# Patient Record
Sex: Female | Born: 1965 | Race: White | Hispanic: No | Smoking: Never smoker
Health system: Southern US, Community
[De-identification: ages and names within clinical notes are randomized; demographics above are authoritative.]

## PROBLEM LIST (undated history)

## (undated) DIAGNOSIS — M1990 Unspecified osteoarthritis, unspecified site: Secondary | ICD-10-CM

## (undated) DIAGNOSIS — G4730 Sleep apnea, unspecified: Secondary | ICD-10-CM

## (undated) DIAGNOSIS — T7840XA Allergy, unspecified, initial encounter: Secondary | ICD-10-CM

## (undated) DIAGNOSIS — F32A Depression, unspecified: Secondary | ICD-10-CM

## (undated) DIAGNOSIS — E079 Disorder of thyroid, unspecified: Secondary | ICD-10-CM

## (undated) DIAGNOSIS — F419 Anxiety disorder, unspecified: Secondary | ICD-10-CM

## (undated) DIAGNOSIS — E119 Type 2 diabetes mellitus without complications: Secondary | ICD-10-CM

## (undated) DIAGNOSIS — F329 Major depressive disorder, single episode, unspecified: Secondary | ICD-10-CM

## (undated) DIAGNOSIS — J45909 Unspecified asthma, uncomplicated: Secondary | ICD-10-CM

## (undated) HISTORY — DX: Depression, unspecified: F32.A

## (undated) HISTORY — DX: Unspecified osteoarthritis, unspecified site: M19.90

## (undated) HISTORY — PX: HERNIA REPAIR: SHX51

## (undated) HISTORY — PX: TUBAL LIGATION: SHX77

## (undated) HISTORY — DX: Anxiety disorder, unspecified: F41.9

## (undated) HISTORY — DX: Type 2 diabetes mellitus without complications: E11.9

## (undated) HISTORY — DX: Unspecified asthma, uncomplicated: J45.909

## (undated) HISTORY — PX: BREAST SURGERY: SHX581

## (undated) HISTORY — DX: Disorder of thyroid, unspecified: E07.9

## (undated) HISTORY — DX: Allergy, unspecified, initial encounter: T78.40XA

## (undated) HISTORY — DX: Sleep apnea, unspecified: G47.30

---
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
## 2001-03-16 HISTORY — PX: REDUCTION MAMMAPLASTY: SUR839

## 2016-03-16 DIAGNOSIS — E872 Acidosis: Secondary | ICD-10-CM

## 2016-03-16 DIAGNOSIS — E8729 Other acidosis: Secondary | ICD-10-CM

## 2016-03-16 HISTORY — DX: Acidosis: E87.2

## 2016-03-16 HISTORY — DX: Other acidosis: E87.29

## 2016-07-17 LAB — HM PAP SMEAR: HM Pap smear: NEGATIVE

## 2016-07-17 LAB — RESULTS CONSOLE HPV: CHL HPV: NEGATIVE

## 2016-07-17 LAB — HM MAMMOGRAPHY

## 2019-04-20 ENCOUNTER — Other Ambulatory Visit: Payer: Self-pay

## 2019-04-20 ENCOUNTER — Encounter: Payer: Self-pay | Admitting: Adult Health

## 2019-04-20 ENCOUNTER — Ambulatory Visit: Payer: Managed Care, Other (non HMO) | Admitting: Adult Health

## 2019-04-20 VITALS — BP 120/86 | HR 95 | Temp 96.9°F | Resp 16 | Ht 72.0 in | Wt 278.6 lb

## 2019-04-20 DIAGNOSIS — F339 Major depressive disorder, recurrent, unspecified: Secondary | ICD-10-CM

## 2019-04-20 DIAGNOSIS — R81 Glycosuria: Secondary | ICD-10-CM

## 2019-04-20 DIAGNOSIS — B353 Tinea pedis: Secondary | ICD-10-CM

## 2019-04-20 DIAGNOSIS — M79644 Pain in right finger(s): Secondary | ICD-10-CM

## 2019-04-20 DIAGNOSIS — Z794 Long term (current) use of insulin: Secondary | ICD-10-CM | POA: Diagnosis not present

## 2019-04-20 DIAGNOSIS — E1165 Type 2 diabetes mellitus with hyperglycemia: Secondary | ICD-10-CM | POA: Diagnosis not present

## 2019-04-20 DIAGNOSIS — Z6837 Body mass index (BMI) 37.0-37.9, adult: Secondary | ICD-10-CM

## 2019-04-20 DIAGNOSIS — E66812 Obesity, class 2: Secondary | ICD-10-CM

## 2019-04-20 DIAGNOSIS — Z920 Personal history of contraception: Secondary | ICD-10-CM

## 2019-04-20 DIAGNOSIS — E039 Hypothyroidism, unspecified: Secondary | ICD-10-CM

## 2019-04-20 DIAGNOSIS — Z0000 Encounter for general adult medical examination without abnormal findings: Secondary | ICD-10-CM | POA: Diagnosis not present

## 2019-04-20 LAB — POCT URINALYSIS DIPSTICK
Bilirubin, UA: NEGATIVE
Blood, UA: NEGATIVE
Glucose, UA: POSITIVE — AB
Ketones, UA: NEGATIVE
Leukocytes, UA: NEGATIVE
Nitrite, UA: NEGATIVE
Protein, UA: NEGATIVE
Spec Grav, UA: 1.01 (ref 1.010–1.025)
Urobilinogen, UA: 0.2 E.U./dL
pH, UA: 5 (ref 5.0–8.0)

## 2019-04-20 LAB — POCT GLYCOSYLATED HEMOGLOBIN (HGB A1C): Hemoglobin A1C: 13.1 % — AB (ref 4.0–5.6)

## 2019-04-20 LAB — POCT UA - MICROALBUMIN: Microalbumin Ur, POC: 50 mg/L

## 2019-04-20 MED ORDER — DULOXETINE HCL 60 MG PO CPEP: 60.0000 mg | ORAL_CAPSULE | Freq: Two times a day (BID) | ORAL | 0 refills | Status: DC

## 2019-04-20 MED ORDER — OZEMPIC (0.25 OR 0.5 MG/DOSE) 2 MG/1.5ML ~~LOC~~ SOPN: 0.2500 mg | PEN_INJECTOR | SUBCUTANEOUS | 0 refills | Status: DC

## 2019-04-20 MED ORDER — INSULIN GLARGINE 100 UNIT/ML ~~LOC~~ SOLN: 35.0000 [IU] | Freq: Two times a day (BID) | SUBCUTANEOUS | 0 refills | Status: DC

## 2019-04-20 MED ORDER — METFORMIN HCL 1000 MG PO TABS: 1000.0000 mg | ORAL_TABLET | Freq: Two times a day (BID) | ORAL | 0 refills | Status: DC

## 2019-04-20 MED ORDER — INSULIN SYRINGES (DISPOSABLE) U-100 1 ML MISC: 1.0000 | Freq: Three times a day (TID) | 0 refills | Status: DC | PRN

## 2019-04-20 MED ORDER — KETOCONAZOLE 2 % EX CREA: 1.0000 "application " | TOPICAL_CREAM | Freq: Every day | CUTANEOUS | 0 refills | Status: AC

## 2019-04-20 NOTE — Progress Notes (Signed)
Patient: Valerie Schmitt, Female    DOB: Sep 28, 1965, 54 y.o.   MRN: [id number] Visit Date: 04/20/2019  Today's Provider: Marcille Buffy, FNP   Chief Complaint  Patient presents with  . New Patient (Initial Visit)   Subjective:    New Patient Valerie Schmitt is a 54 y.o. female who presents today for health maintenance and establish care, patient is a previous patient of Dr. Juliet Rude at [facility]. She feels poorly, patient reports that her blood sugar readings at home have been high and states that her diabetes is not under control.This morning patient reports blood sugar was 582 She reports she is not actively exercising . She reports she is sleeping poorly.Patient reports that she averages 4-5 hours a night of sleep and waking up every hour.   She recently moved from [place] last year and has not established new care.   She took 30 of Lantus this morning. She took Ozempic on Sunday. She has Metformin and she has not taken it or Amaryl. 466 in office this am. Denies fruity breath, abdominal pain. She has not been eating well. She reports readings lowest 260- 400 at home. She reports she skips weeks at a time of medication.   Right pointer finger history she was told has a " joint cyst " on it and it is causing her pain. She reports she saw orthopedics in the past for it. She wants a referral to orthopedics at [facility].   She was hospitilized for ketoacidosis in  2018.   She had endocinologist - she was told Type 2 diabetes. She had Free style libre in past.  She has had a lot of stressors, she got divorced last year, her kids are grown and her husband left. She has been stressed. She denies any homiciadal or suicidal  thoughts or ideations. She had a counselor in [place]. She moved 06/2018. She just bought house in [place]. She is   She has been using athletes foot spray for itching yeast like area on her left foot for a month with no change.  Denies any exposures. History of athletes foot in past she reports.   Denies any recent PAP. IUD in place. No menstraul in 2 years. Denies any spotting or bleeding.  Denies any abnormal PAPS. She will send records.   Denies any rectal pain/ bleeding. - unknown date - repeat in 5 years had polyps.  She has been weeks she has not taken her medications at times for Diabetes  for diabetes as directed as been missing medications.  She has not taken her diabetes medicatiosn this week. Sh ehas one lantus pen left. She does not use her novolog.- she had pens in past. She has ozempic.   Depression/anxiety on Cymbalta - she has been off for three weeks and can tell her anxiety is increasing since stopping. She takes Cymbalta 60mg  BID.    Patient  denies any fever, body aches,chills, rash, chest pain, shortness of breath, nausea, vomiting, or diarrhea.   -----------------------------------------------------------------   Review of Systems  Constitutional: Positive for fatigue.  Endocrine: Positive for polydipsia and polyuria.  Musculoskeletal: Positive for arthralgias, back pain, joint swelling and neck pain.  Skin: Positive for rash (on left side of foot).  Neurological: Positive for headaches.  Psychiatric/Behavioral: Positive for decreased concentration. The patient is nervous/anxious.   All other systems reviewed and are negative.   Social History She   Social History   Socioeconomic  History  . Marital status: Divorced    Spouse name: Not on file  . Number of children: Not on file  . Years of education: Not on file  . Highest education level: Not on file  Occupational History  . Not on file  Tobacco Use  . Smoking status: Not on file  Substance and Sexual Activity  . Alcohol use: Not on file  . Drug use: Not on file  . Sexual activity: Not on file  Other Topics Concern  . Not on file  Social History Narrative  . Not on file   Social Determinants of Health   Financial  Resource Strain:   . Difficulty of Paying Living Expenses: Not on file  Food Insecurity:   . Worried About Charity fundraiser in the Last Year: Not on file  . Ran Out of Food in the Last Year: Not on file  Transportation Needs:   . Lack of Transportation (Medical): Not on file  . Lack of Transportation (Non-Medical): Not on file  Physical Activity:   . Days of Exercise per Week: Not on file  . Minutes of Exercise per Session: Not on file  Stress:   . Feeling of Stress : Not on file  Social Connections:   . Frequency of Communication with Friends and Family: Not on file  . Frequency of Social Gatherings with Friends and Family: Not on file  . Attends Religious Services: Not on file  . Active Member of Clubs or Organizations: Not on file  . Attends Archivist Meetings: Not on file  . Marital Status: Not on file    . Family History  Problem Relation Age of Onset  . Diabetes Father   . Scoliosis Daughter   . Autoimmune disease Daughter   . Sleep apnea Daughter   . Anxiety disorder Daughter   . Depression Daughter   . Personality disorder Daughter   . Osteoporosis Maternal Grandmother   . Alzheimer's disease Paternal Grandmother   . Liver disease Paternal Grandfather     Not on File  Previous Medications   No medications on file    Patient Care Team: Flinchum, Kelby Aline, FNP as PCP - General (Family Medicine)      Objective:   Blood pressure 120/86, pulse 95, temperature (!) 96.9 F (36.1 C), temperature source temporal , resp. rate 16, height 6' (1.829 m), weight 278 lb 9.6 oz (126.4 kg), SpO2 98 %.   Physical Exam Vitals reviewed.  Constitutional:      General: She is not in acute distress.    Appearance: Normal appearance. She is well-developed. She is obese. She is not ill-appearing, toxic-appearing or diaphoretic.     Interventions: She is not intubated. HENT:     Head: Normocephalic and atraumatic.     Right Ear: External ear normal.     Left  Ear: External ear normal.     Nose: Nose normal. No congestion or rhinorrhea.     Mouth/Throat:     Pharynx: No oropharyngeal exudate or posterior oropharyngeal erythema.  Eyes:     General: Lids are normal. No scleral icterus.       Right eye: No discharge.        Left eye: No discharge.     Extraocular Movements: Extraocular movements intact.     Conjunctiva/sclera: Conjunctivae normal.     Right eye: Right conjunctiva is not injected. No exudate or hemorrhage.    Left eye: Left conjunctiva is not injected. No  exudate or hemorrhage.    Pupils: Pupils are equal, round, and reactive to light.  Neck:     Thyroid: No thyroid mass or thyromegaly.     Vascular: Normal carotid pulses. No carotid bruit, hepatojugular reflux or JVD.     Trachea: Trachea and phonation normal. No tracheal tenderness or tracheal deviation.     Meningeal: Brudzinski's sign and Kernig's sign absent.  Cardiovascular:     Rate and Rhythm: Normal rate and regular rhythm.     Pulses: Normal pulses.          Radial pulses are 2+ on the right side and 2+ on the left side.       Dorsalis pedis pulses are 2+ on the right side and 2+ on the left side.       Posterior tibial pulses are 2+ on the right side and 2+ on the left side.     Heart sounds: Normal heart sounds, S1 normal and S2 normal. Heart sounds not distant. No murmur. No friction rub. No gallop.   Pulmonary:     Effort: Pulmonary effort is normal. No tachypnea, bradypnea, accessory muscle usage or respiratory distress. She is not intubated.     Breath sounds: Normal breath sounds. No stridor. No wheezing, rhonchi or rales.  Chest:     Chest wall: No tenderness.  Abdominal:     General: Bowel sounds are normal. There is no distension or abdominal bruit.     Palpations: Abdomen is soft. There is no shifting dullness, fluid wave, hepatomegaly, splenomegaly, mass or pulsatile mass.     Tenderness: There is no abdominal tenderness. There is no guarding or rebound.       Hernia: No hernia is present.  Genitourinary:    Comments: deferred to next visit.   Musculoskeletal:        General: No tenderness or deformity. Normal range of motion.     Cervical back: Full passive range of motion without pain, normal range of motion and neck supple. No edema, erythema or rigidity. No spinous process tenderness or muscular tenderness. Normal range of motion.  Lymphadenopathy:     Head:     Right side of head: No submental, submandibular, tonsillar, preauricular, posterior auricular or occipital adenopathy.     Left side of head: No submental, submandibular, tonsillar, preauricular, posterior auricular or occipital adenopathy.     Cervical: No cervical adenopathy.     Right cervical: No superficial, deep or posterior cervical adenopathy.    Left cervical: No superficial, deep or posterior cervical adenopathy.     Upper Body:     Right upper body: No supraclavicular or pectoral adenopathy.     Left upper body: No supraclavicular or pectoral adenopathy.  Skin:    General: Skin is warm and dry.     Capillary Refill: Capillary refill takes less than 2 seconds.     Coloration: Skin is not pale.     Findings: Rash present. No abrasion, bruising, burn, ecchymosis, erythema, lesion or petechiae.     Nails: There is no clubbing.     Comments: Dry flaky mild moist with  mild erythema to left lateral foot. No drainage. No temperature change.  Pulses normal.   Neurological:     General: No focal deficit present.     Mental Status: She is alert and oriented to person, place, and time.     GCS: GCS eye subscore is 4. GCS verbal subscore is 5. GCS motor subscore is 6.  Cranial Nerves: No cranial nerve deficit.     Sensory: No sensory deficit.     Motor: No weakness, tremor, atrophy, abnormal muscle tone or seizure activity.     Coordination: Coordination normal.     Gait: Gait normal.     Deep Tendon Reflexes: Reflexes are normal and symmetric. Reflexes normal. Babinski  sign absent on the right side. Babinski sign absent on the left side.     Reflex Scores:      Tricep reflexes are 2+ on the right side and 2+ on the left side.      Bicep reflexes are 2+ on the right side and 2+ on the left side.      Brachioradialis reflexes are 2+ on the right side and 2+ on the left side.      Patellar reflexes are 2+ on the right side and 2+ on the left side.      Achilles reflexes are 2+ on the right side and 2+ on the left side. Psychiatric:        Mood and Affect: Mood normal.        Speech: Speech normal.        Behavior: Behavior normal.        Thought Content: Thought content normal.        Judgment: Judgment normal.      Depression Screen No flowsheet data found.    Assessment & Plan:     Routine Health Maintenance and Physical Exam  Exercise Activities and Dietary recommendations Goals   None      There is no immunization history on file for this patient.  Health Maintenance  Topic Date Due  . HIV Screening  01/07/1982  . TETANUS/TDAP  01/07/1986  . PAP SMEAR-Modifier  01/08/1988  . MAMMOGRAM  01/07/2017  . COLONOSCOPY  01/07/2017  . INFLUENZA VACCINE  10/15/2018   Wants novolog pen at next refill. Needs syringes for the bottle she has that she says is in date. She was advised we should give her Novolog 5 additional units today but she declined and said she would take when she gets home.    Discussed health benefits of physical activity, and encouraged her to engage in regular exercise appropriate for her age and condition.   Yearly eye exam and dental exam recommended- gave resources.    1. Type 2 diabetes mellitus with hyperglycemia, with long-term current use of insulin ([place]) She has not been compliant with her medication regimen, not taking Amaryl, Metformin, or Lantus at all. Will discontinue Amaryl. She requests syringes for her Novolog, she has a bottle and never got syringes. She would like to switch to Novolog pen when that  runs our. She knows how to use and administer. Increased Lantus to 35 units twice daily. She has monitoring supplies. She will keep log of fasting blood glucose am, before meals and at bedtime and bring log to next visit in one week. Sooner if readings consistently over 350. Reviewed dangers of untreated hyperglycemia and when to seek care immediately for diabetic ketoacidosis. Signs of hypoglycemia also discussed.   Patient verbalized understanding of increasing her Lantus to 35 units twice daily, previous dose was 30 units twice daily.  She understands the importance of controlled diabetes and the risk of uncontrolled diabetes.  She is to call the office if she has any blood sugars that remain over 300.  She will keep a strict record.  And bring to the office in 1 week.  She will  go for labs.  Meds ordered this encounter  Medications  . DULoxetine (CYMBALTA) 60 MG capsule    Sig: Take 1 capsule (60 mg total) by mouth 2 (two) times daily.    Dispense:  120 capsule    Refill:  0  . insulin glargine (LANTUS) 100 UNIT/ML injection    Sig: Inject 0.35 mLs (35 Units total) into the skin 2 (two) times daily.    Dispense:  10 mL    Refill:  0  . Insulin Syringes, Disposable, U-100 1 ML MISC    Sig: 1 Bag by Does not apply route 3 (three) times daily between meals as needed.    Dispense:  100 each    Refill:  0  . Semaglutide,0.25 or 0.5MG /DOS, (OZEMPIC, 0.25 OR 0.5 MG/DOSE,) 2 MG/1.5ML SOPN    Sig: Inject 0.25 mg into the skin once a week.    Dispense:  1 pen    Refill:  0  . metFORMIN (GLUCOPHAGE) 1000 MG tablet    Sig: Take 1 tablet (1,000 mg total) by mouth 2 (two) times daily with a meal.    Dispense:  120 tablet    Refill:  0  . ketoconazole (NIZORAL) 2 % cream    Sig: Apply 1 application topically daily. To area on left foot.    Dispense:  30 g    Refill:  0    - POCT UA - Microalbumin - POCT urinalysis dipstick - POCT glycosylated hemoglobin (Hb A1C) - HgB A1c - CBC with  Differential/Platelet - Comprehensive Metabolic Panel (CMET) - TSH - Comprehensive Metabolic Panel (CMET) - Lipid Profile - C-peptide - DG Finger Index Right; Future - Ambulatory referral to Endocrinology - Ambulatory referral to diabetic education - Ambulatory referral to Orthopedic Surgery  2. Routine health maintenance Discussed heathy diet, increased activity. Exercise.   - Ambulatory referral to Psychiatry  3. Pain of finger of right hand Will x ray and she will follow back up with orthopedics - since [place] in past. Referral placed.  - DG Finger Index Right; Future - Ambulatory referral to Orthopedic Surgery  4. Glucose found in urine on examination Increase hydration of water, monitor diet, exercise.  No ketones.  - Ambulatory referral to Endocrinology - Ambulatory referral to diabetic education  5. Uncontrolled type 2 diabetes mellitus with hyperglycemia ([place]) As above discussed.  - DG Finger Index Right; Future - Ambulatory referral to Endocrinology  6. Depression, recurrent ([place]) Restart Cymbalta as previously was, has been off x 3 weeks. Discussed black box warnings and when to seek care. Needs divorce counseling.  - Ambulatory referral to diabetic education - Ambulatory referral to Psychiatry  7. Class 2 severe obesity due to excess calories with serious comorbidity and body mass index (BMI) of 37.0 to 37.9 in adult [facility]) As above lifestyle aggressive changes diet and excercises - Ambulatory referral to Endocrinology - Ambulatory referral to diabetic education  8. Tinea pedis of left foot Ketoconazole cream given stop athletes spray. Return if not improving in 3- 4 weeks.   9. Hypothyroidism, unspecified type Will check TSH and refill medication after labs .  10. History of use of contraceptive intrauterine device (IUD) Need records she is unsure what she has and also when last PAP was - records release obtained.    Return in about 1  week (around 04/27/2019), or if symptoms worsen or fail to improve, for at any time for any worsening symptoms, Go to Emergency room/ urgent  care if worse. Bring blood glucose logs. Call per parameters given if after hours go to the ER or if any signs of ketoacidosis go directly to ER.   Advised patient call the office or your primary care doctor for an appointment if no improvement within 72 hours or if any symptoms change or worsen at any time  Advised ER or urgent Care if after hours or on weekend. Call 911 for emergency symptoms at any time.Patinet verbalized understanding of all instructions given/reviewed and treatment plan and has no further questions or concerns at this time.    Discussed plan with Dr. Miguel Aschoff supervising physician, who is in agreement with plan and medication changes as above. --------------------------------------------------------------------

## 2019-04-20 NOTE — Addendum Note (Signed)
Addended by: Doreen Beam on: 04/20/2019 06:54 PM   Modules accepted: Level of Service

## 2019-04-20 NOTE — Patient Instructions (Signed)
Preventing Diabetic Ketoacidosis Diabetic ketoacidosis (DKA) is a life-threatening complication of diabetes (diabetes mellitus). It develops when there is not enough of a hormone called insulin in the body. If the body does not have enough insulin, it cannot divide (break down) sugar (glucose) into usable cells, so it breaks down fats instead. This leads to the production of acids (ketones), which can cause the blood to have too much acid in it (acidosis). DKA is a medical emergency that must be treated at the hospital. You may be more likely to develop DKA if you have type 1 diabetes and you take insulin. You can prevent DKA by working closely with your health care provider to manage your diabetes. What nutrition changes can be made?   Follow your meal plan, as directed by your health care provider or diet and nutrition specialist (dietitian).  Eat healthy meals at about the same time every day. Have healthy snacks between meals.  Avoid not eating for long periods of time. Do not skip meals, especially if you are ill.  Avoid regularly eating foods that contain a lot of sugar. Also avoid drinking alcohol. Sugary food and alcohol increase your risk of high blood glucose (hyperglycemia), which increases your risk for DKA.  Drink enough fluid to keep your urine pale yellow. Dehydration increases your risk for DKA. What actions can I take to lower my risk? To lower your risk for diabetic ketoacidosis, manage your diabetes as directed by your health care provider:  Take insulin and other diabetes medicines as directed.  Check your blood glucose every day, as often as directed.  Follow your sick day plan whenever you cannot eat or drink as usual. Make this plan in advance with your health care provider.  Check your urine for ketones as often as directed. ? During times when you are sick, check your ketones every 4-6 hours. ? If you develop symptoms of DKA, check your ketones right away.  If you  have ketones in your urine: ? Contact your health care provider right away. ? Do not exercise.  Know the symptoms of DKA so that you can get treatment as soon as possible.  Make sure that people at work, home, and school know how to check your blood glucose, in case you are not able to do it yourself.  Carry a medical alert card or wear medical alert jewelry that says that you have diabetes. Why are these changes important? DKA is a warning sign that your diabetes is not being well-controlled. You may need to work with your health care provider to adjust your diabetes management plan. DKA can lead to a serious medical emergency that can be life-threatening. Where to find support For more support with preventing DKA:  Talk with your health care provider.  Consider joining a support group. The American Diabetes Association has an online support community at: community.diabetes.org/home Where to find more information Learn more about preventing DKA from:  American Diabetes Association: www.diabetes.org  American Heart Association: www.heart.org Contact a health care provider if: You develop symptoms of DKA, such as:  Fatigue.  Weight loss.  Excessive thirst.  Light-headedness.  Fruity or sweet-smelling breath.  Excessive urination.  Vision changes.  Confusion or irritability.  Nausea.  Vomiting.  Rapid breathing.  Pain in the abdomen.  Feeling warm in your face (flushed). This may or may not include a reddish color coming to your face. If you develop any of these symptoms, do not wait to see if the symptoms will  go away. Get medical help right away. Call your local emergency services (911 in the [place] Do not drive yourself to the hospital. Summary  DKA may be a warning sign that your diabetes is not being well-controlled. You may need to work with your health care provider to adjust your diabetes management plan.  Preventing high blood glucose and dehydration  helps prevent DKA.  Check your urine for ketones as often as directed. You may need to check more often when your blood glucose level is high and when you are ill.  DKA is a medical emergency. Make sure you know the symptoms so that you can recognize and get treatment right away. This information is not intended to replace advice given to you by your health care provider. Make sure you discuss any questions you have with your health care provider. Document Revised: 06/24/2018 Document Reviewed: 10/01/2016 Elsevier Patient Education  [place]. Diabetic Ketoacidosis Diabetic ketoacidosis is a serious complication of diabetes. This condition develops when there is not enough insulin in the body. Insulin is an hormone that regulates blood sugar levels in the body. Normally, insulin allows glucose to enter the cells in the body. The cells break down glucose for energy. Without enough insulin, the body cannot break down glucose, so it breaks down fats instead. This leads to high blood glucose levels in the body and the production of acids that are called ketones. Ketones are poisonous at high levels. If diabetic ketoacidosis is not treated, it can cause severe dehydration and can lead to a coma or death. What are the causes? This condition develops when a lack of insulin causes the body to break down fats instead of glucose. This may be triggered by:  Stress on the body. This stress is brought on by an illness.  Infection.  Medicines that raise blood glucose levels.  Not taking diabetes medicine.  New onset of type 1 diabetes mellitus. What are the signs or symptoms? Symptoms of this condition include:  Fatigue.  Weight loss.  Excessive thirst.  Light-headedness.  Fruity or sweet-smelling breath.  Excessive urination.  Vision changes.  Confusion or irritability.  Nausea.  Vomiting.  Rapid breathing.  Abdominal pain.  Feeling flushed. How is this diagnosed? This  condition is diagnosed based on your medical history, a physical exam, and blood tests. You may also have a urine test to check for ketones. How is this treated? This condition may be treated with:  Fluid replacement. This may be done to correct dehydration.  Insulin injections. These may be given through the skin or through an IV tube.  Electrolyte replacement. Electrolytes are minerals in your blood. Electrolytes such as potassium and sodium may be given in pill form or through an IV tube.  Antibiotic medicines. These may be prescribed if your condition was caused by an infection. Diabetic ketoacidosis is a serious medical condition. You may need emergency treatment in the hospital to monitor your condition. Follow these instructions at home: Eating and drinking  Drink enough fluids to keep your urine clear or pale yellow.  If you are not able to eat, drink clear fluids in small amounts as you are able. Clear fluids include water, ice chips, fruit juice with water added (diluted), and low-calorie sports drinks. You may also have sugar-free jello or popsicles.  If you are able to eat, follow your usual diet and drink sugar-free liquids, such as water. Medicines  Take over-the-counter and prescription medicines only as told by your health care  provider.  Continue to take insulin and other diabetes medicines as told by your health care provider.  If you were prescribed an antibiotic, take it as told by your health care provider. Do not stop taking the antibiotic even if you start to feel better. General instructions   Check your urine for ketones when you are ill and as told by your health care provider. ? If your blood glucose is 240 mg/dL (13.3 mmol/L) or higher, check your urine ketones every 4-6 hours.  Check your blood glucose every day, as often as told by your health care provider. ? If your blood glucose is high, drink plenty of fluids. This helps to flush out ketones. ? If  your blood glucose is above your target for 2 tests in a row, contact your health care provider.  Carry a medical alert card or wear medical alert jewelry that says that you have diabetes.  Rest and exercise only as told by your health care provider. Do not exercise when your blood glucose is high and you have ketones in your urine.  If you get sick, call your health care provider and begin treatment quickly. Your body often needs extra insulin to fight an illness. Check your blood glucose every 4-6 hours when you are sick.  Keep all follow-up visits as told by your health care provider. This is important. Contact a health care provider if:  Your blood glucose level is higher than 240 mg/dL (13.3 mmol/L) for 2 days in a row.  You have moderate or large ketones in your urine.  You have a fever.  You cannot eat or drink without vomiting.  You have been vomiting for more than 2 hours.  You continue to have symptoms of diabetic ketoacidosis.  You develop new symptoms. Get help right away if:  Your blood glucose monitor reads "high" even when you are taking insulin.  You faint.  You have chest pain.  You have trouble breathing.  You have sudden trouble speaking or swallowing.  You have vomiting or diarrhea that gets worse after 3 hours.  You are unable to stay awake.  You have trouble thinking.  You are severely dehydrated. Symptoms of severe dehydration include: ? Extreme thirst. ? Dry mouth. ? Rapid breathing. These symptoms may represent a serious problem that is an emergency. Do not wait to see if the symptoms will go away. Get medical help right away. Call your local emergency services (911 in the [place] Do not drive yourself to the hospital. Summary  Diabetic ketoacidosis is a serious complication of diabetes. This condition develops when there is not enough insulin in the body.  This condition is diagnosed based on your medical history, a physical exam, and blood  tests. You may also have a urine test to check for ketones.  Diabetic ketoacidosis is a serious medical condition. You may need emergency treatment in the hospital to monitor your condition.  Contact your health care provider if your blood glucose is higher than 240 mg/dl for 2 days in a row or if you have moderate or large ketones in your urine. This information is not intended to replace advice given to you by your health care provider. Make sure you discuss any questions you have with your health care provider. Document Revised: 04/17/2016 Document Reviewed: 04/06/2016 Elsevier Patient Education  [place]. Diabetes Mellitus and Nutrition, Adult When you have diabetes (diabetes mellitus), it is very important to have healthy eating habits because your blood sugar (glucose)  levels are greatly affected by what you eat and drink. Eating healthy foods in the appropriate amounts, at about the same times every day, can help you:  Control your blood glucose.  Lower your risk of heart disease.  Improve your blood pressure.  Reach or maintain a healthy weight. Every person with diabetes is different, and each person has different needs for a meal plan. Your health care provider may recommend that you work with a diet and nutrition specialist (dietitian) to make a meal plan that is best for you. Your meal plan may vary depending on factors such as:  The calories you need.  The medicines you take.  Your weight.  Your blood glucose, blood pressure, and cholesterol levels.  Your activity level.  Other health conditions you have, such as heart or kidney disease. How do carbohydrates affect me? Carbohydrates, also called carbs, affect your blood glucose level more than any other type of food. Eating carbs naturally raises the amount of glucose in your blood. Carb counting is a method for keeping track of how many carbs you eat. Counting carbs is important to keep your blood glucose at a  healthy level, especially if you use insulin or take certain oral diabetes medicines. It is important to know how many carbs you can safely have in each meal. This is different for every person. Your dietitian can help you calculate how many carbs you should have at each meal and for each snack. Foods that contain carbs include:  Bread, cereal, rice, pasta, and crackers.  Potatoes and corn.  Peas, beans, and lentils.  Milk and yogurt.  Fruit and juice.  Desserts, such as cakes, cookies, ice cream, and candy. How does alcohol affect me? Alcohol can cause a sudden decrease in blood glucose (hypoglycemia), especially if you use insulin or take certain oral diabetes medicines. Hypoglycemia can be a life-threatening condition. Symptoms of hypoglycemia (sleepiness, dizziness, and confusion) are similar to symptoms of having too much alcohol. If your health care provider says that alcohol is safe for you, follow these guidelines:  Limit alcohol intake to no more than 1 drink per day for nonpregnant women and 2 drinks per day for men. One drink equals 12 oz of beer, 5 oz of wine, or 1 oz of hard liquor.  Do not drink on an empty stomach.  Keep yourself hydrated with water, diet soda, or unsweetened iced tea.  Keep in mind that regular soda, juice, and other mixers may contain a lot of sugar and must be counted as carbs. What are tips for following this plan?  Reading food labels  Start by checking the serving size on the "Nutrition Facts" label of packaged foods and drinks. The amount of calories, carbs, fats, and other nutrients listed on the label is based on one serving of the item. Many items contain more than one serving per package.  Check the total grams (g) of carbs in one serving. You can calculate the number of servings of carbs in one serving by dividing the total carbs by 15. For example, if a food has 30 g of total carbs, it would be equal to 2 servings of carbs.  Check the  number of grams (g) of saturated and trans fats in one serving. Choose foods that have low or no amount of these fats.  Check the number of milligrams (mg) of salt (sodium) in one serving. Most people should limit total sodium intake to less than 2,300 mg per day.  Always  check the nutrition information of foods labeled as "low-fat" or "nonfat". These foods may be higher in added sugar or refined carbs and should be avoided.  Talk to your dietitian to identify your daily goals for nutrients listed on the label. Shopping  Avoid buying canned, premade, or processed foods. These foods tend to be high in fat, sodium, and added sugar.  Shop around the outside edge of the grocery store. This includes fresh fruits and vegetables, bulk grains, fresh meats, and fresh dairy. Cooking  Use low-heat cooking methods, such as baking, instead of high-heat cooking methods like deep frying.  Cook using healthy oils, such as olive, canola, or sunflower oil.  Avoid cooking with butter, cream, or high-fat meats. Meal planning  Eat meals and snacks regularly, preferably at the same times every day. Avoid going long periods of time without eating.  Eat foods high in fiber, such as fresh fruits, vegetables, beans, and whole grains. Talk to your dietitian about how many servings of carbs you can eat at each meal.  Eat 4-6 ounces (oz) of lean protein each day, such as lean meat, chicken, fish, eggs, or tofu. One oz of lean protein is equal to: ? 1 oz of meat, chicken, or fish. ? 1 egg. ?  cup of tofu.  Eat some foods each day that contain healthy fats, such as avocado, nuts, seeds, and fish. Lifestyle  Check your blood glucose regularly.  Exercise regularly as told by your health care provider. This may include: ? 150 minutes of moderate-intensity or vigorous-intensity exercise each week. This could be brisk walking, biking, or water aerobics. ? Stretching and doing strength exercises, such as yoga or  weightlifting, at least 2 times a week.  Take medicines as told by your health care provider.  Do not use any products that contain nicotine or tobacco, such as cigarettes and e-cigarettes. If you need help quitting, ask your health care provider.  Work with a Social worker or diabetes educator to identify strategies to manage stress and any emotional and social challenges. Questions to ask a health care provider  Do I need to meet with a diabetes educator?  Do I need to meet with a dietitian?  What number can I call if I have questions?  When are the best times to check my blood glucose? Where to find more information:  American Diabetes Association: diabetes.org  Academy of Nutrition and Dietetics: www.eatright.[employer] of Diabetes and Digestive and Kidney Diseases (NIH): [employer] Summary  A healthy meal plan will help you control your blood glucose and maintain a healthy lifestyle.  Working with a diet and nutrition specialist (dietitian) can help you make a meal plan that is best for you.  Keep in mind that carbohydrates (carbs) and alcohol have immediate effects on your blood glucose levels. It is important to count carbs and to use alcohol carefully. This information is not intended to replace advice given to you by your health care provider. Make sure you discuss any questions you have with your health care provider. Document Revised: 02/12/2017 Document Reviewed: 04/06/2016 Elsevier Patient Education  2020 [place]. Diabetes Mellitus and Exercise Exercising regularly is important for your overall health, especially when you have diabetes (diabetes mellitus). Exercising is not only about losing weight. It has many other health benefits, such as increasing muscle strength and bone density and reducing body fat and stress. This leads to improved fitness, flexibility, and endurance, all of which result in better overall health. Exercise has  additional  benefits for people with diabetes, including:  Reducing appetite.  Helping to lower and control blood glucose.  Lowering blood pressure.  Helping to control amounts of fatty substances (lipids) in the blood, such as cholesterol and triglycerides.  Helping the body to respond better to insulin (improving insulin sensitivity).  Reducing how much insulin the body needs.  Decreasing the risk for heart disease by: ? Lowering cholesterol and triglyceride levels. ? Increasing the levels of good cholesterol. ? Lowering blood glucose levels. What is my activity plan? Your health care provider or certified diabetes educator can help you make a plan for the type and frequency of exercise (activity plan) that works for you. Make sure that you:  Do at least 150 minutes of moderate-intensity or vigorous-intensity exercise each week. This could be brisk walking, biking, or water aerobics. ? Do stretching and strength exercises, such as yoga or weightlifting, at least 2 times a week. ? Spread out your activity over at least 3 days of the week.  Get some form of physical activity every day. ? Do not go more than 2 days in a row without some kind of physical activity. ? Avoid being inactive for more than 30 minutes at a time. Take frequent breaks to walk or stretch.  Choose a type of exercise or activity that you enjoy, and set realistic goals.  Start slowly, and gradually increase the intensity of your exercise over time. What do I need to know about managing my diabetes?   Check your blood glucose before and after exercising. ? If your blood glucose is 240 mg/dL (13.3 mmol/L) or higher before you exercise, check your urine for ketones. If you have ketones in your urine, do not exercise until your blood glucose returns to normal. ? If your blood glucose is 100 mg/dL (5.6 mmol/L) or lower, eat a snack containing 15-20 grams of carbohydrate. Check your blood glucose 15 minutes after the snack to  make sure that your level is above 100 mg/dL (5.6 mmol/L) before you start your exercise.  Know the symptoms of low blood glucose (hypoglycemia) and how to treat it. Your risk for hypoglycemia increases during and after exercise. Common symptoms of hypoglycemia can include: ? Hunger. ? Anxiety. ? Sweating and feeling clammy. ? Confusion. ? Dizziness or feeling light-headed. ? Increased heart rate or palpitations. ? Blurry vision. ? Tingling or numbness around the mouth, lips, or tongue. ? Tremors or shakes. ? Irritability.  Keep a rapid-acting carbohydrate snack available before, during, and after exercise to help prevent or treat hypoglycemia.  Avoid injecting insulin into areas of the body that are going to be exercised. For example, avoid injecting insulin into: ? The arms, when playing tennis. ? The legs, when jogging.  Keep records of your exercise habits. Doing this can help you and your health care provider adjust your diabetes management plan as needed. Write down: ? Food that you eat before and after you exercise. ? Blood glucose levels before and after you exercise. ? The type and amount of exercise you have done. ? When your insulin is expected to peak, if you use insulin. Avoid exercising at times when your insulin is peaking.  When you start a new exercise or activity, work with your health care provider to make sure the activity is safe for you, and to adjust your insulin, medicines, or food intake as needed.  Drink plenty of water while you exercise to prevent dehydration or heat stroke. Drink enough fluid to  keep your urine clear or pale yellow. Summary  Exercising regularly is important for your overall health, especially when you have diabetes (diabetes mellitus).  Exercising has many health benefits, such as increasing muscle strength and bone density and reducing body fat and stress.  Your health care provider or certified diabetes educator can help you make a  plan for the type and frequency of exercise (activity plan) that works for you.  When you start a new exercise or activity, work with your health care provider to make sure the activity is safe for you, and to adjust your insulin, medicines, or food intake as needed. This information is not intended to replace advice given to you by your health care provider. Make sure you discuss any questions you have with your health care provider. Document Revised: 09/24/2016 Document Reviewed: 08/12/2015 Elsevier Patient Education  [place].

## 2019-04-21 ENCOUNTER — Telehealth: Payer: Self-pay | Admitting: Adult Health

## 2019-04-21 ENCOUNTER — Other Ambulatory Visit: Payer: Self-pay

## 2019-04-21 ENCOUNTER — Encounter: Payer: Self-pay | Admitting: Adult Health

## 2019-04-21 LAB — CBC WITH DIFFERENTIAL/PLATELET
Basophils Absolute: 0.1 10*3/uL (ref 0.0–0.2)
Basos: 2 %
EOS (ABSOLUTE): 0.3 10*3/uL (ref 0.0–0.4)
Eos: 4 %
Hematocrit: 48.6 % — ABNORMAL HIGH (ref 34.0–46.6)
Hemoglobin: 17 g/dL — ABNORMAL HIGH (ref 11.1–15.9)
Immature Grans (Abs): 0 10*3/uL (ref 0.0–0.1)
Immature Granulocytes: 1 %
Lymphocytes Absolute: 1.8 10*3/uL (ref 0.7–3.1)
Lymphs: 29 %
MCH: 29.7 pg (ref 26.6–33.0)
MCHC: 35 g/dL (ref 31.5–35.7)
MCV: 85 fL (ref 79–97)
Monocytes Absolute: 0.4 10*3/uL (ref 0.1–0.9)
Monocytes: 6 %
Neutrophils Absolute: 3.6 10*3/uL (ref 1.4–7.0)
Neutrophils: 58 %
Platelets: 255 10*3/uL (ref 150–450)
RBC: 5.72 x10E6/uL — ABNORMAL HIGH (ref 3.77–5.28)
RDW: 12.6 % (ref 11.7–15.4)
WBC: 6.2 10*3/uL (ref 3.4–10.8)

## 2019-04-21 LAB — COMPREHENSIVE METABOLIC PANEL
ALT: 31 IU/L (ref 0–32)
AST: 21 IU/L (ref 0–40)
Albumin/Globulin Ratio: 1.6 (ref 1.2–2.2)
Albumin: 4.4 g/dL (ref 3.8–4.9)
Alkaline Phosphatase: 90 IU/L (ref 39–117)
BUN/Creatinine Ratio: 20 (ref 9–23)
BUN: 14 mg/dL (ref 6–24)
Bilirubin Total: 0.6 mg/dL (ref 0.0–1.2)
CO2: 18 mmol/L — ABNORMAL LOW (ref 20–29)
Calcium: 9.5 mg/dL (ref 8.7–10.2)
Chloride: 97 mmol/L (ref 96–106)
Creatinine, Ser: 0.71 mg/dL (ref 0.57–1.00)
GFR calc Af Amer: 113 mL/min/{1.73_m2} (ref >59)
GFR calc non Af Amer: 98 mL/min/{1.73_m2} (ref >59)
Globulin, Total: 2.7 g/dL (ref 1.5–4.5)
Glucose: 433 mg/dL — ABNORMAL HIGH (ref 65–99)
Potassium: 4.4 mmol/L (ref 3.5–5.2)
Sodium: 135 mmol/L (ref 134–144)
Total Protein: 7.1 g/dL (ref 6.0–8.5)

## 2019-04-21 LAB — HEMOGLOBIN A1C
Est. average glucose Bld gHb Est-mCnc: 324 mg/dL
Hgb A1c MFr Bld: 12.9 % — ABNORMAL HIGH (ref 4.8–5.6)

## 2019-04-21 LAB — LIPID PANEL
Chol/HDL Ratio: 8.5 ratio — ABNORMAL HIGH (ref 0.0–4.4)
Cholesterol, Total: 305 mg/dL — ABNORMAL HIGH (ref 100–199)
HDL: 36 mg/dL — ABNORMAL LOW (ref >39)
Triglycerides: 885 mg/dL (ref 0–149)

## 2019-04-21 LAB — C-PEPTIDE: C-Peptide: 3.9 ng/mL (ref 1.1–4.4)

## 2019-04-21 LAB — TSH: TSH: 2.81 u[IU]/mL (ref 0.450–4.500)

## 2019-04-21 NOTE — Progress Notes (Signed)
Hemoglobin A1c is 12.9, diabetes is out of control with a average fasting glucose over the last 3 months of 324.  Glucose  433. CBC shows Hemoglobin and hematocrit are increased, please verify if patient is taking any supplement with iron in it such as vitamin,-or shakes that may have high iron content?  If so she should stop them and we should repeat this in 1 month.  Question Any history of  elevated hemoglobin in the past.? CMP shows electrolytes within normal range, kidney and liver function within normal range. Thyroid is within range.  Her triglycerides are out of control elevated at 885, this is due to likely to diet and uncontrolled diabetes.  Total cholesterol is 305 due to elevated triglycerides.  HDL good cholesterol is low at 36. Again must emphasize that she must follow a strict diabetic diet, increase exercise daily at least 30 minutes, as well as avoid high cholesterol foods, high saturated foods, starchy carbohydrates, sugary foods and drinks.  Wine, breads and other starches can increase your triglycerides as well.  I can prescribe a prescription fish oil to help lower triglycerides, this is an option for her since her triglycerides are this high.  We can also try diet and exercise, as well as seeing the nutritionist that I referred her to.  Endocrinology referral was also placed.  Keep plan as discussed in office with medications.  Follow-up in 1 week with a record of blood sugars fasting am  and prior to meals.  High triglycerides can lead to pancreatitis, it is very important that we get this number down.  I have added on a direct LDL so that I will know what her bad cholesterol is as well as a amylase and lipase to rule out any pancreatic issues at this time.  Results should be back next week.  I also sent some information to my chart regarding diet for triglycerides and diabetes. Also released this message to my chart.

## 2019-04-21 NOTE — Telephone Encounter (Signed)
Erroneous encounter - opened in error

## 2019-04-22 LAB — AMYLASE: Amylase: 47 U/L (ref 31–110)

## 2019-04-22 LAB — SPECIMEN STATUS REPORT

## 2019-04-22 LAB — LDL CHOLESTEROL, DIRECT: LDL Direct: 133 mg/dL — ABNORMAL HIGH (ref 0–99)

## 2019-04-22 LAB — LIPASE: Lipase: 39 U/L (ref 14–72)

## 2019-04-24 NOTE — Progress Notes (Signed)
Direct LDL is elevated. Amylase and Lipase are normal.  Total cholesterol and LDL elevated.  Discuss lifestyle modification with patient e.g. increase exercise, fiber, fruits, vegetables, lean meat, and omega 3/fish intake and decrease saturated fat.  If patient following strict diet and exercise program already please schedule follow up appointment with primary care physician Continue per instructions at last visit and last call. Recheck labs as directed.

## 2019-04-25 ENCOUNTER — Other Ambulatory Visit: Payer: Self-pay

## 2019-04-25 ENCOUNTER — Ambulatory Visit: Payer: Managed Care, Other (non HMO) | Admitting: Internal Medicine

## 2019-04-25 ENCOUNTER — Encounter: Payer: Self-pay | Admitting: Internal Medicine

## 2019-04-25 VITALS — BP 118/74 | HR 93 | Temp 97.7°F | Ht 72.0 in | Wt 279.8 lb

## 2019-04-25 DIAGNOSIS — E782 Mixed hyperlipidemia: Secondary | ICD-10-CM | POA: Diagnosis not present

## 2019-04-25 DIAGNOSIS — E1165 Type 2 diabetes mellitus with hyperglycemia: Secondary | ICD-10-CM | POA: Diagnosis not present

## 2019-04-25 DIAGNOSIS — Z794 Long term (current) use of insulin: Secondary | ICD-10-CM

## 2019-04-25 LAB — GLUCOSE, POCT (MANUAL RESULT ENTRY): POC Glucose: 172 mg/dl — AB (ref 70–99)

## 2019-04-25 NOTE — Patient Instructions (Addendum)
-   Continue Lantus at 35 units twice daily - Continue Novolog at 5 units with each meal    - Avoid snacking when you can  - Please bring meter on next visit       Choose healthy, lower carb lower calorie snacks: toss salad, vegetables, cottage cheese, peanut butter, low fat cheese / string cheese, lower sodium deli meat, tuna salad or chicken salad     HOW TO TREAT LOW BLOOD SUGARS (Blood sugar LESS THAN 70 MG/DL)  Please follow the RULE OF 15 for the treatment of hypoglycemia treatment (when your (blood sugars are less than 70 mg/dL)    STEP 1: Take 15 grams of carbohydrates when your blood sugar is low, which includes:   3-4 GLUCOSE TABS  OR  3-4 OZ OF JUICE OR REGULAR SODA OR  ONE TUBE OF GLUCOSE GEL     STEP 2: RECHECK blood sugar in 15 MINUTES STEP 3: If your blood sugar is still low at the 15 minute recheck --> then, go back to STEP 1 and treat AGAIN with another 15 grams of carbohydrates.

## 2019-04-25 NOTE — Progress Notes (Signed)
Name: Valerie Schmitt  MRN/ DOB: [id number], 1966-02-04   Age/ Sex: 54 y.o., female    PCP: Doreen Beam, FNP   Reason for Endocrinology Evaluation: Type 2 Diabetes Mellitus     Date of Initial Endocrinology Visit: 04/26/2019     PATIENT IDENTIFIER: [name] is a 54 y.o. female with a past medical history of T2DM and dyslipidemia. The patient presented for initial endocrinology clinic visit on 04/26/2019 for consultative assistance with her diabetes management.    HPI: Valerie Schmitt moved from [place] 06/2018   Diagnosed with T2DM 2016 Prior Medications tried/Intolerance:  Glimepiride Currently checking blood sugars 3-4 x / day,  Started recently   Hypoglycemia episodes : no         Hemoglobin A1c was 12.9% in 04/2019 Patient required assistance for hypoglycemia: no Patient has required hospitalization within the last 1 year from hyper or hypoglycemia: no   She has a diagnosis of DKA in 2018  In terms of diet, the patient eats 3 meals and 2-3 snacks.    HOME DIABETES REGIMEN: Metformin 1000 mg BID Ozempic 0.5 mg weekly  Lantus 35 units BID Novolog 5 units TIDQAC   Statin: yes ACE-I/ARB:no  Prior Diabetic Education: yes    METER DOWNLOAD SUMMARY: Did not bring    DIABETIC COMPLICATIONS: Microvascular complications:    Denies: CKD, neuropathy, retinopathy   Last eye exam: Completed 01/2019  Macrovascular complications:    Denies: CAD, PVD, CVA   PAST HISTORY: Past Medical History:  Past Medical History:  Diagnosis Date  . Allergy   . Anxiety   . Arthritis   . Asthma   . Depression   . Diabetes mellitus without complication ([employer])   . Ketoacidosis 03/2016  . Sleep apnea   . Thyroid disease    Past Surgical History:  Past Surgical History:  Procedure Laterality Date  . ABDOMINAL HYSTERECTOMY    . BREAST SURGERY    . HERNIA REPAIR    . TUBAL LIGATION        Social History:  reports that she has never smoked. She  has never used smokeless tobacco. She reports previous alcohol use. She reports that she does not use drugs. Family History:  Family History  Problem Relation Age of Onset  . Diabetes Father   . Scoliosis Daughter   . Autoimmune disease Daughter   . Sleep apnea Daughter   . Anxiety disorder Daughter   . Depression Daughter   . Personality disorder Daughter   . Osteoporosis Maternal Grandmother   . Alzheimer's disease Paternal Grandmother   . Liver disease Paternal Grandfather      HOME MEDICATIONS: Allergies as of 04/25/2019   No Known Allergies     Medication List       Accurate as of April 25, 2019 11:59 PM. If you have any questions, ask your nurse or doctor.        atorvastatin 80 MG tablet Commonly known as: LIPITOR Take 80 mg by mouth every evening.   DOXYCYCLINE PO Take by mouth.   DULoxetine 60 MG capsule Commonly known as: CYMBALTA Take 1 capsule (60 mg total) by mouth 2 (two) times daily.   insulin glargine 100 UNIT/ML injection Commonly known as: LANTUS Inject 0.35 mLs (35 Units total) into the skin 2 (two) times daily.   Insulin Syringes (Disposable) U-100 1 ML Misc 1 Bag by Does not apply route 3 (three) times daily between meals as needed.   ketoconazole 2 %  cream Commonly known as: NIZORAL Apply 1 application topically daily. To area on left foot.   levothyroxine 88 MCG tablet Commonly known as: SYNTHROID Take 88 mcg by mouth daily.   meloxicam 15 MG tablet Commonly known as: MOBIC Take 15 mg by mouth daily.   metFORMIN 1000 MG tablet Commonly known as: GLUCOPHAGE Take 1 tablet (1,000 mg total) by mouth 2 (two) times daily with a meal.   NOVOLOG [place] Inject 5 Units into the skin. Before meals   OneTouch UltraLink w/Device Kit by Does not apply route 3 (three) times daily.   Ozempic (0.25 or 0.5 MG/DOSE) 2 MG/1.5ML Sopn Generic drug: Semaglutide(0.25 or 0.5MG/DOS) Inject 0.25 mg into the skin once a week.        ALLERGIES: No  Known Allergies   REVIEW OF SYSTEMS: A comprehensive ROS was conducted with the patient and is negative except as per HPI and below:  Review of Systems  Gastrointestinal: Negative for diarrhea and nausea.  Neurological: Negative for tingling and tremors.      OBJECTIVE:   VITAL SIGNS: BP 118/74 (BP Location: Right Arm, Patient Position: Sitting, Cuff Size: Large)   Pulse 93   Temp 97.7 F (36.5 C)   Ht 6' (1.829 m)   Wt 279 lb 12.8 oz (126.9 kg)   SpO2 97%   BMI 37.95 kg/m    PHYSICAL EXAM:  General: Pt appears well and is in NAD  HEENT:  Eyes: External eye exam normal without stare, lid lag or exophthalmos.  EOM intact.    Neck: General: Supple without adenopathy or carotid bruits. Thyroid: Thyroid size normal.  No goiter or nodules appreciated. No thyroid bruit.  Lungs: Clear with good BS bilat with no rales, rhonchi, or wheezes  Heart: RRR with normal S1 and S2 and no gallops; no murmurs; no rub  Abdomen: Normoactive bowel sounds, soft, nontender, without masses or organomegaly palpable  Extremities:  Lower extremities - No pretibial edema. No lesions.  Skin: Normal texture and temperature to palpation. No rash noted. No Acanthosis nigricans/skin tags. No lipohypertrophy.  Neuro: MS is good with appropriate affect, pt is alert and Ox3    DM foot exam: 04/25/2019  The skin of the feet is shows 2 superficial cuts at the plantar surface of the left 2nd and right 2 toes.  The pedal pulses are 2+ on right and 2+ on left. The sensation is intact to a screening 5.07, 10 gram monofilament bilaterally    DATA REVIEWED:  Lab Results  Component Value Date   HGBA1C 12.9 (H) 04/20/2019   HGBA1C 13.1 (A) 04/20/2019   Lab Results  Component Value Date   MICROALBUR 50 04/20/2019   LDLCALC Comment (A) 04/20/2019   CREATININE 0.71 04/20/2019    Lab Results  Component Value Date   CHOL 305 (H) 04/20/2019   HDL 36 (L) 04/20/2019   LDLCALC Comment (A) 04/20/2019    LDLDIRECT 133 (H) 04/20/2019   TRIG 885 (HH) 04/20/2019   CHOLHDL 8.5 (H) 04/20/2019        ASSESSMENT / PLAN / RECOMMENDATIONS:   1) Type 2 Diabetes Mellitus, Poorly controlled, Without complications - Most recent A1c of 12.9 %. Goal A1c < 7.0 %.    Plan: GENERAL:   Poorly controlled diabetes due to suboptimal medical management, since her moved from [place] she has had some interruption in care. I have discussed with the patient the pathophysiology of diabetes. We went over the natural progression of the disease. We talked about  both insulin resistance and insulin deficiency. We stressed the importance of lifestyle changes including diet and exercise. I explained the complications associated with diabetes including retinopathy, nephropathy, neuropathy as well as increased risk of cardiovascular disease. We went over the benefit seen with glycemic control.    I explained to the patient that diabetic patients are at higher than normal risk for amputations.  Discussed pharmacokinetics of basal/bolus insulin and the importance of taking prandial insulin with meals.   We also discussed avoiding sugar-sweetened beverages and snacks, when possible.  I am unable to make changes to her regimen today, due to the fact that she is on an MDI regimen and no glucose data available today.  I explained to the patient the importance of having her meter on next visit as I am not sure if her NovoLog versus Lantus needs adjustment.  I suspect that she will eventually need more NovoLog and less Lantus but will wait on her glucose data.   MEDICATIONS: - Continue Lantus at 35 units twice daily - Continue Novolog at 5 units with each meal  -Continue Metformin 1000 mg twice daily -Continue Ozempic 0.5 mg weekly    EDUCATION / INSTRUCTIONS:  BG monitoring instructions: Patient is instructed to check her blood sugars 4 times a day, before meals and bedtime.  Call [facility] if: BG  persistently < 70 or > 300. . I reviewed the Rule of 15 for the treatment of hypoglycemia in detail with the patient. Literature supplied.   2) Diabetic complications:   Eye: Does not have known diabetic retinopathy.   Neuro/ Feet: Does not have known diabetic peripheral neuropathy.  Renal: Patient does not have known baseline CKD. She is not on an ACEI/ARB at present.Check urine albumin/creatinine ratio yearly starting at time of diagnosis.  3) Mixed Dyslipidemia: TG are 885 mg/dL, LDL also high at 133 mg/dL .  This should improve with good glycemic control.  She is already on Lipitor 80 mg, patient has not been compliant with this in the past, I have encouraged compliance.   Follow-up in 3 months      Signed electronically by: Mack Guise, MD  [facility] Endocrinology  [facility] [address] [place], [place] [address] Phone: [phone] FAX: [phone]   CC: Doreen Beam, [place] [address] Phone: [phone]  Fax: [phone]    Return to Endocrinology clinic as below: Future Appointments  Date Time Provider [place]  04/28/2019  4:00 PM Doreen Beam, FNP [facility]  07/26/2019  9:10 AM Saydi Kobel, Melanie Crazier, MD [facility] None

## 2019-04-26 DIAGNOSIS — E782 Mixed hyperlipidemia: Secondary | ICD-10-CM | POA: Insufficient documentation

## 2019-04-26 DIAGNOSIS — E1169 Type 2 diabetes mellitus with other specified complication: Secondary | ICD-10-CM | POA: Insufficient documentation

## 2019-04-28 ENCOUNTER — Encounter: Payer: Self-pay | Admitting: Adult Health

## 2019-04-28 ENCOUNTER — Ambulatory Visit (INDEPENDENT_AMBULATORY_CARE_PROVIDER_SITE_OTHER): Payer: Managed Care, Other (non HMO) | Admitting: Adult Health

## 2019-04-28 ENCOUNTER — Other Ambulatory Visit: Payer: Self-pay

## 2019-04-28 VITALS — BP 108/60 | HR 100 | Temp 97.1°F | Resp 16 | Wt 279.4 lb

## 2019-04-28 DIAGNOSIS — D582 Other hemoglobinopathies: Secondary | ICD-10-CM

## 2019-04-28 DIAGNOSIS — E1165 Type 2 diabetes mellitus with hyperglycemia: Secondary | ICD-10-CM | POA: Diagnosis not present

## 2019-04-28 DIAGNOSIS — E039 Hypothyroidism, unspecified: Secondary | ICD-10-CM

## 2019-04-28 DIAGNOSIS — E782 Mixed hyperlipidemia: Secondary | ICD-10-CM

## 2019-04-28 DIAGNOSIS — Z794 Long term (current) use of insulin: Secondary | ICD-10-CM

## 2019-04-28 DIAGNOSIS — G4700 Insomnia, unspecified: Secondary | ICD-10-CM | POA: Diagnosis not present

## 2019-04-28 MED ORDER — TRAZODONE HCL 50 MG PO TABS: 25.0000 mg | ORAL_TABLET | Freq: Every evening | ORAL | 0 refills | Status: DC | PRN

## 2019-04-28 NOTE — Patient Instructions (Signed)
Diabetes Mellitus and Nutrition, Adult When you have diabetes (diabetes mellitus), it is very important to have healthy eating habits because your blood sugar (glucose) levels are greatly affected by what you eat and drink. Eating healthy foods in the appropriate amounts, at about the same times every day, can help you:  Control your blood glucose.  Lower your risk of heart disease.  Improve your blood pressure.  Reach or maintain a healthy weight. Every person with diabetes is different, and each person has different needs for a meal plan. Your health care provider may recommend that you work with a diet and nutrition specialist (dietitian) to make a meal plan that is best for you. Your meal plan may vary depending on factors such as:  The calories you need.  The medicines you take.  Your weight.  Your blood glucose, blood pressure, and cholesterol levels.  Your activity level.  Other health conditions you have, such as heart or kidney disease. How do carbohydrates affect me? Carbohydrates, also called carbs, affect your blood glucose level more than any other type of food. Eating carbs naturally raises the amount of glucose in your blood. Carb counting is a method for keeping track of how many carbs you eat. Counting carbs is important to keep your blood glucose at a healthy level, especially if you use insulin or take certain oral diabetes medicines. It is important to know how many carbs you can safely have in each meal. This is different for every person. Your dietitian can help you calculate how many carbs you should have at each meal and for each snack. Foods that contain carbs include:  Bread, cereal, rice, pasta, and crackers.  Potatoes and corn.  Peas, beans, and lentils.  Milk and yogurt.  Fruit and juice.  Desserts, such as cakes, cookies, ice cream, and candy. How does alcohol affect me? Alcohol can cause a sudden decrease in blood glucose (hypoglycemia),  especially if you use insulin or take certain oral diabetes medicines. Hypoglycemia can be a life-threatening condition. Symptoms of hypoglycemia (sleepiness, dizziness, and confusion) are similar to symptoms of having too much alcohol. If your health care provider says that alcohol is safe for you, follow these guidelines:  Limit alcohol intake to no more than 1 drink per day for nonpregnant women and 2 drinks per day for men. One drink equals 12 oz of beer, 5 oz of wine, or 1 oz of hard liquor.  Do not drink on an empty stomach.  Keep yourself hydrated with water, diet soda, or unsweetened iced tea.  Keep in mind that regular soda, juice, and other mixers may contain a lot of sugar and must be counted as carbs. What are tips for following this plan?  Reading food labels  Start by checking the serving size on the "Nutrition Facts" label of packaged foods and drinks. The amount of calories, carbs, fats, and other nutrients listed on the label is based on one serving of the item. Many items contain more than one serving per package.  Check the total grams (g) of carbs in one serving. You can calculate the number of servings of carbs in one serving by dividing the total carbs by 15. For example, if a food has 30 g of total carbs, it would be equal to 2 servings of carbs.  Check the number of grams (g) of saturated and trans fats in one serving. Choose foods that have low or no amount of these fats.  Check the number of  milligrams (mg) of salt (sodium) in one serving. Most people should limit total sodium intake to less than 2,300 mg per day.  Always check the nutrition information of foods labeled as "low-fat" or "nonfat". These foods may be higher in added sugar or refined carbs and should be avoided.  Talk to your dietitian to identify your daily goals for nutrients listed on the label. Shopping  Avoid buying canned, premade, or processed foods. These foods tend to be high in fat, sodium,  and added sugar.  Shop around the outside edge of the grocery store. This includes fresh fruits and vegetables, bulk grains, fresh meats, and fresh dairy. Cooking  Use low-heat cooking methods, such as baking, instead of high-heat cooking methods like deep frying.  Cook using healthy oils, such as olive, canola, or sunflower oil.  Avoid cooking with butter, cream, or high-fat meats. Meal planning  Eat meals and snacks regularly, preferably at the same times every day. Avoid going long periods of time without eating.  Eat foods high in fiber, such as fresh fruits, vegetables, beans, and whole grains. Talk to your dietitian about how many servings of carbs you can eat at each meal.  Eat 4-6 ounces (oz) of lean protein each day, such as lean meat, chicken, fish, eggs, or tofu. One oz of lean protein is equal to: ? 1 oz of meat, chicken, or fish. ? 1 egg. ?  cup of tofu.  Eat some foods each day that contain healthy fats, such as avocado, nuts, seeds, and fish. Lifestyle  Check your blood glucose regularly.  Exercise regularly as told by your health care provider. This may include: ? 150 minutes of moderate-intensity or vigorous-intensity exercise each week. This could be brisk walking, biking, or water aerobics. ? Stretching and doing strength exercises, such as yoga or weightlifting, at least 2 times a week.  Take medicines as told by your health care provider.  Do not use any products that contain nicotine or tobacco, such as cigarettes and e-cigarettes. If you need help quitting, ask your health care provider.  Work with a Social worker or diabetes educator to identify strategies to manage stress and any emotional and social challenges. Questions to ask a health care provider  Do I need to meet with a diabetes educator?  Do I need to meet with a dietitian?  What number can I call if I have questions?  When are the best times to check my blood glucose? Where to find more  information:  American Diabetes Association: diabetes.org  Academy of Nutrition and Dietetics: www.eatright.[employer] of Diabetes and Digestive and Kidney Diseases (NIH): [employer] Summary  A healthy meal plan will help you control your blood glucose and maintain a healthy lifestyle.  Working with a diet and nutrition specialist (dietitian) can help you make a meal plan that is best for you.  Keep in mind that carbohydrates (carbs) and alcohol have immediate effects on your blood glucose levels. It is important to count carbs and to use alcohol carefully. This information is not intended to replace advice given to you by your health care provider. Make sure you discuss any questions you have with your health care provider. Document Revised: 02/12/2017 Document Reviewed: 04/06/2016 Elsevier Patient Education  [place]. Trazodone tablets What is this medicine? TRAZODONE (TRAZ oh done) is used to treat depression. This medicine may be used for other purposes; ask your health care provider or pharmacist if you have questions. COMMON BRAND NAME(S): Desyrel What  should I tell my health care provider before I take this medicine? They need to know if you have any of these conditions:  attempted suicide or thinking about it  bipolar disorder  bleeding problems  glaucoma  heart disease, or previous heart attack  irregular heart beat  kidney or liver disease  low levels of sodium in the blood  an unusual or allergic reaction to trazodone, other medicines, foods, dyes or preservatives  pregnant or trying to get pregnant  breast-feeding How should I use this medicine? Take this medicine by mouth with a glass of water. Follow the directions on the prescription label. Take this medicine shortly after a meal or a light snack. Take your medicine at regular intervals. Do not take your medicine more often than directed. Do not stop taking this medicine  suddenly except upon the advice of your doctor. Stopping this medicine too quickly may cause serious side effects or your condition may worsen. A special MedGuide will be given to you by the pharmacist with each prescription and refill. Be sure to read this information carefully each time. Talk to your pediatrician regarding the use of this medicine in children. Special care may be needed. Overdosage: If you think you have taken too much of this medicine contact a poison control center or emergency room at once. NOTE: This medicine is only for you. Do not share this medicine with others. What if I miss a dose? If you miss a dose, take it as soon as you can. If it is almost time for your next dose, take only that dose. Do not take double or extra doses. What may interact with this medicine? Do not take this medicine with any of the following medications:  certain medicines for fungal infections like fluconazole, itraconazole, ketoconazole, posaconazole, voriconazole  cisapride  dronedarone  linezolid  MAOIs like Carbex, Eldepryl, Marplan, Nardil, and Parnate  mesoridazine  methylene blue (injected into a vein)  pimozide  saquinavir  thioridazine This medicine may also interact with the following medications:  alcohol  antiviral medicines for HIV or AIDS  aspirin and aspirin-like medicines  barbiturates like phenobarbital  certain medicines for blood pressure, heart disease, irregular heart beat  certain medicines for depression, anxiety, or psychotic disturbances  certain medicines for migraine headache like almotriptan, eletriptan, frovatriptan, naratriptan, rizatriptan, sumatriptan, zolmitriptan  certain medicines for seizures like carbamazepine and phenytoin  certain medicines for sleep  certain medicines that treat or prevent blood clots like dalteparin, enoxaparin, warfarin  digoxin  fentanyl  lithium  NSAIDS, medicines for pain and inflammation, like  ibuprofen or naproxen  other medicines that prolong the QT interval (cause an abnormal heart rhythm) like dofetilide  rasagiline  supplements like St. John's wort, kava kava, valerian  tramadol  tryptophan This list may not describe all possible interactions. Give your health care provider a list of all the medicines, herbs, non-prescription drugs, or dietary supplements you use. Also tell them if you smoke, drink alcohol, or use illegal drugs. Some items may interact with your medicine. What should I watch for while using this medicine? Tell your doctor if your symptoms do not get better or if they get worse. Visit your doctor or health care professional for regular checks on your progress. Because it may take several weeks to see the full effects of this medicine, it is important to continue your treatment as prescribed by your doctor. Patients and their families should watch out for new or worsening thoughts of suicide or depression.  Also watch out for sudden changes in feelings such as feeling anxious, agitated, panicky, irritable, hostile, aggressive, impulsive, severely restless, overly excited and hyperactive, or not being able to sleep. If this happens, especially at the beginning of treatment or after a change in dose, call your health care professional. Dennis Bast may get drowsy or dizzy. Do not drive, use machinery, or do anything that needs mental alertness until you know how this medicine affects you. Do not stand or sit up quickly, especially if you are an older patient. This reduces the risk of dizzy or fainting spells. Alcohol may interfere with the effect of this medicine. Avoid alcoholic drinks. This medicine may cause dry eyes and blurred vision. If you wear contact lenses you may feel some discomfort. Lubricating drops may help. See your eye doctor if the problem does not go away or is severe. Your mouth may get dry. Chewing sugarless gum, sucking hard candy and drinking plenty of water  may help. Contact your doctor if the problem does not go away or is severe. What side effects may I notice from receiving this medicine? Side effects that you should report to your doctor or health care professional as soon as possible:  allergic reactions like skin rash, itching or hives, swelling of the face, lips, or tongue  elevated mood, decreased need for sleep, racing thoughts, impulsive behavior  confusion  fast, irregular heartbeat  feeling faint or lightheaded, falls  feeling agitated, angry, or irritable  loss of balance or coordination  painful or prolonged erections  restlessness, pacing, inability to keep still  suicidal thoughts or other mood changes  tremors  trouble sleeping  seizures  unusual bleeding or bruising Side effects that usually do not require medical attention (report to your doctor or health care professional if they continue or are bothersome):  change in sex drive or performance  change in appetite or weight  constipation  headache  muscle aches or pains  nausea This list may not describe all possible side effects. Call your doctor for medical advice about side effects. You may report side effects to FDA at 1-800-FDA-1088. Where should I keep my medicine? Keep out of the reach of children. Store at room temperature between 15 and 30 degrees C (59 to 86 degrees F). Protect from light. Keep container tightly closed. Throw away any unused medicine after the expiration date. NOTE: This sheet is a summary. It may not cover all possible information. If you have questions about this medicine, talk to your doctor, pharmacist, or health care provider.  2020 Elsevier/Gold Standard (2018-02-22 11:46:46) Insomnia Insomnia is a sleep disorder that makes it difficult to fall asleep or stay asleep. Insomnia can cause fatigue, low energy, difficulty concentrating, mood swings, and poor performance at work or school. There are three different ways  to classify insomnia:  Difficulty falling asleep.  Difficulty staying asleep.  Waking up too early in the morning. Any type of insomnia can be long-term (chronic) or short-term (acute). Both are common. Short-term insomnia usually lasts for three months or less. Chronic insomnia occurs at least three times a week for longer than three months. What are the causes? Insomnia may be caused by another condition, situation, or substance, such as:  Anxiety.  Certain medicines.  Gastroesophageal reflux disease (GERD) or other gastrointestinal conditions.  Asthma or other breathing conditions.  Restless legs syndrome, sleep apnea, or other sleep disorders.  Chronic pain.  Menopause.  Stroke.  Abuse of alcohol, tobacco, or illegal drugs.  Mental  health conditions, such as depression.  Caffeine.  Neurological disorders, such as Alzheimer's disease.  An overactive thyroid (hyperthyroidism). Sometimes, the cause of insomnia may not be known. What increases the risk? Risk factors for insomnia include:  Gender. Women are affected more often than men.  Age. Insomnia is more common as you get older.  Stress.  Lack of exercise.  Irregular work schedule or working night shifts.  Traveling between different time zones.  Certain medical and mental health conditions. What are the signs or symptoms? If you have insomnia, the main symptom is having trouble falling asleep or having trouble staying asleep. This may lead to other symptoms, such as:  Feeling fatigued or having low energy.  Feeling nervous about going to sleep.  Not feeling rested in the morning.  Having trouble concentrating.  Feeling irritable, anxious, or depressed. How is this diagnosed? This condition may be diagnosed based on:  Your symptoms and medical history. Your health care provider may ask about: ? Your sleep habits. ? Any medical conditions you have. ? Your mental health.  A physical  exam. How is this treated? Treatment for insomnia depends on the cause. Treatment may focus on treating an underlying condition that is causing insomnia. Treatment may also include:  Medicines to help you sleep.  Counseling or therapy.  Lifestyle adjustments to help you sleep better. Follow these instructions at home: Eating and drinking   Limit or avoid alcohol, caffeinated beverages, and cigarettes, especially close to bedtime. These can disrupt your sleep.  Do not eat a large meal or eat spicy foods right before bedtime. This can lead to digestive discomfort that can make it hard for you to sleep. Sleep habits   Keep a sleep diary to help you and your health care provider figure out what could be causing your insomnia. Write down: ? When you sleep. ? When you wake up during the night. ? How well you sleep. ? How rested you feel the next day. ? Any side effects of medicines you are taking. ? What you eat and drink.  Make your bedroom a dark, comfortable place where it is easy to fall asleep. ? Put up shades or blackout curtains to block light from outside. ? Use a white noise machine to block noise. ? Keep the temperature cool.  Limit screen use before bedtime. This includes: ? Watching TV. ? Using your smartphone, tablet, or computer.  Stick to a routine that includes going to bed and waking up at the same times every day and night. This can help you fall asleep faster. Consider making a quiet activity, such as reading, part of your nighttime routine.  Try to avoid taking naps during the day so that you sleep better at night.  Get out of bed if you are still awake after 15 minutes of trying to sleep. Keep the lights down, but try reading or doing a quiet activity. When you feel sleepy, go back to bed. General instructions  Take over-the-counter and prescription medicines only as told by your health care provider.  Exercise regularly, as told by your health care  provider. Avoid exercise starting several hours before bedtime.  Use relaxation techniques to manage stress. Ask your health care provider to suggest some techniques that may work well for you. These may include: ? Breathing exercises. ? Routines to release muscle tension. ? Visualizing peaceful scenes.  Make sure that you drive carefully. Avoid driving if you feel very sleepy.  Keep all follow-up visits as  told by your health care provider. This is important. Contact a health care provider if:  You are tired throughout the day.  You have trouble in your daily routine due to sleepiness.  You continue to have sleep problems, or your sleep problems get worse. Get help right away if:  You have serious thoughts about hurting yourself or someone else. If you ever feel like you may hurt yourself or others, or have thoughts about taking your own life, get help right away. You can go to your nearest emergency department or call:  Your local emergency services (911 in the [place]  A suicide crisis helpline, such as the [place] at 680-512-4255. This is open 24 hours a day. Summary  Insomnia is a sleep disorder that makes it difficult to fall asleep or stay asleep.  Insomnia can be long-term (chronic) or short-term (acute).  Treatment for insomnia depends on the cause. Treatment may focus on treating an underlying condition that is causing insomnia.  Keep a sleep diary to help you and your health care provider figure out what could be causing your insomnia. This information is not intended to replace advice given to you by your health care provider. Make sure you discuss any questions you have with your health care provider. Document Revised: 02/12/2017 Document Reviewed: 12/10/2016 Elsevier Patient Education  2020 [employer].

## 2019-04-28 NOTE — Progress Notes (Signed)
Patient: Valerie Schmitt Female    DOB: July 08, 1965   54 y.o.   MRN: [id number] Visit Date: 04/28/2019  Today's Provider: Marcille Buffy, FNP   Chief Complaint  Patient presents with  . Diabetes    1 week follow up   Subjective:     HPI  Diabetes Mellitus Type II, Follow-up:   Lab Results  Component Value Date   HGBA1C 12.9 (H) 04/20/2019   HGBA1C 13.1 (A) 04/20/2019    Last seen for diabetes 1 weeks ago.  Management since then includes increasing Lants to 35units BID. She reports excellent compliance with treatment. She is not having side effects.  Current symptoms include visual disturbances and have been worsening.She reports this is due to a wrong glasses prescription and she will follow up with her eye doctor. She reports this is the second time her glasses have been wrong. Denies any headache, floaters or eye pain. Denies any signs of infection  Home blood sugar records: fasting range: 135-300  Episodes of hypoglycemia? no   Current insulin regiment: 35 units Lantus  Most Recent Eye Exam: 01/2019 patient reports Weight trend: stable Prior visit with dietician: No Current exercise: none Current diet habits: not asked  Pertinent Labs:    Component Value Date/Time   CHOL 305 (H) 04/20/2019 1058   TRIG 885 (HH) 04/20/2019 1058   HDL 36 (L) 04/20/2019 1058   LDLCALC Comment (A) 04/20/2019 1058   CREATININE 0.71 04/20/2019 1058    Wt Readings from Last 3 Encounters:  04/25/19 279 lb 12.8 oz (126.9 kg)  04/20/19 278 lb 9.6 oz (126.4 kg)   Patient also has concerns or not sleeping well at night, she is on Cymbalta currently at the day she has been on for the last few years, she has had increased dressers in her life in the last few years.  She would like to try trazodone to help her sleep, is understood that she should also see psychiatry for counseling and additional medication if needed.  Trazodone will be temporarily for sleep.  Patient   denies any fever, body aches,chills, rash, chest pain, shortness of breath, nausea, vomiting, or diarrhea.  She declined Fish oil for triglycerides elevated.  ------------------------------------------------------------------------  No Known Allergies   Current Outpatient Medications:  .  atorvastatin (LIPITOR) 80 MG tablet, Take 80 mg by mouth every evening., Disp: , Rfl:  .  Blood Glucose Monitoring Suppl (ONETOUCH ULTRALINK) w/Device KIT, by Does not apply route 3 (three) times daily., Disp: , Rfl:  .  DOXYCYCLINE PO, Take by mouth., Disp: , Rfl:  .  DULoxetine (CYMBALTA) 60 MG capsule, Take 1 capsule (60 mg total) by mouth 2 (two) times daily., Disp: 120 capsule, Rfl: 0 .  Insulin Aspart (NOVOLOG [place]), Inject 5 Units into the skin. Before meals, Disp: , Rfl:  .  insulin glargine (LANTUS) 100 UNIT/ML injection, Inject 0.35 mLs (35 Units total) into the skin 2 (two) times daily., Disp: 10 mL, Rfl: 0 .  Insulin Syringes, Disposable, U-100 1 ML MISC, 1 Bag by Does not apply route 3 (three) times daily between meals as needed., Disp: 100 each, Rfl: 0 .  ketoconazole (NIZORAL) 2 % cream, Apply 1 application topically daily. To area on left foot., Disp: 30 g, Rfl: 0 .  levothyroxine (SYNTHROID) 88 MCG tablet, Take 88 mcg by mouth daily., Disp: , Rfl:  .  meloxicam (MOBIC) 15 MG tablet, Take 15 mg by mouth daily., Disp: , Rfl:  .  metFORMIN (GLUCOPHAGE) 1000 MG tablet, Take 1 tablet (1,000 mg total) by mouth 2 (two) times daily with a meal., Disp: 120 tablet, Rfl: 0 .  Semaglutide,0.25 or 0.5MG/DOS, (OZEMPIC, 0.25 OR 0.5 MG/DOSE,) 2 MG/1.5ML SOPN, Inject 0.25 mg into the skin once a week., Disp: 1 pen, Rfl: 0  Review of Systems  Constitutional: Positive for fatigue. Negative for activity change, appetite change, chills, diaphoresis, fever and unexpected weight change.  Respiratory: Negative.   Cardiovascular: Negative.   Gastrointestinal: Negative.   Endocrine: Positive for polyphagia and  polyuria. Negative for cold intolerance, heat intolerance and polydipsia.       She did follow-up with endocrinology, her blood sugars are running much improved since she started back her medication regimen.  She had been occasionally taking her medicines before her visit with me.  No other medication changes were made with endocrinology.  Patient reports she has had blood sugars as noted in HPI.  Genitourinary: Negative.   Musculoskeletal: Negative.   Skin: Negative.   Neurological: Negative.   Psychiatric/Behavioral: Positive for sleep disturbance. Negative for agitation, behavioral problems, confusion, decreased concentration, dysphoric mood, hallucinations, self-injury and suicidal ideas. The patient is not nervous/anxious and is not hyperactive.     Social History   Tobacco Use  . Smoking status: Never Smoker  . Smokeless tobacco: Never Used  Substance Use Topics  . Alcohol use: Not Currently      Objective:  Blood pressure 108/60, pulse 100, temperature (!) 97.1 F (36.2 C), temperature source Oral, resp. rate 16, weight 279 lb 6.4 oz (126.7 kg), SpO2 99 %.   Physical Exam Vitals reviewed.  Constitutional:      General: She is not in acute distress.    Appearance: She is obese. She is not ill-appearing, toxic-appearing or diaphoretic.  HENT:     Head: Normocephalic and atraumatic.     Right Ear: Tympanic membrane normal.     Left Ear: Tympanic membrane normal.     Nose: No congestion or rhinorrhea.     Mouth/Throat:     Mouth: Mucous membranes are moist.     Pharynx: No oropharyngeal exudate or posterior oropharyngeal erythema.  Eyes:     Extraocular Movements: Extraocular movements intact.     Conjunctiva/sclera: Conjunctivae normal.     Pupils: Pupils are equal, round, and reactive to light.  Cardiovascular:     Rate and Rhythm: Normal rate and regular rhythm.     Pulses: Normal pulses.     Heart sounds: Normal heart sounds. No murmur. No friction rub. No gallop.     Pulmonary:     Effort: Pulmonary effort is normal. No respiratory distress.     Breath sounds: Normal breath sounds. No stridor. No wheezing, rhonchi or rales.  Chest:     Chest wall: No tenderness.  Abdominal:     Palpations: Abdomen is soft.  Musculoskeletal:        General: Normal range of motion.  Skin:    General: Skin is warm and dry.     Capillary Refill: Capillary refill takes less than 2 seconds.  Neurological:     Mental Status: She is oriented to person, place, and time.  Psychiatric:        Mood and Affect: Mood normal.        Behavior: Behavior normal.        Thought Content: Thought content normal.        Judgment: Judgment normal.      No results found for  any visits on 04/28/19.     Assessment & Plan     1. Insomnia, unspecified type Sleep hygiene discussed.   Meds ordered this encounter  Medications  . traZODone (DESYREL) 50 MG tablet    Sig: Take 0.5 tablets (25 mg total) by mouth at bedtime as needed for sleep.    Dispense:  30 tablet    Refill:  0  plan to wean off. Discussed possible side effects with Cymbalta and when to seek care. Patient verbalized understanding of all instructions given and denies any further questions at this time.    2. Hypothyroidism, unspecified type Levothyroxine 88 mcg by mouth daily. Recheck as directed - TSH.   3. Type 2 diabetes mellitus with hyperglycemia, with long-term current use of insulin (HCC) Continue current plan, keep log of blood glucose and report any hypoglycemia or hyper glycemia eventa.  Nutrition education given.  - CBC with Differential/Platelet - Comprehensive Metabolic Panel (CMET) Orders Placed This Encounter  Procedures  . CBC with Differential/Platelet  . Comprehensive Metabolic Panel (CMET)  . Lipid Panel w/o Chol/HDL Ratio   4. Elevated triglycerides with high cholesterol Recheck in one month. Diet and exercise. Lifestyle modifications.  - CBC with Differential/Platelet -  Comprehensive Metabolic Panel (CMET) - Lipid Panel w/o Chol/HDL Ratio  She has been referred to diabetes and nutrition for hyperglycemia , DM II and elevated triglycerides and hyperlipidemia.   5. Elevated hemoglobin (HCC) Hemoglobin was 17, hematocrit was 48.6. Denies iron supplement, B 12 or ever being told hemoglobin was elevated. Will recheck in one month from first check.        The entirety of the information documented in the History of Present Illness, Review of Systems and Physical Exam were personally obtained by me. Portions of this information were initially documented by the  Certified Medical Assistant whose name is documented in [place] and reviewed by me for thoroughness and accuracy.  I have personally performed the exam and reviewed the chart and it is accurate to the best of my knowledge.  Haematologist has been used and any errors in dictation or transcription are unintentional.  The entirety of the information documented in the History of Present Illness, Review of Systems and Physical Exam were personally obtained by me. Portions of this information were initially documented by the  Certified Medical Assistant whose name is documented in [place] and reviewed by me for thoroughness and accuracy.  I have personally performed the exam and reviewed the chart and it is accurate to the best of my knowledge.  Haematologist has been used and any errors in dictation or transcription are unintentional.  Kelby Aline. [place]. Wink, [facility]

## 2019-05-01 DIAGNOSIS — D582 Other hemoglobinopathies: Secondary | ICD-10-CM | POA: Insufficient documentation

## 2019-05-24 ENCOUNTER — Other Ambulatory Visit: Payer: Self-pay | Admitting: Adult Health

## 2019-05-24 NOTE — Telephone Encounter (Signed)
Requested medication (s) are due for refill today -yes  Requested medication (s) are on the active medication list -yes  Future visit scheduled -yes  Last refill: -04/20/19  Notes to clinic: Patient is requesting a historical med. Sent for review   Requested Prescriptions  Pending Prescriptions Disp Refills   BD INSULIN SYRINGE U/F 31G X 5/16" 1 ML MISC [Pharmacy Med Name: BD INSULIN SYR UF 1 ML [id number]]      Sig: 1 BAG BY DOES NOT APPLY ROUTE 3 (THREE) TIMES DAILY BETWEEN MEALS AS NEEDED.      There is no refill protocol information for this order        Requested Prescriptions  Pending Prescriptions Disp Refills   BD INSULIN SYRINGE U/F 31G X 5/16" 1 ML MISC [Pharmacy Med Name: BD INSULIN SYR UF 1 ML [id number]]      Sig: 1 BAG BY DOES NOT APPLY ROUTE 3 (THREE) TIMES DAILY BETWEEN MEALS AS NEEDED.      There is no refill protocol information for this order

## 2019-05-29 ENCOUNTER — Other Ambulatory Visit: Payer: Self-pay | Admitting: Adult Health

## 2019-05-29 NOTE — Telephone Encounter (Signed)
Requested Prescriptions  Pending Prescriptions Disp Refills  . Insulin Glargine (BASAGLAR KWIKPEN) 100 UNIT/ML [Pharmacy Med Name: BASAGLAR 100 UNIT/ML KWIKPEN] 30 mL 1    Sig: INJECT 0.35 MLS (35 UNITS TOTAL) INTO THE SKIN 2 (TWO) TIMES DAILY.     Endocrinology:  Diabetes - Insulins Failed - 05/29/2019  7:51 PM      Failed - HBA1C is between 0 and 7.9 and within 180 days    Hemoglobin A1C  Date Value Ref Range Status  04/20/2019 13.1 (A) 4.0 - 5.6 % Final   Hgb A1c MFr Bld  Date Value Ref Range Status  04/20/2019 12.9 (H) 4.8 - 5.6 % Final    Comment:             Prediabetes: 5.7 - 6.4          Diabetes: >6.4          Glycemic control for adults with diabetes: <7.0          Passed - Valid encounter within last 6 months    Recent Outpatient Visits          1 month ago Type 2 diabetes mellitus with hyperglycemia, with long-term current use of insulin ([place])   [facility] Flinchum, Kelby Aline, FNP   1 month ago Type 2 diabetes mellitus with hyperglycemia, with long-term current use of insulin ([place])   [facility] Flinchum, Kelby Aline, FNP      Future Appointments            In 1 month Flinchum, Kelby Aline, FNP [facility], [facility]           Patient has an appointment with provider in 1 month.

## 2019-06-07 ENCOUNTER — Encounter: Payer: Self-pay | Admitting: Skilled Nursing Facility1

## 2019-06-07 ENCOUNTER — Encounter: Payer: Managed Care, Other (non HMO) | Attending: Adult Health | Admitting: Skilled Nursing Facility1

## 2019-06-07 ENCOUNTER — Other Ambulatory Visit: Payer: Self-pay

## 2019-06-07 DIAGNOSIS — R81 Glycosuria: Secondary | ICD-10-CM | POA: Insufficient documentation

## 2019-06-07 DIAGNOSIS — Z6837 Body mass index (BMI) 37.0-37.9, adult: Secondary | ICD-10-CM | POA: Insufficient documentation

## 2019-06-07 DIAGNOSIS — F339 Major depressive disorder, recurrent, unspecified: Secondary | ICD-10-CM | POA: Diagnosis not present

## 2019-06-07 DIAGNOSIS — Z713 Dietary counseling and surveillance: Secondary | ICD-10-CM | POA: Insufficient documentation

## 2019-06-07 DIAGNOSIS — Z794 Long term (current) use of insulin: Secondary | ICD-10-CM | POA: Diagnosis not present

## 2019-06-07 DIAGNOSIS — E119 Type 2 diabetes mellitus without complications: Secondary | ICD-10-CM

## 2019-06-07 DIAGNOSIS — E1165 Type 2 diabetes mellitus with hyperglycemia: Secondary | ICD-10-CM | POA: Diagnosis not present

## 2019-06-07 NOTE — Progress Notes (Signed)
Pt states she has had DM since about 2016 or 2017. Pt states she has had a lot of life events in the past year so has been throwing herself into her work to cope.  Pt states she does not wear her C-PAP. Pt states she cannot afford to get a sleep study. Pt states she has not had a hysterectomy she states she still has a uterus. Pt states she does not do well with nutrition and does not plan her meals stating she needs a specific plan.   Pt states she emotionally eats at night. Pt states she checks her blood sugar 1-2 times a day: fasting: 180    And before lunch: unknown. Pt was unable to recall her blood sugar numbers state=ing her number stay between 180-220.  Pt states she bought an insulin travel kit to take her insulin when she is out and eats. Pt understands the pathophysiology of diabetes.  Pt states her daughter is a vegetarian.  Pt states she used to wake up in the middle of the night and eat but has stopped that behavior. Pt states she has some stress wtih the iidea it will be only her one day or her daughters mental health. Pt state she believes she eats because she is lonely. Pt states she is lazy and does not want to cook.   Education Topics: Hyperglycemia Take your glucometer with you everywhere you go Balanced meals and the purpose of structure/consistancy   Handouts: Diabetes book Diabetes cook books Needs Feelings Should I eat Hobby List  Goals: Set up an alarm and stand every 30 minutes while working Create balanced meals eating every 5 hours starting within 1-1.5 hours after waking  Work on emotional eating using needs and feeling sheet, am I hungry flow sheet Find a hobby Limit eating out to once a week

## 2019-06-21 ENCOUNTER — Ambulatory Visit: Payer: Managed Care, Other (non HMO) | Admitting: Skilled Nursing Facility1

## 2019-06-27 ENCOUNTER — Other Ambulatory Visit: Payer: Self-pay | Admitting: Adult Health

## 2019-06-27 MED ORDER — DULOXETINE HCL 60 MG PO CPEP: 60.0000 mg | ORAL_CAPSULE | Freq: Two times a day (BID) | ORAL | 0 refills | Status: DC

## 2019-06-27 MED ORDER — TRAZODONE HCL 50 MG PO TABS: 25.0000 mg | ORAL_TABLET | Freq: Every evening | ORAL | 0 refills | Status: DC | PRN

## 2019-06-27 NOTE — Telephone Encounter (Signed)
Medication Refill - Medication: DULoxetine (CYMBALTA) 60 MG capsule [  traZODone (DESYREL) 50 MG tablet   Has the patient contacted their pharmacy? Yes.   (Agent: If no, request that the patient contact the pharmacy for the refill.) (Agent: If yes, when and what did the pharmacy advise?)  Preferred Pharmacy (with phone number or street name):  [id number] - [place], [address]  [address] [place] [address]  Phone: [phone] Fax: [phone]     Agent: Please be advised that RX refills may take up to 3 business days. We ask that you follow-up with your pharmacy.

## 2019-06-27 NOTE — Telephone Encounter (Signed)
Sent refills. Remind patient she has lab orders in place and is overdue for recheck on her hemoglobin and triglycerides. All of my orders are in and lab walk in.

## 2019-06-27 NOTE — Telephone Encounter (Signed)
Patient has been advised and states that she will make time to get labs drawn soon. KW

## 2019-07-19 ENCOUNTER — Other Ambulatory Visit: Payer: Self-pay | Admitting: Adult Health

## 2019-07-20 LAB — CBC WITH DIFFERENTIAL/PLATELET
Basophils Absolute: 0.1 10*3/uL (ref 0.0–0.2)
Basos: 1 %
EOS (ABSOLUTE): 0.3 10*3/uL (ref 0.0–0.4)
Eos: 4 %
Hematocrit: 47.2 % — ABNORMAL HIGH (ref 34.0–46.6)
Hemoglobin: 15.3 g/dL (ref 11.1–15.9)
Immature Grans (Abs): 0 10*3/uL (ref 0.0–0.1)
Immature Granulocytes: 0 %
Lymphocytes Absolute: 1.7 10*3/uL (ref 0.7–3.1)
Lymphs: 25 %
MCH: 29.4 pg (ref 26.6–33.0)
MCHC: 32.4 g/dL (ref 31.5–35.7)
MCV: 91 fL (ref 79–97)
Monocytes Absolute: 0.5 10*3/uL (ref 0.1–0.9)
Monocytes: 7 %
Neutrophils Absolute: 4.3 10*3/uL (ref 1.4–7.0)
Neutrophils: 63 %
Platelets: 245 10*3/uL (ref 150–450)
RBC: 5.2 x10E6/uL (ref 3.77–5.28)
RDW: 12.4 % (ref 11.7–15.4)
WBC: 6.9 10*3/uL (ref 3.4–10.8)

## 2019-07-20 LAB — COMPREHENSIVE METABOLIC PANEL WITH GFR
ALT: 17 IU/L (ref 0–32)
AST: 14 IU/L (ref 0–40)
Albumin/Globulin Ratio: 2 (ref 1.2–2.2)
Albumin: 4.4 g/dL (ref 3.8–4.9)
Alkaline Phosphatase: 80 IU/L (ref 39–117)
BUN/Creatinine Ratio: 18 (ref 9–23)
BUN: 12 mg/dL (ref 6–24)
Bilirubin Total: 0.7 mg/dL (ref 0.0–1.2)
CO2: 22 mmol/L (ref 20–29)
Calcium: 9.5 mg/dL (ref 8.7–10.2)
Chloride: 97 mmol/L (ref 96–106)
Creatinine, Ser: 0.67 mg/dL (ref 0.57–1.00)
GFR calc Af Amer: 117 mL/min/1.73
GFR calc non Af Amer: 101 mL/min/1.73
Globulin, Total: 2.2 g/dL (ref 1.5–4.5)
Glucose: 243 mg/dL — ABNORMAL HIGH (ref 65–99)
Potassium: 4.2 mmol/L (ref 3.5–5.2)
Sodium: 138 mmol/L (ref 134–144)
Total Protein: 6.6 g/dL (ref 6.0–8.5)

## 2019-07-20 LAB — IRON AND TIBC
Iron Saturation: 35 % (ref 15–55)
Iron: 118 ug/dL (ref 27–159)
Total Iron Binding Capacity: 340 ug/dL (ref 250–450)
UIBC: 222 ug/dL (ref 131–425)

## 2019-07-20 LAB — LIPID PANEL W/O CHOL/HDL RATIO
Cholesterol, Total: 194 mg/dL (ref 100–199)
HDL: 39 mg/dL — ABNORMAL LOW (ref >39)
LDL Chol Calc (NIH): 122 mg/dL — ABNORMAL HIGH (ref 0–99)
Triglycerides: 184 mg/dL — ABNORMAL HIGH (ref 0–149)
VLDL Cholesterol Cal: 33 mg/dL (ref 5–40)

## 2019-07-21 NOTE — Progress Notes (Signed)
Sent to [employer].  Hemoglobin improved to normal range. Hematocrit still mild elevation but improved.  Glucose still elevated 243 keep follow up with endocrinologist.  Triglycerides still elevated but much improved from last, continue diet recommendations and glucose control. Diet and exercise.  LDL bad cholesterol elevated. Total cholesterol now normal.  Iron panel is within normal limits.

## 2019-07-26 ENCOUNTER — Other Ambulatory Visit: Payer: Self-pay

## 2019-07-26 ENCOUNTER — Ambulatory Visit: Payer: Managed Care, Other (non HMO) | Admitting: Internal Medicine

## 2019-07-26 ENCOUNTER — Encounter: Payer: Self-pay | Admitting: Internal Medicine

## 2019-07-26 ENCOUNTER — Encounter: Payer: Self-pay | Admitting: Adult Health

## 2019-07-26 ENCOUNTER — Ambulatory Visit: Payer: Managed Care, Other (non HMO) | Admitting: Adult Health

## 2019-07-26 VITALS — BP 124/80 | HR 88 | Temp 97.3°F | Resp 16 | Wt 282.8 lb

## 2019-07-26 VITALS — BP 104/72 | HR 98 | Temp 97.7°F | Ht 72.0 in | Wt 280.6 lb

## 2019-07-26 DIAGNOSIS — F339 Major depressive disorder, recurrent, unspecified: Secondary | ICD-10-CM

## 2019-07-26 DIAGNOSIS — E1165 Type 2 diabetes mellitus with hyperglycemia: Secondary | ICD-10-CM | POA: Diagnosis not present

## 2019-07-26 DIAGNOSIS — Z6837 Body mass index (BMI) 37.0-37.9, adult: Secondary | ICD-10-CM

## 2019-07-26 DIAGNOSIS — E785 Hyperlipidemia, unspecified: Secondary | ICD-10-CM

## 2019-07-26 DIAGNOSIS — E1169 Type 2 diabetes mellitus with other specified complication: Secondary | ICD-10-CM | POA: Diagnosis not present

## 2019-07-26 DIAGNOSIS — E559 Vitamin D deficiency, unspecified: Secondary | ICD-10-CM

## 2019-07-26 DIAGNOSIS — Z1231 Encounter for screening mammogram for malignant neoplasm of breast: Secondary | ICD-10-CM

## 2019-07-26 DIAGNOSIS — Z6838 Body mass index (BMI) 38.0-38.9, adult: Secondary | ICD-10-CM

## 2019-07-26 DIAGNOSIS — Z794 Long term (current) use of insulin: Secondary | ICD-10-CM | POA: Diagnosis not present

## 2019-07-26 DIAGNOSIS — Z6836 Body mass index (BMI) 36.0-36.9, adult: Secondary | ICD-10-CM | POA: Insufficient documentation

## 2019-07-26 LAB — POCT GLYCOSYLATED HEMOGLOBIN (HGB A1C): Hemoglobin A1C: 10.4 % — AB (ref 4.0–5.6)

## 2019-07-26 MED ORDER — METFORMIN HCL 1000 MG PO TABS: 1000.0000 mg | ORAL_TABLET | Freq: Two times a day (BID) | ORAL | 3 refills | Status: DC

## 2019-07-26 MED ORDER — INSULIN PEN NEEDLE 31G X 5 MM MISC: 1.0000 | Freq: Four times a day (QID) | 3 refills | Status: DC

## 2019-07-26 MED ORDER — TRAZODONE HCL 50 MG PO TABS: 50.0000 mg | ORAL_TABLET | Freq: Every evening | ORAL | 0 refills | Status: DC | PRN

## 2019-07-26 MED ORDER — INSULIN LISPRO (1 UNIT DIAL) 100 UNIT/ML (KWIKPEN): 10.0000 [IU] | PEN_INJECTOR | Freq: Three times a day (TID) | SUBCUTANEOUS | 6 refills | Status: DC

## 2019-07-26 MED ORDER — OZEMPIC (1 MG/DOSE) 4 MG/3ML ~~LOC~~ SOPN: 1.0000 mg | PEN_INJECTOR | SUBCUTANEOUS | 3 refills | Status: DC

## 2019-07-26 MED ORDER — BASAGLAR KWIKPEN 100 UNIT/ML ~~LOC~~ SOPN: 50.0000 [IU] | PEN_INJECTOR | Freq: Every day | SUBCUTANEOUS | 6 refills | Status: DC

## 2019-07-26 MED ORDER — LEVOTHYROXINE SODIUM 88 MCG PO TABS: 88.0000 ug | ORAL_TABLET | Freq: Every day | ORAL | 1 refills | Status: DC

## 2019-07-26 NOTE — Progress Notes (Signed)
Established patient visit   Patient: Valerie Schmitt   DOB: 17-Jan-1966   54 y.o. Female  MRN: [id number] Visit Date: 07/26/2019  Today's healthcare provider: Marcille Buffy, FNP   Chief Complaint  Patient presents with  . Diabetes   Subjective    HPI  Diabetes Mellitus Type II, Follow-up  Lab Results  Component Value Date   HGBA1C 10.4 (A) 07/26/2019   HGBA1C 12.9 (H) 04/20/2019   HGBA1C 13.1 (A) 04/20/2019   Wt Readings from Last 3 Encounters:  07/26/19 282 lb 12.8 oz (128.3 kg)  07/26/19 280 lb 9.6 oz (127.3 kg)  04/28/19 279 lb 6.4 oz (126.7 kg)   Last seen for diabetes 2 months ago.  Management since then includes none, labs were ordered. She reports excellent compliance with treatment. She is not having side effects.  Symptoms: No fatigue No foot ulcerations  No appetite changes No nausea  No paresthesia (numbness or tingling) of the feet  No polydipsia (excessive thirst)  Yes polyuria (frequent urination) No visual disturbances   No vomiting     Home blood sugar records: patient reports in the 200s  Episodes of hypoglycemia? No     Current insulin regiment: patient reports that she takes 10units of Humalg before meals and takes Lantus 25units twice a day.  Most Recent Eye Exam: patient reports 1 year Current exercise: none Current diet habits: diabetic    She is thinking trazodone is workjing well she does require the 43m tablet PRN for sleep. Doing well she reports with depression and medication. Denies any worsening symptoms states " I feel so much better" . She denies any suicidal or homicidal ideations or intents. She is aware to use Trazodone on ly as needed and risk of serotonin syndrome and signs to watch for.   insulin lispro insulin lispro (HUMALOG KWIKPEN) 100 UNIT/ML KwikPen Inject 0.1 mLs (10 Units total) into the skin 3 (three) times daily., Starting Wed 07/26/2019, Normal  Dispense: 30 mL  Refills: 6 ordered    Pharmacy: [id number] [place] (Ph: [phone] Order Details Ordered on: 07/26/19  Authorizing provider: ShCloyd StagersMD   Basaglar KwikPen Insulin Glargine ([facility] UNIT/ML Inject 0.5 mLs (50 Units total) into the skin daily., Starting Wed 07/26/2019, Normal  Dispense: 30 mL  Refills: 6 ordered  [id number] [place] [address] (Ph: [phone] Details Ordered on: 07/26/19  Authorizing provider: ShaKelton PillarbtMelanie CrazierD  MEDICATIONS: - Continue Metformin 1000 mg TWice a day  - Increase Ozempic to 1 mg weekly  - Decrease Basaglar to 50 units ONCE a day  - Increase Humalog  to 10 units with each meal   Pertinent Labs: Lab Results  Component Value Date   CHOL 194 07/19/2019   HDL 39 (L) 07/19/2019   LDLCALC 122 (H) 07/19/2019   LDLDIRECT 133 (H) 04/20/2019   TRIG 184 (H) 07/19/2019   CHOLHDL 8.5 (H) 04/20/2019   Lab Results  Component Value Date   NA 138 07/19/2019   K 4.2 07/19/2019   CREATININE 0.67 07/19/2019   GFRNONAA 101 07/19/2019   GFRAA 117 07/19/2019   GLUCOSE 243 (H) 07/19/2019     ---------------------------------------------------------------------------------------------------  Patient Active Problem List   Diagnosis Date Noted  . Elevated hemoglobin ([facility]/15/2021  . Elevated triglycerides with high cholesterol 04/26/2019  . Tinea pedis of left foot 04/20/2019  . Class 2 severe  obesity due to excess calories with serious comorbidity and body mass index (BMI) of 37.0 to 37.9 in adult ([place]) 04/20/2019  . Depression, recurrent ([place]) 04/20/2019  . Type 2 diabetes mellitus with hyperglycemia, with long-term current use of insulin ([place]) 04/20/2019  . Glucose found in urine on examination 04/20/2019  . Routine health maintenance 04/20/2019  . Pain of finger of right hand 04/20/2019  . Hypothyroidism 04/20/2019  . History of use of contraceptive  intrauterine device (IUD) 04/20/2019   Past Medical History:  Diagnosis Date  . Allergy   . Anxiety   . Arthritis   . Asthma   . Depression   . Diabetes mellitus without complication ([place])   . Ketoacidosis 03/2016  . Sleep apnea   . Thyroid disease    Social History   Tobacco Use  . Smoking status: Never Smoker  . Smokeless tobacco: Never Used  Substance Use Topics  . Alcohol use: Not Currently  . Drug use: Never   No Known Allergies     Medications: Outpatient Medications Prior to Visit  Medication Sig Note  . atorvastatin (LIPITOR) 80 MG tablet Take 80 mg by mouth every evening.   . Blood Glucose Monitoring Suppl (ONETOUCH ULTRALINK) w/Device KIT by Does not apply route 3 (three) times daily.   . DULoxetine (CYMBALTA) 60 MG capsule Take 1 capsule (60 mg total) by mouth 2 (two) times daily.   Marland Kitchen icosapent Ethyl (VASCEPA) 1 g capsule Take 2 g by mouth 2 (two) times daily.   . Insulin Glargine (BASAGLAR KWIKPEN) 100 UNIT/ML Inject 0.5 mLs (50 Units total) into the skin daily. 07/26/2019: Patient reports 25units twice daily  . insulin lispro (HUMALOG KWIKPEN) 100 UNIT/ML KwikPen Inject 0.1 mLs (10 Units total) into the skin 3 (three) times daily. 07/26/2019: Patient reports 10units daily before meals  . Insulin Pen Needle 31G X 5 MM MISC 1 Device by Does not apply route in the morning, at noon, in the evening, and at bedtime.   Marland Kitchen levothyroxine (SYNTHROID) 88 MCG tablet Take 88 mcg by mouth daily.   . meloxicam (MOBIC) 15 MG tablet Take 15 mg by mouth daily.   . metFORMIN (GLUCOPHAGE) 1000 MG tablet Take 1 tablet (1,000 mg total) by mouth 2 (two) times daily with a meal.   . Semaglutide, 1 MG/DOSE, (OZEMPIC, 1 MG/DOSE,) 4 MG/3ML SOPN Inject 1 mg into the skin once a week.   . traZODone (DESYREL) 50 MG tablet Take 0.5 tablets (25 mg total) by mouth at bedtime as needed for sleep.   . [DISCONTINUED] DOXYCYCLINE PO Take by mouth.    No facility-administered medications prior to  visit.    Review of Systems  Constitutional: Negative.        " fatigue has improved with diabetes management. She saw endocrinology this morning.   HENT: Negative.   Respiratory: Negative.   Cardiovascular: Negative.   Gastrointestinal: Negative.   Genitourinary: Negative.   Musculoskeletal: Negative.   Skin: Negative.   Neurological: Negative.   Hematological: Negative.   Psychiatric/Behavioral: Positive for sleep disturbance. Negative for agitation, behavioral problems, confusion, decreased concentration, dysphoric mood, hallucinations, self-injury and suicidal ideas. The patient is not nervous/anxious and is not hyperactive.     Last metabolic panel Lab Results  Component Value Date   GLUCOSE 243 (H) 07/19/2019   NA 138 07/19/2019   K 4.2 07/19/2019   CL 97 07/19/2019   CO2 22 07/19/2019   BUN 12 07/19/2019   CREATININE 0.67 07/19/2019  GFRNONAA 101 07/19/2019   GFRAA 117 07/19/2019   CALCIUM 9.5 07/19/2019   PROT 6.6 07/19/2019   ALBUMIN 4.4 07/19/2019   LABGLOB 2.2 07/19/2019   AGRATIO 2.0 07/19/2019   BILITOT 0.7 07/19/2019   ALKPHOS 80 07/19/2019   AST 14 07/19/2019   ALT 17 07/19/2019   Last lipids Lab Results  Component Value Date   CHOL 194 07/19/2019   HDL 39 (L) 07/19/2019   LDLCALC 122 (H) 07/19/2019   LDLDIRECT 133 (H) 04/20/2019   TRIG 184 (H) 07/19/2019   CHOLHDL 8.5 (H) 04/20/2019   Last hemoglobin A1c Lab Results  Component Value Date   HGBA1C 10.4 (A) 07/26/2019      Objective    BP 124/80   Pulse 88   Temp (!) 97.3 F (36.3 C) (Oral)   Resp 16   Wt 282 lb 12.8 oz (128.3 kg)   SpO2 99%   BMI 38.35 kg/m  Wt Readings from Last 3 Encounters:  07/26/19 282 lb 12.8 oz (128.3 kg)  07/26/19 280 lb 9.6 oz (127.3 kg)  04/28/19 279 lb 6.4 oz (126.7 kg)      Physical Exam    Results for orders placed or performed in visit on 07/26/19  POCT HgB A1C  Result Value Ref Range   Hemoglobin A1C 10.4 (A) 4.0 - 5.6 %   HbA1c POC (<>  result, manual entry)     HbA1c, POC (prediabetic range)     HbA1c, POC (controlled diabetic range)      Assessment & Plan     Body mass index (BMI) of 38.0-38.9 in adult  Depression, recurrent (HCC), Chronic  Class 2 severe obesity due to excess calories with serious comorbidity and body mass index (BMI) of 37.0 to 37.9 in adult [facility]), Chronic - Plan: Lipid Panel w/o Chol/HDL Ratio  Hyperlipidemia associated with type 2 diabetes mellitus ([place]) - Plan: Lipid Panel w/o Chol/HDL Ratio, CBC with Differential/Platelet, Comprehensive Metabolic Panel (CMET)  Vitamin D insufficiency - Plan: VITAMIN D 25 Hydroxy (Vit-D Deficiency, Fractures)  Encounter for screening mammogram for malignant neoplasm of breast - Plan: MM Digital Screening  Refills given.  Meds ordered this encounter  Medications  . traZODone (DESYREL) 50 MG tablet    Sig: Take 1 tablet (50 mg total) by mouth at bedtime as needed for sleep.    Dispense:  90 tablet    Refill:  0  . levothyroxine (SYNTHROID) 88 MCG tablet    Sig: Take 1 tablet (88 mcg total) by mouth daily before breakfast.    Dispense:  90 tablet    Refill:  1   Continue to stay on top of diabetes control, monitor for hypoglycemia and hyperglycemia events.  Keep endocrinology appointments.   Orders Placed This Encounter  Procedures  . MM Digital Screening    Order Specific Question:   Reason for Exam (SYMPTOM  OR DIAGNOSIS REQUIRED)    Answer:   screening mammogram    Order Specific Question:   Is the patient pregnant?    Answer:   No    Order Specific Question:   Preferred imaging location?    Answer:   [facility]  . Lipid Panel w/o Chol/HDL Ratio    Standing Status:   Future    Standing Expiration Date:   07/25/2020  . CBC with Differential/Platelet    Standing Status:   Future    Standing Expiration Date:   01/26/2020  . Comprehensive Metabolic Panel (CMET)    Standing Status:  Future    Standing Expiration Date:   07/25/2020  .  VITAMIN D 25 Hydroxy (Vit-D Deficiency, Fractures)    Call to schedule mammogram. She will have PAP smear at next visit and does not want today.   Return in about 6 months (around 01/26/2020), or if symptoms worsen or fail to improve, for at any time for any worsening symptoms, Go to Emergency room/ urgent care if worse.   Advised patient call the office or your primary care doctor for an appointment if no improvement within 72 hours or if any symptoms change or worsen at any time  Advised ER or urgent Care if after hours or on weekend. Call 911 for emergency symptoms at any time.Patinet verbalized understanding of all instructions given/reviewed and treatment plan and has no further questions or concerns at this time.      IWellington Hampshire Taleeyah Bora, FNP, have reviewed all documentation for this visit. The documentation on 07/26/19 for the exam, diagnosis, procedures, and orders are all accurate and complete.   Marcille Buffy, [place] [phone] (phone) [phone] (fax)  [place]

## 2019-07-26 NOTE — Patient Instructions (Addendum)
Fat and Cholesterol Restricted Eating Plan Getting too much fat and cholesterol in your diet may cause health problems. Choosing the right foods helps keep your fat and cholesterol at normal levels. This can keep you from getting certain diseases. Your doctor may recommend an eating plan that includes:  Total fat: ______% or less of total calories a day.  Saturated fat: ______% or less of total calories a day.  Cholesterol: less than _________mg a day.  Fiber: ______g a day. What are tips for following this plan? Meal planning  At meals, divide your plate into four equal parts: ? Fill one-half of your plate with vegetables and green salads. ? Fill one-fourth of your plate with whole grains. ? Fill one-fourth of your plate with low-fat (lean) protein foods.  Eat fish that is high in omega-3 fats at least two times a week. This includes mackerel, tuna, sardines, and salmon.  Eat foods that are high in fiber, such as whole grains, beans, apples, broccoli, carrots, peas, and barley. General tips   Work with your doctor to lose weight if you need to.  Avoid: ? Foods with added sugar. ? Fried foods. ? Foods with partially hydrogenated oils.  Limit alcohol intake to no more than 1 drink a day for nonpregnant women and 2 drinks a day for men. One drink equals 12 oz of beer, 5 oz of wine, or 1 oz of hard liquor. Reading food labels  Check food labels for: ? Trans fats. ? Partially hydrogenated oils. ? Saturated fat (g) in each serving. ? Cholesterol (mg) in each serving. ? Fiber (g) in each serving.  Choose foods with healthy fats, such as: ? Monounsaturated fats. ? Polyunsaturated fats. ? Omega-3 fats.  Choose grain products that have whole grains. Look for the word "whole" as the first word in the ingredient list. Cooking  Cook foods using low-fat methods. These include baking, boiling, grilling, and broiling.  Eat more home-cooked foods. Eat at restaurants and  buffets less often.  Avoid cooking using saturated fats, such as butter, cream, palm oil, palm kernel oil, and coconut oil. Recommended foods  Fruits  All fresh, canned (in natural juice), or frozen fruits. Vegetables  Fresh or frozen vegetables (raw, steamed, roasted, or grilled). Green salads. Grains  Whole grains, such as whole wheat or whole grain breads, crackers, cereals, and pasta. Unsweetened oatmeal, bulgur, barley, quinoa, or brown rice. Corn or whole wheat flour tortillas. Meats and other protein foods  Ground beef (85% or leaner), grass-fed beef, or beef trimmed of fat. Skinless chicken or [place]. Ground chicken or [place]. Pork trimmed of fat. All fish and seafood. Egg whites. Dried beans, peas, or lentils. Unsalted nuts or seeds. Unsalted canned beans. Nut butters without added sugar or oil. Dairy  Low-fat or nonfat dairy products, such as skim or 1% milk, 2% or reduced-fat cheeses, low-fat and fat-free ricotta or cottage cheese, or plain low-fat and nonfat yogurt. Fats and oils  Tub margarine without trans fats. Light or reduced-fat mayonnaise and salad dressings. Avocado. Olive, canola, sesame, or safflower oils. The items listed above may not be a complete list of foods and beverages you can eat. Contact a dietitian for more information. Foods to avoid Fruits  Canned fruit in heavy syrup. Fruit in cream or butter sauce. Fried fruit. Vegetables  Vegetables cooked in cheese, cream, or butter sauce. Fried vegetables. Grains  White bread. White pasta. White rice. Cornbread. Bagels, pastries, and croissants. Crackers and snack foods that contain trans  fat and hydrogenated oils. Meats and other protein foods  Fatty cuts of meat. Ribs, chicken wings, bacon, sausage, bologna, salami, chitterlings, fatback, hot dogs, bratwurst, and packaged lunch meats. Liver and organ meats. Whole eggs and egg yolks. Chicken and [place] with skin. Fried meat. Dairy  Whole or 2% milk,  cream, half-and-half, and cream cheese. Whole milk cheeses. Whole-fat or sweetened yogurt. Full-fat cheeses. Nondairy creamers and whipped toppings. Processed cheese, cheese spreads, and cheese curds. Beverages  Alcohol. Sugar-sweetened drinks such as sodas, lemonade, and fruit drinks. Fats and oils  Butter, stick margarine, lard, shortening, ghee, or bacon fat. Coconut, palm kernel, and palm oils. Sweets and desserts  Corn syrup, sugars, honey, and molasses. Candy. Jam and jelly. Syrup. Sweetened cereals. Cookies, pies, cakes, donuts, muffins, and ice cream. The items listed above may not be a complete list of foods and beverages you should avoid. Contact a dietitian for more information. Summary  Choosing the right foods helps keep your fat and cholesterol at normal levels. This can keep you from getting certain diseases.  At meals, fill one-half of your plate with vegetables and green salads.  Eat high-fiber foods, like whole grains, beans, apples, carrots, peas, and barley.  Limit added sugar, saturated fats, alcohol, and fried foods. This information is not intended to replace advice given to you by your health care provider. Make sure you discuss any questions you have with your health care provider. Document Revised: 11/03/2017 Document Reviewed: 11/17/2016 Elsevier Patient Education  2020 [employer]. Call to schedule your screening mammogram. Your orders have been placed for your exam.  Let our office know if you have questions, concerns, or any difficulty scheduling.  If normal results then yearly screening mammograms are recommended unless you notice  Changes in your breast then you should schedule a follow up office visit. If abnormal results  Further imaging will be warranted and sooner follow up as determined by the radiologist at the [facility].   [facility] [place], [place] [address]  Main: [phone]    Pap  at next visit.   Labs fasting in 6 months.   Trazodone tablets What is this medicine? TRAZODONE (TRAZ oh done) is used to treat depression. This medicine may be used for other purposes; ask your health care provider or pharmacist if you have questions. COMMON BRAND NAME(S): Desyrel What should I tell my health care provider before I take this medicine? They need to know if you have any of these conditions:  attempted suicide or thinking about it  bipolar disorder  bleeding problems  glaucoma  heart disease, or previous heart attack  irregular heart beat  kidney or liver disease  low levels of sodium in the blood  an unusual or allergic reaction to trazodone, other medicines, foods, dyes or preservatives  pregnant or trying to get pregnant  breast-feeding How should I use this medicine? Take this medicine by mouth with a glass of water. Follow the directions on the prescription label. Take this medicine shortly after a meal or a light snack. Take your medicine at regular intervals. Do not take your medicine more often than directed. Do not stop taking this medicine suddenly except upon the advice of your doctor. Stopping this medicine too quickly may cause serious side effects or your condition may worsen. A special MedGuide will be given to you by the pharmacist with each prescription and refill. Be sure to read this information carefully each time. Talk  to your pediatrician regarding the use of this medicine in children. Special care may be needed. Overdosage: If you think you have taken too much of this medicine contact a poison control center or emergency room at once. NOTE: This medicine is only for you. Do not share this medicine with others. What if I miss a dose? If you miss a dose, take it as soon as you can. If it is almost time for your next dose, take only that dose. Do not take double or extra doses. What may interact with this medicine? Do not take this medicine  with any of the following medications:  certain medicines for fungal infections like fluconazole, itraconazole, ketoconazole, posaconazole, voriconazole  cisapride  dronedarone  linezolid  MAOIs like Carbex, Eldepryl, Marplan, Nardil, and Parnate  mesoridazine  methylene blue (injected into a vein)  pimozide  saquinavir  thioridazine This medicine may also interact with the following medications:  alcohol  antiviral medicines for HIV or AIDS  aspirin and aspirin-like medicines  barbiturates like phenobarbital  certain medicines for blood pressure, heart disease, irregular heart beat  certain medicines for depression, anxiety, or psychotic disturbances  certain medicines for migraine headache like almotriptan, eletriptan, frovatriptan, naratriptan, rizatriptan, sumatriptan, zolmitriptan  certain medicines for seizures like carbamazepine and phenytoin  certain medicines for sleep  certain medicines that treat or prevent blood clots like dalteparin, enoxaparin, warfarin  digoxin  fentanyl  lithium  NSAIDS, medicines for pain and inflammation, like ibuprofen or naproxen  other medicines that prolong the QT interval (cause an abnormal heart rhythm) like dofetilide  rasagiline  supplements like St. John's wort, kava kava, valerian  tramadol  tryptophan This list may not describe all possible interactions. Give your health care provider a list of all the medicines, herbs, non-prescription drugs, or dietary supplements you use. Also tell them if you smoke, drink alcohol, or use illegal drugs. Some items may interact with your medicine. What should I watch for while using this medicine? Tell your doctor if your symptoms do not get better or if they get worse. Visit your doctor or health care professional for regular checks on your progress. Because it may take several weeks to see the full effects of this medicine, it is important to continue your treatment as  prescribed by your doctor. Patients and their families should watch out for new or worsening thoughts of suicide or depression. Also watch out for sudden changes in feelings such as feeling anxious, agitated, panicky, irritable, hostile, aggressive, impulsive, severely restless, overly excited and hyperactive, or not being able to sleep. If this happens, especially at the beginning of treatment or after a change in dose, call your health care professional. Dennis Bast may get drowsy or dizzy. Do not drive, use machinery, or do anything that needs mental alertness until you know how this medicine affects you. Do not stand or sit up quickly, especially if you are an older patient. This reduces the risk of dizzy or fainting spells. Alcohol may interfere with the effect of this medicine. Avoid alcoholic drinks. This medicine may cause dry eyes and blurred vision. If you wear contact lenses you may feel some discomfort. Lubricating drops may help. See your eye doctor if the problem does not go away or is severe. Your mouth may get dry. Chewing sugarless gum, sucking hard candy and drinking plenty of water may help. Contact your doctor if the problem does not go away or is severe. What side effects may I notice from receiving this medicine?  Side effects that you should report to your doctor or health care professional as soon as possible:  allergic reactions like skin rash, itching or hives, swelling of the face, lips, or tongue  elevated mood, decreased need for sleep, racing thoughts, impulsive behavior  confusion  fast, irregular heartbeat  feeling faint or lightheaded, falls  feeling agitated, angry, or irritable  loss of balance or coordination  painful or prolonged erections  restlessness, pacing, inability to keep still  suicidal thoughts or other mood changes  tremors  trouble sleeping  seizures  unusual bleeding or bruising Side effects that usually do not require medical attention  (report to your doctor or health care professional if they continue or are bothersome):  change in sex drive or performance  change in appetite or weight  constipation  headache  muscle aches or pains  nausea This list may not describe all possible side effects. Call your doctor for medical advice about side effects. You may report side effects to FDA at 1-800-FDA-1088. Where should I keep my medicine? Keep out of the reach of children. Store at room temperature between 15 and 30 degrees C (59 to 86 degrees F). Protect from light. Keep container tightly closed. Throw away any unused medicine after the expiration date. NOTE: This sheet is a summary. It may not cover all possible information. If you have questions about this medicine, talk to your doctor, pharmacist, or health care provider.  2020 Elsevier/Gold Standard (2018-02-22 11:46:46)

## 2019-07-26 NOTE — Patient Instructions (Addendum)
-   Continue Metformin 1000 mg TWice a day  - Increase Ozempic to 1 mg weekly  - Decrease Basaglar to 50 units ONCE a day  - Increase Humalog  to 10 units with each meal      HOW TO TREAT LOW BLOOD SUGARS (Blood sugar LESS THAN 70 MG/DL)  Please follow the RULE OF 15 for the treatment of hypoglycemia treatment (when your (blood sugars are less than 70 mg/dL)    STEP 1: Take 15 grams of carbohydrates when your blood sugar is low, which includes:   3-4 GLUCOSE TABS  OR  3-4 OZ OF JUICE OR REGULAR SODA OR  ONE TUBE OF GLUCOSE GEL     STEP 2: RECHECK blood sugar in 15 MINUTES STEP 3: If your blood sugar is still low at the 15 minute recheck --> then, go back to STEP 1 and treat AGAIN with another 15 grams of carbohydrates.

## 2019-07-26 NOTE — Progress Notes (Signed)
Name: Valerie Schmitt  Age/ Sex: 54 y.o., female   MRN/ DOB: [id number], [birth year]     PCP: Doreen Beam, FNP   Reason for Endocrinology Evaluation: Type 2 Diabetes Mellitus  Initial Endocrine Consultative Visit: 04/26/2019    PATIENT IDENTIFIER: Valerie Schmitt is a 54 y.o. female with a past medical history of T2DM and Dyslipidemia. The patient has followed with Endocrinology clinic since 04/26/2019 for consultative assistance with management of her diabetes.  DIABETIC HISTORY:  Valerie Schmitt was diagnosed with T2DM in 2016. She has been on Glimepiride in the past without reported intolerance.She moved from [place] 06/2018 and records were not available.  Her hemoglobin A1c was 12.9% on initial visit.  On her initial visit to our clinic, her A1c was 12.9%. She was already on MDI regimen , metformin and Ozempic which were continued.   SUBJECTIVE:   During the last visit (04/26/2019): A1c 12.9% . We continued MDI regimen as well as Ozempic and Metformin.     Today (07/26/2019): Valerie Schmitt is here for a follow up on diabetes management.  She checks her blood sugars occasionally preprandial . The patient has not  had hypoglycemic episodes since the last clinic visit.  Takes Novolog mainly before dinner   HOME DIABETES REGIMEN:  Metformin 1000 mg BID Ozempic 0.5 mg weekly  Basaglar 35 units BID Novolog 5 units TIDQAC     Statin: yes ACE-I/ARB: no    METER DOWNLOAD SUMMARY: Date range evaluated: 4/29-5/02/2020 Fingerstick Blood Glucose Tests = 8 Average Number Tests/Day = 0.6 Overall Mean FS Glucose = 240  BG Ranges: Low = 109 High = 296   Hypoglycemic Events/30 Days: BG < 50 = 0 Episodes of symptomatic severe hypoglycemia = 0    DIABETIC COMPLICATIONS: Microvascular complications:    Denies: CKD, neuropathy, retinopathy   Last Eye Exam: Completed   Macrovascular complications:   Denies: CAD, CVA, PVD   HISTORY:  Past Medical  History:  Past Medical History:  Diagnosis Date  . Allergy   . Anxiety   . Arthritis   . Asthma   . Depression   . Diabetes mellitus without complication ([place])   . Ketoacidosis 03/2016  . Sleep apnea   . Thyroid disease    Past Surgical History:  Past Surgical History:  Procedure Laterality Date  . BREAST SURGERY    . HERNIA REPAIR    . TUBAL LIGATION      Social History:  reports that she has never smoked. She has never used smokeless tobacco. She reports previous alcohol use. She reports that she does not use drugs. Family History:  Family History  Problem Relation Age of Onset  . Diabetes Father   . Scoliosis Daughter   . Autoimmune disease Daughter   . Sleep apnea Daughter   . Anxiety disorder Daughter   . Depression Daughter   . Personality disorder Daughter   . Osteoporosis Maternal Grandmother   . Alzheimer's disease Paternal Grandmother   . Liver disease Paternal Grandfather      HOME MEDICATIONS: Allergies as of 07/26/2019   No Known Allergies     Medication List       Accurate as of Jul 26, 2019  9:18 AM. If you have any questions, ask your nurse or doctor.        atorvastatin 80 MG tablet Commonly known as: LIPITOR Take 80 mg by mouth every evening.   Basaglar KwikPen 100 UNIT/ML INJECT 0.35 MLS (35 UNITS TOTAL) INTO  THE SKIN 2 (TWO) TIMES DAILY.   BD Insulin Syringe U/F 31G X 5/16" 1 ML Misc Generic drug: Insulin Syringe-Needle U-100 1 BAG BY DOES NOT APPLY ROUTE 3 (THREE) TIMES DAILY BETWEEN MEALS AS NEEDED.   DOXYCYCLINE PO Take by mouth.   DULoxetine 60 MG capsule Commonly known as: CYMBALTA Take 1 capsule (60 mg total) by mouth 2 (two) times daily.   Insulin Syringes (Disposable) U-100 1 ML Misc 1 Bag by Does not apply route 3 (three) times daily between meals as needed.   levothyroxine 88 MCG tablet Commonly known as: SYNTHROID Take 88 mcg by mouth daily.   meloxicam 15 MG tablet Commonly known as: MOBIC Take 15 mg by mouth  daily.   metFORMIN 1000 MG tablet Commonly known as: GLUCOPHAGE Take 1 tablet (1,000 mg total) by mouth 2 (two) times daily with a meal.   NOVOLOG Brooks Inject 5 Units into the skin. Before meals   OneTouch UltraLink w/Device Kit by Does not apply route 3 (three) times daily.   Ozempic (0.25 or 0.5 MG/DOSE) 2 MG/1.5ML Sopn Generic drug: Semaglutide(0.25 or 0.5MG/DOS) Inject 0.25 mg into the skin once a week.   traZODone 50 MG tablet Commonly known as: DESYREL Take 0.5 tablets (25 mg total) by mouth at bedtime as needed for sleep.        OBJECTIVE:   Vital Signs: BP 104/72 (BP Location: Left Arm, Patient Position: Sitting, Cuff Size: Large)   Pulse 98   Temp 97.7 F (36.5 C)   Ht 6' (1.829 m)   Wt 280 lb 9.6 oz (127.3 kg)   SpO2 98%   BMI 38.06 kg/m   Wt Readings from Last 3 Encounters:  07/26/19 280 lb 9.6 oz (127.3 kg)  04/28/19 279 lb 6.4 oz (126.7 kg)  04/25/19 279 lb 12.8 oz (126.9 kg)     Exam: General: Pt appears well and is in NAD  Lungs: Clear with good BS bilat with no rales, rhonchi, or wheezes  Heart: RRR with normal S1 and S2 and no gallops; no murmurs; no rub  Abdomen: Normoactive bowel sounds, soft, nontender, without masses or organomegaly palpable  Extremities: No pretibial edema.   Neuro: MS is good with appropriate affect, pt is alert and Ox3      DM foot exam: 04/25/2019  The skin of the feet is shows 2 superficial cuts at the plantar surface of the left 2nd and right 2 toes.  The pedal pulses are 2+ on right and 2+ on left. The sensation is intact to a screening 5.07, 10 gram monofilament bilaterally    DATA REVIEWED:  Lab Results  Component Value Date   HGBA1C 10.4 (A) 07/26/2019   HGBA1C 12.9 (H) 04/20/2019   HGBA1C 13.1 (A) 04/20/2019   Lab Results  Component Value Date   MICROALBUR 50 04/20/2019   LDLCALC 122 (H) 07/19/2019   CREATININE 0.67 07/19/2019    Lab Results  Component Value Date   CHOL 194 07/19/2019   HDL 39  (L) 07/19/2019   LDLCALC 122 (H) 07/19/2019   LDLDIRECT 133 (H) 04/20/2019   TRIG 184 (H) 07/19/2019   CHOLHDL 8.5 (H) 04/20/2019         ASSESSMENT / PLAN / RECOMMENDATIONS:   1) Type 2 Diabetes Mellitus, Poorly controlled, Without complications - Most recent A1c of 10.4 %. Goal A1c < 7.0 %.     - A1c down from 12.9%, we discussed the goal of 7.0%  - Pt with tight BG yesterday  and symptomatic hypoglycemia, will reduce basaglar as below - I have encouraged her to take prandial insulin with EACH meal , rather then supper only.  - Will consider SGLT-2 inhibitors in the future   MEDICATIONS: - Continue Metformin 1000 mg TWice a day  - Increase Ozempic to 1 mg weekly  - Decrease Basaglar to 50 units ONCE a day  - Increase Humalog  to 10 units with each meal   EDUCATION / INSTRUCTIONS:  BG monitoring instructions: Patient is instructed to check her blood sugars 3 times a day, before each meal .  Call [facility] if: BG persistently < 70 or > 300. . I reviewed the Rule of 15 for the treatment of hypoglycemia in detail with the patient. Literature supplied.     2) Diabetic complications:   Eye: Does not have known diabetic retinopathy.   Neuro/ Feet: Does not have known diabetic peripheral neuropathy .   Renal: Patient does not have known baseline CKD. She   is not on an ACEI/ARB at present.     F/U in 3 months    Signed electronically by: Mack Guise, MD  [facility]  [facility] [address] [place], [place] [address] Phone: [phone] FAX: [phone]   CC: Doreen Beam, [place] [address] Phone: [phone]  Fax: [phone]  Return to Endocrinology clinic as below: Future Appointments  Date Time Provider Antonito  07/26/2019 10:20 AM Flinchum, Kelby Aline, FNP [facility]

## 2019-08-21 ENCOUNTER — Other Ambulatory Visit: Payer: Self-pay | Admitting: Adult Health

## 2019-08-21 MED ORDER — LEVOTHYROXINE SODIUM 88 MCG PO TABS: 88.0000 ug | ORAL_TABLET | Freq: Every day | ORAL | 1 refills | Status: DC

## 2019-08-21 MED ORDER — DULOXETINE HCL 60 MG PO CPEP: 60.0000 mg | ORAL_CAPSULE | Freq: Two times a day (BID) | ORAL | 1 refills | Status: DC

## 2019-08-21 NOTE — Telephone Encounter (Signed)
[facility] faxed refill request for the following medications:  DULoxetine (CYMBALTA) 60 MG capsule - 90 day supply   Please advise.  Thanks, [employer]

## 2019-08-21 NOTE — Telephone Encounter (Addendum)
[facility] faxed refill request for the following medications:  levothyroxine (SYNTHROID)  50 MCG tablet  (88 MCG on patient chart) traZODone (DESYREL) 50 MG tablet  Please advise.  Thanks, [employer]

## 2019-08-23 ENCOUNTER — Other Ambulatory Visit: Payer: Self-pay | Admitting: Adult Health

## 2019-08-23 MED ORDER — TRAZODONE HCL 50 MG PO TABS: 50.0000 mg | ORAL_TABLET | Freq: Every evening | ORAL | 0 refills | Status: DC | PRN

## 2019-08-23 NOTE — Telephone Encounter (Signed)
[facility] is requesting refills on Trazodone 50 mg.

## 2019-11-02 ENCOUNTER — Ambulatory Visit: Payer: Managed Care, Other (non HMO) | Admitting: Internal Medicine

## 2019-11-24 ENCOUNTER — Ambulatory Visit: Payer: Managed Care, Other (non HMO) | Admitting: Internal Medicine

## 2020-01-31 ENCOUNTER — Ambulatory Visit: Payer: Managed Care, Other (non HMO) | Admitting: Adult Health

## 2020-02-21 ENCOUNTER — Encounter: Payer: Self-pay | Admitting: Adult Health

## 2020-02-21 ENCOUNTER — Other Ambulatory Visit: Payer: Self-pay

## 2020-02-21 ENCOUNTER — Ambulatory Visit: Payer: Managed Care, Other (non HMO) | Admitting: Adult Health

## 2020-02-21 VITALS — BP 119/75 | HR 92 | Temp 97.5°F | Resp 16 | Wt 270.8 lb

## 2020-02-21 DIAGNOSIS — Z6836 Body mass index (BMI) 36.0-36.9, adult: Secondary | ICD-10-CM

## 2020-02-21 DIAGNOSIS — F339 Major depressive disorder, recurrent, unspecified: Secondary | ICD-10-CM

## 2020-02-21 DIAGNOSIS — E039 Hypothyroidism, unspecified: Secondary | ICD-10-CM | POA: Diagnosis not present

## 2020-02-21 DIAGNOSIS — G4700 Insomnia, unspecified: Secondary | ICD-10-CM

## 2020-02-21 MED ORDER — TRAZODONE HCL 100 MG PO TABS: 100.0000 mg | ORAL_TABLET | Freq: Every day | ORAL | 0 refills | Status: DC

## 2020-02-21 NOTE — Patient Instructions (Addendum)
Call to schedule your screening mammogram. Your orders have been placed for your exam.  Let our office know if you have questions, concerns, or any difficulty scheduling.  If normal results then yearly screening mammograms are recommended unless you notice  Changes in your breast then you should schedule a follow up office visit. If abnormal results  Further imaging will be warranted and sooner follow up as determined by the radiologist at the [facility].   [facility] [place], [place] [id number]  Main: [phone]     Hypothyroidism  Hypothyroidism is when the thyroid gland does not make enough of certain hormones (it is underactive). The thyroid gland is a small gland located in the lower front part of the neck, just in front of the windpipe (trachea). This gland makes hormones that help control how the body uses food for energy (metabolism) as well as how the heart and brain function. These hormones also play a role in keeping your bones strong. When the thyroid is underactive, it produces too little of the hormones thyroxine (T4) and triiodothyronine (T3). What are the causes? This condition may be caused by:  Hashimoto's disease. This is a disease in which the body's disease-fighting system (immune system) attacks the thyroid gland. This is the most common cause.  Viral infections.  Pregnancy.  Certain medicines.  Birth defects.  Past radiation treatments to the head or neck for cancer.  Past treatment with radioactive iodine.  Past exposure to radiation in the environment.  Past surgical removal of part or all of the thyroid.  Problems with a gland in the center of the brain (pituitary gland).  Lack of enough iodine in the diet. What increases the risk? You are more likely to develop this condition if:  You are female.  You have a family history of thyroid conditions.  You use a medicine called lithium.  You  take medicines that affect the immune system (immunosuppressants). What are the signs or symptoms? Symptoms of this condition include:  Feeling as though you have no energy (lethargy).  Not being able to tolerate cold.  Weight gain that is not explained by a change in diet or exercise habits.  Lack of appetite.  Dry skin.  Coarse hair.  Menstrual irregularity.  Slowing of thought processes.  Constipation.  Sadness or depression. How is this diagnosed? This condition may be diagnosed based on:  Your symptoms, your medical history, and a physical exam.  Blood tests. You may also have imaging tests, such as an ultrasound or MRI. How is this treated? This condition is treated with medicine that replaces the thyroid hormones that your body does not make. After you begin treatment, it may take several weeks for symptoms to go away. Follow these instructions at home:  Take over-the-counter and prescription medicines only as told by your health care provider.  If you start taking any new medicines, tell your health care provider.  Keep all follow-up visits as told by your health care provider. This is important. ? As your condition improves, your dosage of thyroid hormone medicine may change. ? You will need to have blood tests regularly so that your health care provider can monitor your condition. Contact a health care provider if:  Your symptoms do not get better with treatment.  You are taking thyroid replacement medicine and you: ? Sweat a lot. ? Have tremors. ? Feel anxious. ? Lose weight rapidly. ? Cannot tolerate heat. ? Have  emotional swings. ? Have diarrhea. ? Feel weak. Get help right away if you have:  Chest pain.  An irregular heartbeat.  A rapid heartbeat.  Difficulty breathing. Summary  Hypothyroidism is when the thyroid gland does not make enough of certain hormones (it is underactive).  When the thyroid is underactive, it produces too little  of the hormones thyroxine (T4) and triiodothyronine (T3).  The most common cause is Hashimoto's disease, a disease in which the body's disease-fighting system (immune system) attacks the thyroid gland. The condition can also be caused by viral infections, medicine, pregnancy, or past radiation treatment to the head or neck.  Symptoms may include weight gain, dry skin, constipation, feeling as though you do not have energy, and not being able to tolerate cold.  This condition is treated with medicine to replace the thyroid hormones that your body does not make. This information is not intended to replace advice given to you by your health care provider. Make sure you discuss any questions you have with your health care provider. Document Revised: 02/12/2017 Document Reviewed: 02/10/2017 Elsevier Patient Education  [place]. Trazodone tablets What is this medicine? TRAZODONE (TRAZ oh done) is used to treat depression. This medicine may be used for other purposes; ask your health care provider or pharmacist if you have questions. COMMON BRAND NAME(S): Desyrel What should I tell my health care provider before I take this medicine? They need to know if you have any of these conditions:  attempted suicide or thinking about it  bipolar disorder  bleeding problems  glaucoma  heart disease, or previous heart attack  irregular heart beat  kidney or liver disease  low levels of sodium in the blood  an unusual or allergic reaction to trazodone, other medicines, foods, dyes or preservatives  pregnant or trying to get pregnant  breast-feeding How should I use this medicine? Take this medicine by mouth with a glass of water. Follow the directions on the prescription label. Take this medicine shortly after a meal or a light snack. Take your medicine at regular intervals. Do not take your medicine more often than directed. Do not stop taking this medicine suddenly except upon the  advice of your doctor. Stopping this medicine too quickly may cause serious side effects or your condition may worsen. A special MedGuide will be given to you by the pharmacist with each prescription and refill. Be sure to read this information carefully each time. Talk to your pediatrician regarding the use of this medicine in children. Special care may be needed. Overdosage: If you think you have taken too much of this medicine contact a poison control center or emergency room at once. NOTE: This medicine is only for you. Do not share this medicine with others. What if I miss a dose? If you miss a dose, take it as soon as you can. If it is almost time for your next dose, take only that dose. Do not take double or extra doses. What may interact with this medicine? Do not take this medicine with any of the following medications:  certain medicines for fungal infections like fluconazole, itraconazole, ketoconazole, posaconazole, voriconazole  cisapride  dronedarone  linezolid  MAOIs like Carbex, Eldepryl, Marplan, Nardil, and Parnate  mesoridazine  methylene blue (injected into a vein)  pimozide  saquinavir  thioridazine This medicine may also interact with the following medications:  alcohol  antiviral medicines for HIV or AIDS  aspirin and aspirin-like medicines  barbiturates like phenobarbital  certain medicines  for blood pressure, heart disease, irregular heart beat  certain medicines for depression, anxiety, or psychotic disturbances  certain medicines for migraine headache like almotriptan, eletriptan, frovatriptan, naratriptan, rizatriptan, sumatriptan, zolmitriptan  certain medicines for seizures like carbamazepine and phenytoin  certain medicines for sleep  certain medicines that treat or prevent blood clots like dalteparin, enoxaparin, warfarin  digoxin  fentanyl  lithium  NSAIDS, medicines for pain and inflammation, like ibuprofen or  naproxen  other medicines that prolong the QT interval (cause an abnormal heart rhythm) like dofetilide  rasagiline  supplements like St. John's wort, kava kava, valerian  tramadol  tryptophan This list may not describe all possible interactions. Give your health care provider a list of all the medicines, herbs, non-prescription drugs, or dietary supplements you use. Also tell them if you smoke, drink alcohol, or use illegal drugs. Some items may interact with your medicine. What should I watch for while using this medicine? Tell your doctor if your symptoms do not get better or if they get worse. Visit your doctor or health care professional for regular checks on your progress. Because it may take several weeks to see the full effects of this medicine, it is important to continue your treatment as prescribed by your doctor. Patients and their families should watch out for new or worsening thoughts of suicide or depression. Also watch out for sudden changes in feelings such as feeling anxious, agitated, panicky, irritable, hostile, aggressive, impulsive, severely restless, overly excited and hyperactive, or not being able to sleep. If this happens, especially at the beginning of treatment or after a change in dose, call your health care professional. Dennis Bast may get drowsy or dizzy. Do not drive, use machinery, or do anything that needs mental alertness until you know how this medicine affects you. Do not stand or sit up quickly, especially if you are an older patient. This reduces the risk of dizzy or fainting spells. Alcohol may interfere with the effect of this medicine. Avoid alcoholic drinks. This medicine may cause dry eyes and blurred vision. If you wear contact lenses you may feel some discomfort. Lubricating drops may help. See your eye doctor if the problem does not go away or is severe. Your mouth may get dry. Chewing sugarless gum, sucking hard candy and drinking plenty of water may help.  Contact your doctor if the problem does not go away or is severe. What side effects may I notice from receiving this medicine? Side effects that you should report to your doctor or health care professional as soon as possible:  allergic reactions like skin rash, itching or hives, swelling of the face, lips, or tongue  elevated mood, decreased need for sleep, racing thoughts, impulsive behavior  confusion  fast, irregular heartbeat  feeling faint or lightheaded, falls  feeling agitated, angry, or irritable  loss of balance or coordination  painful or prolonged erections  restlessness, pacing, inability to keep still  suicidal thoughts or other mood changes  tremors  trouble sleeping  seizures  unusual bleeding or bruising Side effects that usually do not require medical attention (report to your doctor or health care professional if they continue or are bothersome):  change in sex drive or performance  change in appetite or weight  constipation  headache  muscle aches or pains  nausea This list may not describe all possible side effects. Call your doctor for medical advice about side effects. You may report side effects to FDA at 1-800-FDA-1088. Where should I keep my medicine?  Keep out of the reach of children. Store at room temperature between 15 and 30 degrees C (59 to 86 degrees F). Protect from light. Keep container tightly closed. Throw away any unused medicine after the expiration date. NOTE: This sheet is a summary. It may not cover all possible information. If you have questions about this medicine, talk to your doctor, pharmacist, or health care provider.  2020 Elsevier/Gold Standard (2018-02-22 11:46:46)   Calorie Counting for Weight Loss Calories are units of energy. Your body needs a certain amount of calories from food to keep you going throughout the day. When you eat more calories than your body needs, your body stores the extra calories as fat.  When you eat fewer calories than your body needs, your body burns fat to get the energy it needs. Calorie counting means keeping track of how many calories you eat and drink each day. Calorie counting can be helpful if you need to lose weight. If you make sure to eat fewer calories than your body needs, you should lose weight. Ask your health care provider what a healthy weight is for you. For calorie counting to work, you will need to eat the right number of calories in a day in order to lose a healthy amount of weight per week. A dietitian can help you determine how many calories you need in a day and will give you suggestions on how to reach your calorie goal.  A healthy amount of weight to lose per week is usually 1-2 lb (0.5-0.9 kg). This usually means that your daily calorie intake should be reduced by 500-750 calories.  Eating 1,200 - 1,500 calories per day can help most women lose weight.  Eating 1,500 - 1,800 calories per day can help most men lose weight. What is my plan? My goal is to have __________ calories per day. If I have this many calories per day, I should lose around __________ pounds per week. What do I need to know about calorie counting? In order to meet your daily calorie goal, you will need to:  Find out how many calories are in each food you would like to eat. Try to do this before you eat.  Decide how much of the food you plan to eat.  Write down what you ate and how many calories it had. Doing this is called keeping a food log. To successfully lose weight, it is important to balance calorie counting with a healthy lifestyle that includes regular activity. Aim for 150 minutes of moderate exercise (such as walking) or 75 minutes of vigorous exercise (such as running) each week. Where do I find calorie information?  The number of calories in a food can be found on a Nutrition Facts label. If a food does not have a Nutrition Facts label, try to look up the calories  online or ask your dietitian for help. Remember that calories are listed per serving. If you choose to have more than one serving of a food, you will have to multiply the calories per serving by the amount of servings you plan to eat. For example, the label on a package of bread might say that a serving size is 1 slice and that there are 90 calories in a serving. If you eat 1 slice, you will have eaten 90 calories. If you eat 2 slices, you will have eaten 180 calories. How do I keep a food log? Immediately after each meal, record the following information in your food log:  What you ate. Don't forget to include toppings, sauces, and other extras on the food.  How much you ate. This can be measured in cups, ounces, or number of items.  How many calories each food and drink had.  The total number of calories in the meal. Keep your food log near you, such as in a small notebook in your pocket, or use a mobile app or website. Some programs will calculate calories for you and show you how many calories you have left for the day to meet your goal. What are some calorie counting tips?   Use your calories on foods and drinks that will fill you up and not leave you hungry: ? Some examples of foods that fill you up are nuts and nut butters, vegetables, lean proteins, and high-fiber foods like whole grains. High-fiber foods are foods with more than 5 g fiber per serving. ? Drinks such as sodas, specialty coffee drinks, alcohol, and juices have a lot of calories, yet do not fill you up.  Eat nutritious foods and avoid empty calories. Empty calories are calories you get from foods or beverages that do not have many vitamins or protein, such as candy, sweets, and soda. It is better to have a nutritious high-calorie food (such as an avocado) than a food with few nutrients (such as a bag of chips).  Know how many calories are in the foods you eat most often. This will help you calculate calorie counts  faster.  Pay attention to calories in drinks. Low-calorie drinks include water and unsweetened drinks.  Pay attention to nutrition labels for "low fat" or "fat free" foods. These foods sometimes have the same amount of calories or more calories than the full fat versions. They also often have added sugar, starch, or salt, to make up for flavor that was removed with the fat.  Find a way of tracking calories that works for you. Get creative. Try different apps or programs if writing down calories does not work for you. What are some portion control tips?  Know how many calories are in a serving. This will help you know how many servings of a certain food you can have.  Use a measuring cup to measure serving sizes. You could also try weighing out portions on a kitchen scale. With time, you will be able to estimate serving sizes for some foods.  Take some time to put servings of different foods on your favorite plates, bowls, and cups so you know what a serving looks like.  Try not to eat straight from a bag or box. Doing this can lead to overeating. Put the amount you would like to eat in a cup or on a plate to make sure you are eating the right portion.  Use smaller plates, glasses, and bowls to prevent overeating.  Try not to multitask (for example, watch TV or use your computer) while eating. If it is time to eat, sit down at a table and enjoy your food. This will help you to know when you are full. It will also help you to be aware of what you are eating and how much you are eating. What are tips for following this plan? Reading food labels  Check the calorie count compared to the serving size. The serving size may be smaller than what you are used to eating.  Check the source of the calories. Make sure the food you are eating is high in vitamins and protein and low in  saturated and trans fats. Shopping  Read nutrition labels while you shop. This will help you make healthy decisions  before you decide to purchase your food.  Make a grocery list and stick to it. Cooking  Try to cook your favorite foods in a healthier way. For example, try baking instead of frying.  Use low-fat dairy products. Meal planning  Use more fruits and vegetables. Half of your plate should be fruits and vegetables.  Include lean proteins like poultry and fish. How do I count calories when eating out?  Ask for smaller portion sizes.  Consider sharing an entree and sides instead of getting your own entree.  If you get your own entree, eat only half. Ask for a box at the beginning of your meal and put the rest of your entree in it so you are not tempted to eat it.  If calories are listed on the menu, choose the lower calorie options.  Choose dishes that include vegetables, fruits, whole grains, low-fat dairy products, and lean protein.  Choose items that are boiled, broiled, grilled, or steamed. Stay away from items that are buttered, battered, fried, or served with cream sauce. Items labeled "crispy" are usually fried, unless stated otherwise.  Choose water, low-fat milk, unsweetened iced tea, or other drinks without added sugar. If you want an alcoholic beverage, choose a lower calorie option such as a glass of wine or light beer.  Ask for dressings, sauces, and syrups on the side. These are usually high in calories, so you should limit the amount you eat.  If you want a salad, choose a garden salad and ask for grilled meats. Avoid extra toppings like bacon, cheese, or fried items. Ask for the dressing on the side, or ask for olive oil and vinegar or lemon to use as dressing.  Estimate how many servings of a food you are given. For example, a serving of cooked rice is  cup or about the size of half a baseball. Knowing serving sizes will help you be aware of how much food you are eating at restaurants. The list below tells you how big or small some common portion sizes are based on everyday  objects: ? 1 oz--4 stacked dice. ? 3 oz--1 deck of cards. ? 1 tsp--1 die. ? 1 Tbsp-- a ping-pong ball. ? 2 Tbsp--1 ping-pong ball. ?  cup-- baseball. ? 1 cup--1 baseball. Summary  Calorie counting means keeping track of how many calories you eat and drink each day. If you eat fewer calories than your body needs, you should lose weight.  A healthy amount of weight to lose per week is usually 1-2 lb (0.5-0.9 kg). This usually means reducing your daily calorie intake by 500-750 calories.  The number of calories in a food can be found on a Nutrition Facts label. If a food does not have a Nutrition Facts label, try to look up the calories online or ask your dietitian for help.  Use your calories on foods and drinks that will fill you up, and not on foods and drinks that will leave you hungry.  Use smaller plates, glasses, and bowls to prevent overeating. This information is not intended to replace advice given to you by your health care provider. Make sure you discuss any questions you have with your health care provider. Document Revised: 11/19/2017 Document Reviewed: 01/31/2016 Elsevier Patient Education  [place] and Cholesterol Restricted Eating Plan Getting too much fat and cholesterol in your diet  may cause health problems. Choosing the right foods helps keep your fat and cholesterol at normal levels. This can keep you from getting certain diseases. Your doctor may recommend an eating plan that includes:  Total fat: ______% or less of total calories a day.  Saturated fat: ______% or less of total calories a day.  Cholesterol: less than _________mg a day.  Fiber: ______g a day. What are tips for following this plan? Meal planning  At meals, divide your plate into four equal parts: ? Fill one-half of your plate with vegetables and green salads. ? Fill one-fourth of your plate with whole grains. ? Fill one-fourth of your plate with low-fat (lean) protein  foods.  Eat fish that is high in omega-3 fats at least two times a week. This includes mackerel, tuna, sardines, and salmon.  Eat foods that are high in fiber, such as whole grains, beans, apples, broccoli, carrots, peas, and barley. General tips   Work with your doctor to lose weight if you need to.  Avoid: ? Foods with added sugar. ? Fried foods. ? Foods with partially hydrogenated oils.  Limit alcohol intake to no more than 1 drink a day for nonpregnant women and 2 drinks a day for men. One drink equals 12 oz of beer, 5 oz of wine, or 1 oz of hard liquor. Reading food labels  Check food labels for: ? Trans fats. ? Partially hydrogenated oils. ? Saturated fat (g) in each serving. ? Cholesterol (mg) in each serving. ? Fiber (g) in each serving.  Choose foods with healthy fats, such as: ? Monounsaturated fats. ? Polyunsaturated fats. ? Omega-3 fats.  Choose grain products that have whole grains. Look for the word "whole" as the first word in the ingredient list. Cooking  Cook foods using low-fat methods. These include baking, boiling, grilling, and broiling.  Eat more home-cooked foods. Eat at restaurants and buffets less often.  Avoid cooking using saturated fats, such as butter, cream, palm oil, palm kernel oil, and coconut oil. Recommended foods  Fruits  All fresh, canned (in natural juice), or frozen fruits. Vegetables  Fresh or frozen vegetables (raw, steamed, roasted, or grilled). Green salads. Grains  Whole grains, such as whole wheat or whole grain breads, crackers, cereals, and pasta. Unsweetened oatmeal, bulgur, barley, quinoa, or brown rice. Corn or whole wheat flour tortillas. Meats and other protein foods  Ground beef (85% or leaner), grass-fed beef, or beef trimmed of fat. Skinless chicken or [place]. Ground chicken or [place]. Pork trimmed of fat. All fish and seafood. Egg whites. Dried beans, peas, or lentils. Unsalted nuts or seeds. Unsalted canned  beans. Nut butters without added sugar or oil. Dairy  Low-fat or nonfat dairy products, such as skim or 1% milk, 2% or reduced-fat cheeses, low-fat and fat-free ricotta or cottage cheese, or plain low-fat and nonfat yogurt. Fats and oils  Tub margarine without trans fats. Light or reduced-fat mayonnaise and salad dressings. Avocado. Olive, canola, sesame, or safflower oils. The items listed above may not be a complete list of foods and beverages you can eat. Contact a dietitian for more information. Foods to avoid Fruits  Canned fruit in heavy syrup. Fruit in cream or butter sauce. Fried fruit. Vegetables  Vegetables cooked in cheese, cream, or butter sauce. Fried vegetables. Grains  White bread. White pasta. White rice. Cornbread. Bagels, pastries, and croissants. Crackers and snack foods that contain trans fat and hydrogenated oils. Meats and other protein foods  Fatty cuts of meat. Ribs,  chicken wings, bacon, sausage, bologna, salami, chitterlings, fatback, hot dogs, bratwurst, and packaged lunch meats. Liver and organ meats. Whole eggs and egg yolks. Chicken and [place] with skin. Fried meat. Dairy  Whole or 2% milk, cream, half-and-half, and cream cheese. Whole milk cheeses. Whole-fat or sweetened yogurt. Full-fat cheeses. Nondairy creamers and whipped toppings. Processed cheese, cheese spreads, and cheese curds. Beverages  Alcohol. Sugar-sweetened drinks such as sodas, lemonade, and fruit drinks. Fats and oils  Butter, stick margarine, lard, shortening, ghee, or bacon fat. Coconut, palm kernel, and palm oils. Sweets and desserts  Corn syrup, sugars, honey, and molasses. Candy. Jam and jelly. Syrup. Sweetened cereals. Cookies, pies, cakes, donuts, muffins, and ice cream. The items listed above may not be a complete list of foods and beverages you should avoid. Contact a dietitian for more information. Summary  Choosing the right foods helps keep your fat and cholesterol at  normal levels. This can keep you from getting certain diseases.  At meals, fill one-half of your plate with vegetables and green salads.  Eat high-fiber foods, like whole grains, beans, apples, carrots, peas, and barley.  Limit added sugar, saturated fats, alcohol, and fried foods. This information is not intended to replace advice given to you by your health care provider. Make sure you discuss any questions you have with your health care provider. Document Revised: 11/03/2017 Document Reviewed: 11/17/2016 Elsevier Patient Education  [place].

## 2020-02-21 NOTE — Progress Notes (Signed)
Established patient visit   Patient: Valerie Schmitt   DOB: 1965-12-22   54 y.o. Female  MRN: [id number] Visit Date: 02/21/2020  Today's healthcare provider: Marcille Buffy, FNP   Chief Complaint  Patient presents with  . Insomnia   Subjective    HPI  Follow up for insomnia   The patient was last seen for this 6 months ago. Changes made at last visit include patient was started on Trazodone $RemoveBefo'50mg'CSRmYMJHHjR$ - she is using $Remove'100mg'LECoWDk$  to get sleep as needed.   She reports fair compliance with treatment. Patient states she takes medication PRN BID She feels that condition is Improved. She is not having side effects.   She feels well. " christmas makes me happy".   She has no concerns today.  Due for TSH follow up currently taking  Synthroid 88 mcg daily.   Patient  denies any fever, body aches,chills, rash, chest pain, shortness of breath, nausea, vomiting, or diarrhea.  Denies dizziness, lightheadedness, pre syncopal or syncopal episodes.    Mammogram was ordered in May, she will call to schedule.  Overdue for PAP she declined today, denies any concerns.  -----------------------------------------------------------------------------------------   Patient Active Problem List   Diagnosis Date Noted  . BMI 36.0-36.9,adult 07/26/2019  . Elevated triglycerides with high cholesterol 04/26/2019  . Tinea pedis of left foot 04/20/2019  . Depression, recurrent ([place]) 04/20/2019  . Type 2 diabetes mellitus with hyperglycemia, with long-term current use of insulin ([place]) 04/20/2019  . Glucose found in urine on examination 04/20/2019  . Routine health maintenance 04/20/2019  . Pain of finger of right hand 04/20/2019  . Hypothyroidism 04/20/2019  . History of use of contraceptive intrauterine device (IUD) 04/20/2019   Past Medical History:  Diagnosis Date  . Allergy   . Anxiety   . Arthritis   . Asthma   . Depression   . Diabetes mellitus without complication ([place])   .  Ketoacidosis 03/2016  . Sleep apnea   . Thyroid disease    Social History   Socioeconomic History  . Marital status: Divorced    Spouse name: Not on file  . Number of children: Not on file  . Years of education: Not on file  . Highest education level: Not on file  Occupational History  . Not on file  Tobacco Use  . Smoking status: Never Smoker  . Smokeless tobacco: Never Used  Substance and Sexual Activity  . Alcohol use: Not Currently  . Drug use: Never  . Sexual activity: Not Currently  Other Topics Concern  . Not on file  Social History Narrative  . Not on file   Social Determinants of Health   Financial Resource Strain:   . Difficulty of Paying Living Expenses: Not on file  Food Insecurity:   . Worried About Charity fundraiser in the Last Year: Not on file  . Ran Out of Food in the Last Year: Not on file  Transportation Needs:   . Lack of Transportation (Medical): Not on file  . Lack of Transportation (Non-Medical): Not on file  Physical Activity:   . Days of Exercise per Week: Not on file  . Minutes of Exercise per Session: Not on file  Stress:   . Feeling of Stress : Not on file  Social Connections:   . Frequency of Communication with Friends and Family: Not on file  . Frequency of Social Gatherings with Friends and Family: Not on file  . Attends Religious Services: Not  on file  . Active Member of Clubs or Organizations: Not on file  . Attends Archivist Meetings: Not on file  . Marital Status: Not on file  Intimate Partner Violence:   . Fear of Current or Ex-Partner: Not on file  . Emotionally Abused: Not on file  . Physically Abused: Not on file  . Sexually Abused: Not on file       Medications: Outpatient Medications Prior to Visit  Medication Sig  . atorvastatin (LIPITOR) 80 MG tablet Take 80 mg by mouth every evening.  . Blood Glucose Monitoring Suppl (ONETOUCH ULTRALINK) w/Device KIT by Does not apply route 3 (three) times daily.  .  DULoxetine (CYMBALTA) 60 MG capsule Take 1 capsule (60 mg total) by mouth 2 (two) times daily.  Marland Kitchen icosapent Ethyl (VASCEPA) 1 g capsule Take 2 g by mouth 2 (two) times daily.  . Insulin Glargine (BASAGLAR KWIKPEN) 100 UNIT/ML Inject 0.5 mLs (50 Units total) into the skin daily.  . insulin lispro (HUMALOG KWIKPEN) 100 UNIT/ML KwikPen Inject 0.1 mLs (10 Units total) into the skin 3 (three) times daily.  . Insulin Pen Needle 31G X 5 MM MISC 1 Device by Does not apply route in the morning, at noon, in the evening, and at bedtime.  Marland Kitchen levothyroxine (SYNTHROID) 88 MCG tablet Take 1 tablet (88 mcg total) by mouth daily before breakfast.  . metFORMIN (GLUCOPHAGE) 1000 MG tablet Take 1 tablet (1,000 mg total) by mouth 2 (two) times daily with a meal.  . Semaglutide, 1 MG/DOSE, (OZEMPIC, 1 MG/DOSE,) 4 MG/3ML SOPN Inject 1 mg into the skin once a week.  . [DISCONTINUED] traZODone (DESYREL) 50 MG tablet Take 1 tablet (50 mg total) by mouth at bedtime as needed for sleep.  . [DISCONTINUED] meloxicam (MOBIC) 15 MG tablet Take 15 mg by mouth daily. (Patient not taking: Reported on 02/21/2020)   No facility-administered medications prior to visit.    Review of Systems  Last CBC Lab Results  Component Value Date   WBC 6.9 07/19/2019   HGB 15.3 07/19/2019   HCT 47.2 (H) 07/19/2019   MCV 91 07/19/2019   MCH 29.4 07/19/2019   RDW 12.4 07/19/2019   PLT 245 78/93/8101   Last metabolic panel Lab Results  Component Value Date   GLUCOSE 243 (H) 07/19/2019   NA 138 07/19/2019   K 4.2 07/19/2019   CL 97 07/19/2019   CO2 22 07/19/2019   BUN 12 07/19/2019   CREATININE 0.67 07/19/2019   GFRNONAA 101 07/19/2019   GFRAA 117 07/19/2019   CALCIUM 9.5 07/19/2019   PROT 6.6 07/19/2019   ALBUMIN 4.4 07/19/2019   LABGLOB 2.2 07/19/2019   AGRATIO 2.0 07/19/2019   BILITOT 0.7 07/19/2019   ALKPHOS 80 07/19/2019   AST 14 07/19/2019   ALT 17 07/19/2019      Objective    BP 119/75   Pulse 92   Temp (!) 97.5  F (36.4 C) (Oral)   Resp 16   Wt 270 lb 12.8 oz (122.8 kg)   SpO2 99%   BMI 36.73 kg/m  BP Readings from Last 3 Encounters:  02/21/20 119/75  07/26/19 124/80  07/26/19 104/72   Wt Readings from Last 3 Encounters:  02/21/20 270 lb 12.8 oz (122.8 kg)  07/26/19 282 lb 12.8 oz (128.3 kg)  07/26/19 280 lb 9.6 oz (127.3 kg)      Physical Exam Constitutional:      General: She is not in acute distress.  Appearance: Normal appearance. She is obese. She is not ill-appearing, toxic-appearing or diaphoretic.     Comments: Patient appers well, not sickly. Speaking in complete sentences. Patient moves on and off of exam table and in room without difficulty. Gait is normal in hall and in room. Patient is oriented to person place time and situation. Patient answers questions appropriately and engages eye contact and verbal dialect with provider.    HENT:     Head: Normocephalic and atraumatic.     Right Ear: External ear normal.     Left Ear: External ear normal.     Nose: Nose normal.     Mouth/Throat:     Mouth: Mucous membranes are moist.     Pharynx: No posterior oropharyngeal erythema.  Eyes:     General: No scleral icterus.       Right eye: No discharge.        Left eye: No discharge.     Extraocular Movements: Extraocular movements intact.     Conjunctiva/sclera: Conjunctivae normal.     Pupils: Pupils are equal, round, and reactive to light.  Cardiovascular:     Rate and Rhythm: Normal rate and regular rhythm.     Pulses: Normal pulses.     Heart sounds: Normal heart sounds.  Pulmonary:     Effort: Pulmonary effort is normal.     Breath sounds: Normal breath sounds.  Abdominal:     General: There is no distension.     Palpations: Abdomen is soft.     Tenderness: There is no abdominal tenderness.  Genitourinary:    Comments: Declined PAP screening overdue.  Musculoskeletal:        General: Normal range of motion.     Cervical back: Normal range of motion and neck  supple.  Lymphadenopathy:     Cervical: No cervical adenopathy.  Skin:    General: Skin is warm.     Findings: No erythema or rash.  Neurological:     Mental Status: She is alert and oriented to person, place, and time.  Psychiatric:        Mood and Affect: Mood normal.        Behavior: Behavior normal.        Thought Content: Thought content normal.        Judgment: Judgment normal.      No results found for any visits on 02/21/20.  Assessment & Plan     Insomnia, unspecified type  Hypothyroidism, unspecified type - Plan: TSH  Depression, recurrent (HCC), Chronic  BMI 36.0-36.9,adult  Body mass index (BMI) of 36.0-36.9 in adult  Sleeping well using Trazodone 100 mg nightly PRN, denies any unwanted side effects taking PRN.   Orders Placed This Encounter  Procedures  . TSH   Call [facility] to schedule mammogram screening.   Schedule PAP when ready let us know if prefer gynecology referral.   Keep endocrinology follow up's.  Need colonoscopy records, patient reports is up to date.    Return in about 6 months (around 08/21/2020), or if symptoms worsen or fail to improve, for at any time for any worsening symptoms, Go to Emergency room/ urgent care if worse.     Red Flags discussed. The patient was given clear instructions to go to ER or return to medical center if any red flags develop, symptoms do not improve, worsen or new problems develop. They verbalized understanding.    Marcille Buffy, [place] [phone] (phone) [phone] (fax)  [place]

## 2020-03-08 ENCOUNTER — Other Ambulatory Visit: Payer: Self-pay | Admitting: Adult Health

## 2020-05-13 ENCOUNTER — Telehealth: Payer: Self-pay

## 2020-05-13 ENCOUNTER — Other Ambulatory Visit: Payer: Self-pay | Admitting: Adult Health

## 2020-05-13 MED ORDER — LEVOTHYROXINE SODIUM 88 MCG PO TABS: 88.0000 ug | ORAL_TABLET | Freq: Every day | ORAL | 1 refills | Status: DC

## 2020-05-13 NOTE — Telephone Encounter (Signed)
Meds ordered this encounter  Medications   levothyroxine (SYNTHROID) 88 MCG tablet    Sig: Take 1 tablet (88 mcg total) by mouth daily before breakfast.    Dispense:  90 tablet    Refill:  1  Sent to pharmacy she should have follow up scheduled already.

## 2020-05-13 NOTE — Telephone Encounter (Signed)
[facility] faxed refill request for the following medications:    levothyroxine (SYNTHROID) 88 MCG tablet   Please advise.  

## 2020-05-13 NOTE — Progress Notes (Signed)
Meds ordered this encounter  Medications  . levothyroxine (SYNTHROID) 88 MCG tablet    Sig: Take 1 tablet (88 mcg total) by mouth daily before breakfast.    Dispense:  90 tablet    Refill:  1  sent to pharmacy per refill request.

## 2020-05-18 ENCOUNTER — Other Ambulatory Visit: Payer: Self-pay | Admitting: Adult Health

## 2020-05-18 NOTE — Telephone Encounter (Signed)
Requested Prescriptions  Pending Prescriptions Disp Refills  . traZODone (DESYREL) 100 MG tablet [Pharmacy Med Name: TRAZODONE 100 MG TABLET] 90 tablet 0    Sig: TAKE 1 TABLET BY MOUTH EVERYDAY AT BEDTIME     Psychiatry: Antidepressants - Serotonin Modulator Failed - 05/18/2020  8:55 AM      Failed - Completed PHQ-2 or PHQ-9 in the last 360 days      Passed - Valid encounter within last 6 months    Recent Outpatient Visits          2 months ago Insomnia, unspecified type   [place], Kelby Aline, FNP   9 months ago Depression, recurrent [facility])   [place] Flinchum, Kelby Aline, FNP   1 year ago Type 2 diabetes mellitus with hyperglycemia, with long-term current use of insulin ([place])   [facility] Flinchum, Kelby Aline, FNP   1 year ago Type 2 diabetes mellitus with hyperglycemia, with long-term current use of insulin ([place])   [place] Flinchum, Kelby Aline, FNP      Future Appointments            In 3 months Flinchum, Kelby Aline, [place], [place]

## 2020-06-04 ENCOUNTER — Other Ambulatory Visit: Payer: Self-pay | Admitting: Adult Health

## 2020-06-04 DIAGNOSIS — Z1231 Encounter for screening mammogram for malignant neoplasm of breast: Secondary | ICD-10-CM

## 2020-06-28 ENCOUNTER — Other Ambulatory Visit: Payer: Self-pay

## 2020-06-28 ENCOUNTER — Ambulatory Visit
Admission: RE | Admit: 2020-06-28 | Discharge: 2020-06-28 | Disposition: A | Discharge status: Home / Self Care | Payer: Managed Care, Other (non HMO) | Source: Ambulatory Visit | Attending: Adult Health | Admitting: Adult Health

## 2020-06-28 DIAGNOSIS — Z1231 Encounter for screening mammogram for malignant neoplasm of breast: Secondary | ICD-10-CM

## 2020-07-03 ENCOUNTER — Encounter: Payer: Self-pay | Admitting: Internal Medicine

## 2020-07-03 ENCOUNTER — Ambulatory Visit: Payer: Managed Care, Other (non HMO) | Admitting: Internal Medicine

## 2020-07-03 ENCOUNTER — Other Ambulatory Visit: Payer: Self-pay

## 2020-07-03 VITALS — BP 140/92 | HR 78 | Ht 72.0 in | Wt 254.2 lb

## 2020-07-03 DIAGNOSIS — E1165 Type 2 diabetes mellitus with hyperglycemia: Secondary | ICD-10-CM

## 2020-07-03 DIAGNOSIS — Z794 Long term (current) use of insulin: Secondary | ICD-10-CM

## 2020-07-03 LAB — POCT GLYCOSYLATED HEMOGLOBIN (HGB A1C): Hemoglobin A1C: 12.5 % — AB (ref 4.0–5.6)

## 2020-07-03 LAB — BASIC METABOLIC PANEL
BUN: 15 mg/dL (ref 6–23)
CO2: 29 mEq/L (ref 19–32)
Calcium: 9.3 mg/dL (ref 8.4–10.5)
Chloride: 100 mEq/L (ref 96–112)
Creatinine, Ser: 0.63 mg/dL (ref 0.40–1.20)
GFR: 101.23 mL/min (ref >60.00)
Glucose, Bld: 231 mg/dL — ABNORMAL HIGH (ref 70–99)
Potassium: 4.6 mEq/L (ref 3.5–5.1)
Sodium: 137 mEq/L (ref 135–145)

## 2020-07-03 LAB — MICROALBUMIN / CREATININE URINE RATIO
Creatinine,U: 251.8 mg/dL
Microalb Creat Ratio: 3.4 mg/g (ref 0.0–30.0)
Microalb, Ur: 8.4 mg/dL — ABNORMAL HIGH (ref 0.0–1.9)

## 2020-07-03 LAB — POCT GLUCOSE (DEVICE FOR HOME USE): POC Glucose: 254 mg/dl — AB (ref 70–99)

## 2020-07-03 MED ORDER — DEXCOM G6 SENSOR MISC: 1.0000 | 3 refills | Status: DC

## 2020-07-03 MED ORDER — BASAGLAR KWIKPEN 100 UNIT/ML ~~LOC~~ SOPN: 50.0000 [IU] | PEN_INJECTOR | Freq: Every day | SUBCUTANEOUS | 1 refills | Status: DC

## 2020-07-03 MED ORDER — METFORMIN HCL 1000 MG PO TABS: 1000.0000 mg | ORAL_TABLET | Freq: Two times a day (BID) | ORAL | 1 refills | Status: DC

## 2020-07-03 MED ORDER — DEXCOM G6 TRANSMITTER MISC: 1.0000 | 3 refills | Status: DC

## 2020-07-03 MED ORDER — OZEMPIC (1 MG/DOSE) 4 MG/3ML ~~LOC~~ SOPN: 1.0000 mg | PEN_INJECTOR | SUBCUTANEOUS | 1 refills | Status: DC

## 2020-07-03 MED ORDER — DAPAGLIFLOZIN PROPANEDIOL 5 MG PO TABS: 5.0000 mg | ORAL_TABLET | Freq: Every day | ORAL | 6 refills | Status: DC

## 2020-07-03 MED ORDER — HUMALOG KWIKPEN 200 UNIT/ML ~~LOC~~ SOPN: 14.0000 [IU] | PEN_INJECTOR | Freq: Three times a day (TID) | SUBCUTANEOUS | 4 refills | Status: DC

## 2020-07-03 MED ORDER — INSULIN PEN NEEDLE 31G X 5 MM MISC: 1.0000 | Freq: Four times a day (QID) | 1 refills | Status: DC

## 2020-07-03 NOTE — Progress Notes (Signed)
Name: Valerie Schmitt  Age/ Sex: 55 y.o., female   MRN/ DOB: [id number], 02-Jul-1965     PCP: Valerie Beam, FNP   Reason for Endocrinology Evaluation: Type 2 Diabetes Mellitus  Initial Endocrine Consultative Visit: 04/26/2019    PATIENT IDENTIFIER: [name] is a 55 y.o. female with a past medical history of T2DM and Dyslipidemia. The patient has followed with Endocrinology clinic since 04/26/2019 for consultative assistance with management of her diabetes.  DIABETIC HISTORY:  Valerie Schmitt was diagnosed with T2DM in 2016. She has been on Glimepiride in the past without reported intolerance.She moved from [place] 06/2018 and records were not available.  Her hemoglobin A1c was 12.9% on initial visit.  On her initial visit to our clinic, her A1c was 12.9%. She was already on MDI regimen , metformin and Ozempic which were continued.   SUBJECTIVE:   During the last visit (07/26/2019): A1c 10.4% . We increased Ozempic, continued Metformin and adjusted  MDI regimen     Today (07/03/2020): Valerie Schmitt is here for a follow up on diabetes management.  She checks her blood sugars occasionally preprandial . The patient has not  had hypoglycemic episodes since the last clinic visit.  Denies nausea or diarrhea    HOME DIABETES REGIMEN:  Metformin 1000 mg BID Ozempic 1 mg weekly  Basaglar 50 units daily- taking 25 units BID  Humalog  10 units TIDQAC- takes mostly twice a day as she skips breakfast      Statin: yes ACE-I/ARB: no    METER DOWNLOAD SUMMARY: Did not bring   DIABETIC COMPLICATIONS: Microvascular complications:    Denies: CKD, neuropathy, retinopathy   Last Eye Exam: Completed 04/2020  Macrovascular complications:   Denies: CAD, CVA, PVD   HISTORY:  Past Medical History:  Past Medical History:  Diagnosis Date  . Allergy   . Anxiety   . Arthritis   . Asthma   . Depression   . Diabetes mellitus without complication ([place])   .  Ketoacidosis 03/2016  . Sleep apnea   . Thyroid disease    Past Surgical History:  Past Surgical History:  Procedure Laterality Date  . BREAST SURGERY    . HERNIA REPAIR    . REDUCTION MAMMAPLASTY Bilateral 2003  . TUBAL LIGATION      Social History:  reports that she has never smoked. She has never used smokeless tobacco. She reports previous alcohol use. She reports that she does not use drugs. Family History:  Family History  Problem Relation Age of Onset  . Diabetes Father   . Scoliosis Daughter   . Autoimmune disease Daughter   . Sleep apnea Daughter   . Anxiety disorder Daughter   . Depression Daughter   . Personality disorder Daughter   . Osteoporosis Maternal Grandmother   . Alzheimer's disease Paternal Grandmother   . Liver disease Paternal Grandfather      HOME MEDICATIONS: Allergies as of 07/03/2020   No Known Allergies     Medication List       Accurate as of July 03, 2020 10:09 AM. If you have any questions, ask your nurse or doctor.        atorvastatin 80 MG tablet Commonly known as: LIPITOR Take 80 mg by mouth every evening.   Basaglar KwikPen 100 UNIT/ML Inject 0.5 mLs (50 Units total) into the skin daily.   DULoxetine 60 MG capsule Commonly known as: CYMBALTA TAKE 1 CAPSULE (60 MG TOTAL) BY MOUTH 2 (TWO) TIMES  DAILY.   icosapent Ethyl 1 g capsule Commonly known as: VASCEPA Take 2 g by mouth 2 (two) times daily.   insulin lispro 100 UNIT/ML KwikPen Commonly known as: HumaLOG KwikPen Inject 0.1 mLs (10 Units total) into the skin 3 (three) times daily.   Insulin Pen Needle 31G X 5 MM Misc 1 Device by Does not apply route in the morning, at noon, in the evening, and at bedtime.   levothyroxine 88 MCG tablet Commonly known as: SYNTHROID Take 1 tablet (88 mcg total) by mouth daily before breakfast.   metFORMIN 1000 MG tablet Commonly known as: GLUCOPHAGE Take 1 tablet (1,000 mg total) by mouth 2 (two) times daily with a meal.    OneTouch UltraLink w/Device Kit by Does not apply route 3 (three) times daily.   Ozempic (1 MG/DOSE) 4 MG/3ML Sopn Generic drug: Semaglutide (1 MG/DOSE) Inject 1 mg into the skin once a week.   traZODone 100 MG tablet Commonly known as: DESYREL TAKE 1 TABLET BY MOUTH EVERYDAY AT BEDTIME        OBJECTIVE:   Vital Signs: BP (!) 140/92   Pulse 78   Ht 6' (1.829 m)   Wt 254 lb 4 oz (115.3 kg)   SpO2 97%   BMI 34.48 kg/m   Wt Readings from Last 3 Encounters:  07/03/20 254 lb 4 oz (115.3 kg)  02/21/20 270 lb 12.8 oz (122.8 kg)  07/26/19 282 lb 12.8 oz (128.3 kg)     Exam: General: Pt appears well and is in NAD  Lungs: Clear with good BS bilat with no rales, rhonchi, or wheezes  Heart: RRR with normal S1 and S2 and no gallops; no murmurs; no rub  Abdomen: Normoactive bowel sounds, soft, nontender, without masses or organomegaly palpable  Extremities: No pretibial edema.   Neuro: MS is good with appropriate affect, pt is alert and Ox3      DM foot exam: 04/25/2019  The skin of the feet is shows 2 superficial cuts at the plantar surface of the left 2nd and right 2 toes.  The pedal pulses are 2+ on right and 2+ on left. The sensation is intact to a screening 5.07, 10 gram monofilament bilaterally    DATA REVIEWED:  Lab Results  Component Value Date   HGBA1C 12.5 (A) 07/03/2020   HGBA1C 10.4 (A) 07/26/2019   HGBA1C 12.9 (H) 04/20/2019   Results for Valerie Schmitt (MRN [id number]) as of 07/04/2020 11:23  Ref. Range 07/03/2020 10:34  Sodium Latest Ref Range: 135 - 145 mEq/L 137  Potassium Latest Ref Range: 3.5 - 5.1 mEq/L 4.6  Chloride Latest Ref Range: 96 - 112 mEq/L 100  CO2 Latest Ref Range: 19 - 32 mEq/L 29  Glucose Latest Ref Range: 70 - 99 mg/dL 231 (H)  BUN Latest Ref Range: 6 - 23 mg/dL 15  Creatinine Latest Ref Range: 0.40 - 1.20 mg/dL 0.63  Calcium Latest Ref Range: 8.4 - 10.5 mg/dL 9.3  GFR Latest Ref Range: >60.00 mL/min 101.23  MICROALB/CREAT  RATIO Latest Ref Range: 0.0 - 30.0 mg/g 3.4  Creatinine,U Latest Units: mg/dL 251.8  Microalb, Ur Latest Ref Range: 0.0 - 1.9 mg/dL 8.4 (H)   ASSESSMENT / PLAN / RECOMMENDATIONS:   1) Type 2 Diabetes Mellitus, Poorly controlled, Without complications - Most recent A1c of 12.5 %. Goal A1c < 7.0 %.     - Has not been here in 11 months.  A1c up from 10.4% to 12.5% , discussed the importance of  keeping up with appointment and with glucose checks.  - I have again asked her to take basglar once daily rather then dividing the dose to BID  - I will increase prandial insulin and provider her with a correction scale   - She is in agreement in starting farxiga, cautioned against genital infections - Dexcom sent  - BMP normal except for hyperglycemia   MEDICATIONS: - Continue Metformin 1000 mg TWice a day  - Continue Ozempic to 1 mg weekly  - Continue  Basaglar  50 units ONCE a day  - Increase Humalog  to 14units with each meal  - Start Farxiga 5 mg daily  -Correction scale : Humalog (BG-130/20)   EDUCATION / INSTRUCTIONS:  BG monitoring instructions: Patient is instructed to check her blood sugars 3 times a day, before each meal .  Call [facility] [facility] if: BG persistently < 70 or > 300. . I reviewed the Rule of 15 for the treatment of hypoglycemia in detail with the patient. Literature supplied.     2) Diabetic complications:   Eye: Does not have known diabetic retinopathy.   Neuro/ Feet: Does not have known diabetic peripheral neuropathy .   Renal: Patient does not have known baseline CKD. She   is not on an ACEI/ARB at present.     F/U in 4 months    Signed electronically by: Mack Guise, MD  [facility]  [facility] [address] [place], [place] [address] Phone: [phone] FAX: [phone]   CC: Valerie Schmitt, [address] [place] [address] Phone: [phone]  Fax:  [phone]  Return to Endocrinology clinic as below: Future Appointments  Date Time Provider Hall  07/03/2020 10:10 AM Lecil Tapp, Melanie Crazier, MD [facility] None  08/05/2020  1:20 PM Tania Ade [facility]  08/21/2020  8:40 AM Flinchum, Kelby Aline, FNP [facility]

## 2020-07-03 NOTE — Patient Instructions (Addendum)
-   Continue Metformin 1000 mg TWice a day  - Continue  Ozempic 1 mg weekly  - Continue  Basaglar 50 units ONCE a day  - Increase Humalog  to 14 units with each meal  - Start Farxiga 5 mg daily  - Humalog correctional insulin: ADD extra units on insulin to your meal-time Humalog dose if your blood sugars are higher than 150. Use the scale below to help guide you:   Blood sugar before meal Number of units to inject  Less than 150 0 unit  151 -  170 1 units  171 -  190 2 units  191 -  210 3 units  211 -  230 4 units  231 -  250 5 units  251 -  270 6 units  271 -  290 7 units  291 -  310 8 units  311 - 330 9 units          HOW TO TREAT LOW BLOOD SUGARS (Blood sugar LESS THAN 70 MG/DL)  Please follow the RULE OF 15 for the treatment of hypoglycemia treatment (when your (blood sugars are less than 70 mg/dL)    STEP 1: Take 15 grams of carbohydrates when your blood sugar is low, which includes:   3-4 GLUCOSE TABS  OR  3-4 OZ OF JUICE OR REGULAR SODA OR  ONE TUBE OF GLUCOSE GEL     STEP 2: RECHECK blood sugar in 15 MINUTES STEP 3: If your blood sugar is still low at the 15 minute recheck --> then, go back to STEP 1 and treat AGAIN with another 15 grams of carbohydrates.

## 2020-07-09 NOTE — Progress Notes (Signed)
Seeing Simona Huh for follow up. Please review since provider no longer at [facility].    IMPRESSION: No mammographic evidence of malignancy. A result letter of this screening mammogram will be mailed directly to the patient.  RECOMMENDATION: Screening mammogram in one year. (Code:SM-B-01Y)  BI-RADS CATEGORY  1: Negative.   Electronically Signed   By: Margarette Canada M.D.   On: 07/09/2020 13:57

## 2020-08-05 ENCOUNTER — Ambulatory Visit (INDEPENDENT_AMBULATORY_CARE_PROVIDER_SITE_OTHER): Payer: Managed Care, Other (non HMO) | Admitting: Family Medicine

## 2020-08-05 ENCOUNTER — Other Ambulatory Visit: Payer: Self-pay

## 2020-08-05 ENCOUNTER — Encounter: Payer: Self-pay | Admitting: Family Medicine

## 2020-08-05 VITALS — BP 122/76 | HR 86 | Temp 98.2°F | Wt 258.0 lb

## 2020-08-05 DIAGNOSIS — E039 Hypothyroidism, unspecified: Secondary | ICD-10-CM | POA: Diagnosis not present

## 2020-08-05 DIAGNOSIS — E782 Mixed hyperlipidemia: Secondary | ICD-10-CM | POA: Diagnosis not present

## 2020-08-05 DIAGNOSIS — I83893 Varicose veins of bilateral lower extremities with other complications: Secondary | ICD-10-CM

## 2020-08-05 DIAGNOSIS — Z794 Long term (current) use of insulin: Secondary | ICD-10-CM

## 2020-08-05 DIAGNOSIS — E1165 Type 2 diabetes mellitus with hyperglycemia: Secondary | ICD-10-CM | POA: Diagnosis not present

## 2020-08-05 DIAGNOSIS — Z0000 Encounter for general adult medical examination without abnormal findings: Secondary | ICD-10-CM

## 2020-08-05 NOTE — Progress Notes (Signed)
Complete physical exam   Patient: Valerie Schmitt   DOB: [birth year]   55 y.o. Female  MRN: [id number] Visit Date: 08/05/2020  Today's healthcare provider: Vernie Murders, PA-C   No chief complaint on file.  Subjective    Valerie Schmitt is a 55 y.o. female who presents today for a complete physical exam.  She reports consuming a general diet.  She generally feels well. She reports sleeping well. She does not have additional problems to discuss today.   Patient would like referral for varicose veins.    Past Medical History:  Diagnosis Date   Allergy    Anxiety    Arthritis    Asthma    Depression    Diabetes mellitus without complication ([place])    Ketoacidosis 03/2016   Sleep apnea    Thyroid disease    Past Surgical History:  Procedure Laterality Date   BREAST SURGERY     HERNIA REPAIR     REDUCTION MAMMAPLASTY Bilateral 2003   TUBAL LIGATION     Social History   Socioeconomic History   Marital status: Divorced    Spouse name: Not on file   Number of children: Not on file   Years of education: Not on file   Highest education level: Not on file  Occupational History   Not on file  Tobacco Use   Smoking status: Never Smoker   Smokeless tobacco: Never Used  Substance and Sexual Activity   Alcohol use: Not Currently   Drug use: Never   Sexual activity: Not Currently  Other Topics Concern   Not on file  Social History Narrative   Not on file   Social Determinants of Health   Financial Resource Strain: Not on file  Food Insecurity: Not on file  Transportation Needs: Not on file  Physical Activity: Not on file  Stress: Not on file  Social Connections: Not on file  Intimate Partner Violence: Not on file   Family Status  Relation Name Status   Father  Deceased   Daughter  (Not Specified)   MGM  (Not Specified)   PGM  (Not Specified)   PGF  (Not Specified)   Family History  Problem Relation Age of Onset   Diabetes Father     Scoliosis Daughter    Autoimmune disease Daughter    Sleep apnea Daughter    Anxiety disorder Daughter    Depression Daughter    Personality disorder Daughter    Osteoporosis Maternal Grandmother    Alzheimer's disease Paternal Grandmother    Liver disease Paternal Grandfather    No Known Allergies   Patient Care Team: Flinchum, Kelby Aline, FNP as PCP - General (Family Medicine)   Medications: Outpatient Medications Prior to Visit  Medication Sig   Blood Glucose Monitoring Suppl (ONETOUCH ULTRALINK) w/Device KIT by Does not apply route 3 (three) times daily.   Continuous Blood Gluc Sensor (DEXCOM G6 SENSOR) MISC 1 Device by Does not apply route as directed.   Continuous Blood Gluc Transmit (DEXCOM G6 TRANSMITTER) MISC 1 Device by Does not apply route as directed.   dapagliflozin propanediol (FARXIGA) 5 MG TABS tablet Take 1 tablet (5 mg total) by mouth daily before breakfast.   DULoxetine (CYMBALTA) 60 MG capsule TAKE 1 CAPSULE (60 MG TOTAL) BY MOUTH 2 (TWO) TIMES DAILY.   Insulin Glargine (BASAGLAR KWIKPEN) 100 UNIT/ML Inject 50 Units into the skin daily.   insulin lispro (HUMALOG KWIKPEN) 200 UNIT/ML KwikPen Inject 14 Units into  the skin with breakfast, with lunch, and with evening meal. Max daily 75 units with correction scale   Insulin Pen Needle 31G X 5 MM MISC 1 Device by Does not apply route in the morning, at noon, in the evening, and at bedtime.   levothyroxine (SYNTHROID) 88 MCG tablet Take 1 tablet (88 mcg total) by mouth daily before breakfast.   metFORMIN (GLUCOPHAGE) 1000 MG tablet Take 1 tablet (1,000 mg total) by mouth 2 (two) times daily with a meal.   Semaglutide, 1 MG/DOSE, (OZEMPIC, 1 MG/DOSE,) 4 MG/3ML SOPN Inject 1 mg into the skin once a week.   traZODone (DESYREL) 100 MG tablet TAKE 1 TABLET BY MOUTH EVERYDAY AT BEDTIME   atorvastatin (LIPITOR) 80 MG tablet Take 80 mg by mouth every evening.   icosapent Ethyl (VASCEPA) 1 g capsule Take 2 g by mouth 2 (two) times  daily.   No facility-administered medications prior to visit.    Review of Systems  Constitutional: Negative.   HENT: Negative.   Eyes: Negative.   Respiratory: Negative.   Cardiovascular: Negative.        Varicose veins  Gastrointestinal: Negative.   Endocrine: Negative.   Genitourinary: Negative.   Musculoskeletal: Negative.   Skin: Positive for rash (elbows and fingersvaricose veins).  Allergic/Immunologic: Negative.   Neurological: Positive for headaches.  Hematological: Negative.   Psychiatric/Behavioral: Negative.     Last CBC Lab Results  Component Value Date   WBC 6.9 07/19/2019   HGB 15.3 07/19/2019   HCT 47.2 (H) 07/19/2019   MCV 91 07/19/2019   MCH 29.4 07/19/2019   RDW 12.4 07/19/2019   PLT 245 16/60/6301   Last metabolic panel Lab Results  Component Value Date   GLUCOSE 231 (H) 07/03/2020   NA 137 07/03/2020   K 4.6 07/03/2020   CL 100 07/03/2020   CO2 29 07/03/2020   BUN 15 07/03/2020   CREATININE 0.63 07/03/2020   GFRNONAA 101 07/19/2019   GFRAA 117 07/19/2019   CALCIUM 9.3 07/03/2020   PROT 6.6 07/19/2019   ALBUMIN 4.4 07/19/2019   LABGLOB 2.2 07/19/2019   AGRATIO 2.0 07/19/2019   BILITOT 0.7 07/19/2019   ALKPHOS 80 07/19/2019   AST 14 07/19/2019   ALT 17 07/19/2019   Last lipids Lab Results  Component Value Date   CHOL 194 07/19/2019   HDL 39 (L) 07/19/2019   LDLCALC 122 (H) 07/19/2019   LDLDIRECT 133 (H) 04/20/2019   TRIG 184 (H) 07/19/2019   CHOLHDL 8.5 (H) 04/20/2019   Last hemoglobin A1c Lab Results  Component Value Date   HGBA1C 12.5 (A) 07/03/2020   Last thyroid functions Lab Results  Component Value Date   TSH 2.810 04/20/2019   Last vitamin D No results found for: 25OHVITD2, 25OHVITD3, VD25OH    Objective    BP 122/76 (BP Location: Right Arm, Patient Position: Sitting, Cuff Size: Normal)   Pulse 86   Temp 98.2 F (36.8 C) (Oral)   Wt 258 lb (117 kg)   SpO2 100%   BMI 34.99 kg/m  BP Readings from Last 3  Encounters:  08/05/20 122/76  07/03/20 (!) 140/92  02/21/20 119/75   Wt Readings from Last 3 Encounters:  08/05/20 258 lb (117 kg)  07/03/20 254 lb 4 oz (115.3 kg)  02/21/20 270 lb 12.8 oz (122.8 kg)      Physical Exam Constitutional:      Appearance: Normal appearance. She is normal weight.  HENT:     Head: Normocephalic and atraumatic.  Right Ear: Tympanic membrane, ear canal and external ear normal.     Left Ear: Tympanic membrane, ear canal and external ear normal.     Nose: Nose normal.     Mouth/Throat:     Mouth: Mucous membranes are moist.     Pharynx: Oropharynx is clear.  Eyes:     Extraocular Movements: Extraocular movements intact.     Conjunctiva/sclera: Conjunctivae normal.     Pupils: Pupils are equal, round, and reactive to light.  Cardiovascular:     Rate and Rhythm: Normal rate and regular rhythm.     Pulses: Normal pulses.     Heart sounds: Normal heart sounds.  Pulmonary:     Effort: Pulmonary effort is normal.     Breath sounds: Normal breath sounds.  Abdominal:     General: Abdomen is flat. Bowel sounds are normal.     Palpations: Abdomen is soft.  Musculoskeletal:        General: Normal range of motion.     Cervical back: Normal range of motion and neck supple.     Comments: Many large varicose veins both legs above thighs.  Skin:    General: Skin is warm and dry.  Neurological:     General: No focal deficit present.     Mental Status: She is alert and oriented to person, place, and time. Mental status is at baseline.  Psychiatric:        Mood and Affect: Mood normal.        Behavior: Behavior normal.        Thought Content: Thought content normal.        Judgment: Judgment normal.      Last depression screening scores PHQ 2/9 Scores 08/05/2020 04/20/2019  PHQ - 2 Score 0 6  PHQ- 9 Score - 18   Last fall risk screening Fall Risk  08/05/2020  Falls in the past year? 0  Number falls in past yr: 0  Injury with Fall? 0   Last Audit-C  alcohol use screening Alcohol Use Disorder Test (AUDIT) 08/05/2020  1. How often do you have a drink containing alcohol? 1  2. How many drinks containing alcohol do you have on a typical day when you are drinking? 0  3. How often do you have six or more drinks on one occasion? 0  AUDIT-C Score 1   A score of 3 or more in women, and 4 or more in men indicates increased risk for alcohol abuse, EXCEPT if all of the points are from question 1   No results found for any visits on 08/05/20.  Assessment & Plan    Routine Health Maintenance and Physical Exam  Exercise Activities and Dietary recommendations Goals   Walk on a treadmill 2-3 miles 3-4 times a week. Continue diabetic diet.      There is no immunization history on file for this patient.  Health Maintenance  Topic Date Due   OPHTHALMOLOGY EXAM  Never done   PAP SMEAR-Modifier  Never done   COLONOSCOPY (Pts 45-71yr Insurance coverage will need to be confirmed)  Never done   FOOT EXAM  04/24/2020   PNEUMOCOCCAL POLYSACCHARIDE VACCINE AGE 63-64 HIGH RISK  02/20/2021 (Originally 01/07/1969)   TETANUS/TDAP  02/20/2021 (Originally 01/07/1986)   Hepatitis C Screening  02/20/2021 (Originally 01/07/1985)   HIV Screening  02/20/2021 (Originally 01/07/1982)   COVID-19 Vaccine (2 - Pfizer 3-dose series) 08/13/2020   INFLUENZA VACCINE  10/14/2020   HEMOGLOBIN A1C  01/02/2021  URINE MICROALBUMIN  07/03/2021   MAMMOGRAM  06/29/2022   HPV VACCINES  Aged Out    Discussed health benefits of physical activity, and encouraged her to engage in regular exercise appropriate for her age and condition.  .1. Routine health maintenance General health stable. Counseled regarding health maintenance.  2. Type 2 diabetes mellitus with hyperglycemia, with long-term current use of insulin (HCC) Simple foot exam normal today without neuropathy. Presently on Basaglar 50 units daily, Metformin 1000 mg  BID, Ozempic 1 mg qd week, Farxiga 5 mg with  breakfast and monitoring glucose by Dexcom. Followed by endocrinologist (Dr. Kelton Pillar- 07-03-20 with Hgb A1C 12.5%). Recheck lipids. - Lipid panel  3. Hypothyroidism, unspecified type Continues Levothyroxine 88 mcg qd. No heat or cold intolerance. Recheck thyroid function tests. - TSH - T4  4. Elevated triglycerides with high cholesterol Tolerating the Atorvastatin 80 mg qd without side effects. Recheck lipid panel. - Lipid panel  5. Varicose veins of both legs with edema - Ambulatory referral to Vascular Surgery   No follow-ups on file.     I, Mareon Robinette, PA-C, have reviewed all documentation for this visit. The documentation on 08/05/20 for the exam, diagnosis, procedures, and orders are all accurate and complete.    Vernie Murders, PA-C  [employer] [phone] (phone) [phone] (fax)  [place]

## 2020-08-06 LAB — LIPID PANEL
Chol/HDL Ratio: 6 ratio — ABNORMAL HIGH (ref 0.0–4.4)
Cholesterol, Total: 298 mg/dL — ABNORMAL HIGH (ref 100–199)
HDL: 50 mg/dL (ref >39)
LDL Chol Calc (NIH): 177 mg/dL — ABNORMAL HIGH (ref 0–99)
Triglycerides: 364 mg/dL — ABNORMAL HIGH (ref 0–149)
VLDL Cholesterol Cal: 71 mg/dL — ABNORMAL HIGH (ref 5–40)

## 2020-08-06 LAB — TSH: TSH: 1.03 u[IU]/mL (ref 0.450–4.500)

## 2020-08-06 LAB — T4: T4, Total: 9.1 ug/dL (ref 4.5–12.0)

## 2020-08-08 ENCOUNTER — Telehealth: Payer: Self-pay

## 2020-08-08 MED ORDER — ATORVASTATIN CALCIUM 80 MG PO TABS: 80.0000 mg | ORAL_TABLET | Freq: Every evening | ORAL | 1 refills | Status: DC

## 2020-08-08 NOTE — Telephone Encounter (Signed)
Pt advised. Refill for Atorvastatin sent to [facility] in [place].   Thanks,   -Mickel Baas

## 2020-08-17 ENCOUNTER — Other Ambulatory Visit: Payer: Self-pay | Admitting: Adult Health

## 2020-08-21 ENCOUNTER — Encounter (INDEPENDENT_AMBULATORY_CARE_PROVIDER_SITE_OTHER): Payer: Managed Care, Other (non HMO)

## 2020-08-21 ENCOUNTER — Encounter (INDEPENDENT_AMBULATORY_CARE_PROVIDER_SITE_OTHER): Payer: Managed Care, Other (non HMO) | Admitting: Nurse Practitioner

## 2020-08-21 ENCOUNTER — Ambulatory Visit: Payer: Managed Care, Other (non HMO) | Admitting: Adult Health

## 2020-08-27 ENCOUNTER — Other Ambulatory Visit (INDEPENDENT_AMBULATORY_CARE_PROVIDER_SITE_OTHER): Payer: Self-pay | Admitting: Nurse Practitioner

## 2020-08-27 DIAGNOSIS — I83893 Varicose veins of bilateral lower extremities with other complications: Secondary | ICD-10-CM

## 2020-08-28 ENCOUNTER — Other Ambulatory Visit: Payer: Self-pay

## 2020-08-28 ENCOUNTER — Ambulatory Visit (INDEPENDENT_AMBULATORY_CARE_PROVIDER_SITE_OTHER): Payer: Managed Care, Other (non HMO) | Admitting: Nurse Practitioner

## 2020-08-28 ENCOUNTER — Ambulatory Visit (INDEPENDENT_AMBULATORY_CARE_PROVIDER_SITE_OTHER): Payer: Managed Care, Other (non HMO)

## 2020-08-28 ENCOUNTER — Encounter (INDEPENDENT_AMBULATORY_CARE_PROVIDER_SITE_OTHER): Payer: Self-pay | Admitting: Nurse Practitioner

## 2020-08-28 VITALS — BP 122/84 | HR 84 | Resp 16 | Wt 254.6 lb

## 2020-08-28 DIAGNOSIS — I83813 Varicose veins of bilateral lower extremities with pain: Secondary | ICD-10-CM

## 2020-08-28 DIAGNOSIS — Z794 Long term (current) use of insulin: Secondary | ICD-10-CM | POA: Diagnosis not present

## 2020-08-28 DIAGNOSIS — E1165 Type 2 diabetes mellitus with hyperglycemia: Secondary | ICD-10-CM

## 2020-08-28 DIAGNOSIS — I83893 Varicose veins of bilateral lower extremities with other complications: Secondary | ICD-10-CM | POA: Diagnosis not present

## 2020-08-28 NOTE — Progress Notes (Signed)
Subjective:    Patient ID: Valerie Schmitt, female    DOB: June 23, 1965, 55 y.o.   MRN: [id number] Chief Complaint  Patient presents with   New Patient (Initial Visit)    Ref [place] varicose veins both legs w/edema,pain in vv to apply pressure    The patient is seen for evaluation of symptomatic varicose veins. The patient relates burning and stinging which worsened steadily throughout the course of the day, particularly with standing. The patient also notes an aching and throbbing pain over the varicosities, particularly with prolonged dependent positions. The symptoms are significantly improved with elevation.  The patient also notes that during hot weather the symptoms are greatly intensified. The patient states the pain from the varicose veins interferes with work, daily exercise, shopping and household maintenance. At this point, the symptoms are persistent and severe enough that they're having a negative impact on lifestyle and are interfering with daily activities.  There is no history of DVT, PE or superficial thrombophlebitis. There is no history of ulceration or hemorrhage. The patient denies a significant family history of varicose veins.  The patient has worn graduated compression for the last three months. At the present time the patient has not been using over-the-counter analgesics. There is no history of prior surgical intervention or sclerotherapy.  Today noninvasive studies show no evidence of DVT or superficial thrombophlebitis.  No evidence of deep venous insufficiency seen bilaterally.  The right lower extremity has superficial venous reflux in the great saphenous vein at the distal thigh extending to the proximal calf.  Left lower extremity does not have reflux in the superficial venous system however the patient does have what seems to be an accessory saphenous vein which does exhibit reflux.  This vein however is very tortuous.   Review of Systems   Cardiovascular:  Positive for leg swelling.  All other systems reviewed and are negative.     Objective:   Physical Exam Vitals reviewed.  HENT:     Head: Normocephalic.  Cardiovascular:     Rate and Rhythm: Normal rate.     Pulses: Normal pulses.  Musculoskeletal:        General: Tenderness present.  Skin:    General: Skin is warm and dry.     Comments: Scattered spider varicosities bilaterally.  Large tortuous varicosity in left medial thigh  Neurological:     Mental Status: She is alert and oriented to person, place, and time.  Psychiatric:        Mood and Affect: Mood normal.        Behavior: Behavior normal.        Thought Content: Thought content normal.        Judgment: Judgment normal.    BP 122/84 (BP Location: Right Arm)   Pulse 84   Resp 16   Wt 254 lb 9.6 oz (115.5 kg)   BMI 34.53 kg/m   Past Medical History:  Diagnosis Date   Allergy    Anxiety    Arthritis    Asthma    Depression    Diabetes mellitus without complication ([place])    Ketoacidosis 03/2016   Sleep apnea    Thyroid disease     Social History   Socioeconomic History   Marital status: Divorced    Spouse name: Not on file   Number of children: Not on file   Years of education: Not on file   Highest education level: Not on file  Occupational History   Not on  file  Tobacco Use   Smoking status: Never   Smokeless tobacco: Never  Substance and Sexual Activity   Alcohol use: Not Currently   Drug use: Never   Sexual activity: Not Currently  Other Topics Concern   Not on file  Social History Narrative   Not on file   Social Determinants of Health   Financial Resource Strain: Not on file  Food Insecurity: Not on file  Transportation Needs: Not on file  Physical Activity: Not on file  Stress: Not on file  Social Connections: Not on file  Intimate Partner Violence: Not on file    Past Surgical History:  Procedure Laterality Date   BREAST SURGERY     HERNIA REPAIR      REDUCTION MAMMAPLASTY Bilateral 2003   TUBAL LIGATION      Family History  Problem Relation Age of Onset   Diabetes Father    Scoliosis Daughter    Autoimmune disease Daughter    Sleep apnea Daughter    Anxiety disorder Daughter    Depression Daughter    Personality disorder Daughter    Osteoporosis Maternal Grandmother    Alzheimer's disease Paternal Grandmother    Liver disease Paternal Grandfather     No Known Allergies  CBC Latest Ref Rng & Units 07/19/2019 04/20/2019  WBC 3.4 - 10.8 x10E3/uL 6.9 6.2  Hemoglobin 11.1 - 15.9 g/dL 15.3 17.0(H)  Hematocrit 34.0 - 46.6 % 47.2(H) 48.6(H)  Platelets 150 - 450 x10E3/uL 245 255      CMP     Component Value Date/Time   NA 137 07/03/2020 1034   NA 138 07/19/2019 0951   K 4.6 07/03/2020 1034   CL 100 07/03/2020 1034   CO2 29 07/03/2020 1034   GLUCOSE 231 (H) 07/03/2020 1034   BUN 15 07/03/2020 1034   BUN 12 07/19/2019 0951   CREATININE 0.63 07/03/2020 1034   CALCIUM 9.3 07/03/2020 1034   PROT 6.6 07/19/2019 0951   ALBUMIN 4.4 07/19/2019 0951   AST 14 07/19/2019 0951   ALT 17 07/19/2019 0951   ALKPHOS 80 07/19/2019 0951   BILITOT 0.7 07/19/2019 0951   GFRNONAA 101 07/19/2019 0951   GFRAA 117 07/19/2019 0951     No results found.     Assessment & Plan:   1. Varicose veins of both lower extremities with pain Recommend  I have reviewed my previous  discussion with the patient regarding  varicose veins and why they cause symptoms. Patient will continue  wearing graduated compression stockings class 1 on a daily basis, beginning first thing in the morning and removing them in the evening.    In addition, behavioral modification including elevation during the day was again discussed and this will continue.  The patient has utilized over the counter pain medications and has been exercising.  However, at this time conservative therapy has not alleviated the patient's symptoms of leg pain and swelling  Recommend: laser  ablation of the right great saphenous veins to eliminate the symptoms of pain and swelling of the lower extremities caused by the severe superficial venous reflux disease.    2. Type 2 diabetes mellitus with hyperglycemia, with long-term current use of insulin (HCC) Continue hypoglycemic medications as already ordered, these medications have been reviewed and there are no changes at this time.  Hgb A1C to be monitored as already arranged by primary service    Current Outpatient Medications on File Prior to Visit  Medication Sig Dispense Refill   atorvastatin (LIPITOR)  80 MG tablet Take 1 tablet (80 mg total) by mouth every evening. 90 tablet 1   Blood Glucose Monitoring Suppl (ONETOUCH ULTRALINK) w/Device KIT by Does not apply route 3 (three) times daily.     Continuous Blood Gluc Sensor (DEXCOM G6 SENSOR) MISC 1 Device by Does not apply route as directed. 9 each 3   Continuous Blood Gluc Transmit (DEXCOM G6 TRANSMITTER) MISC 1 Device by Does not apply route as directed. 1 each 3   dapagliflozin propanediol (FARXIGA) 5 MG TABS tablet Take 1 tablet (5 mg total) by mouth daily before breakfast. 30 tablet 6   DULoxetine (CYMBALTA) 60 MG capsule TAKE 1 CAPSULE (60 MG TOTAL) BY MOUTH 2 (TWO) TIMES DAILY. 180 capsule 1   icosapent Ethyl (VASCEPA) 1 g capsule Take 2 g by mouth 2 (two) times daily.     Insulin Glargine (BASAGLAR KWIKPEN) 100 UNIT/ML Inject 50 Units into the skin daily. 45 mL 1   insulin lispro (HUMALOG KWIKPEN) 200 UNIT/ML KwikPen Inject 14 Units into the skin with breakfast, with lunch, and with evening meal. Max daily 75 units with correction scale 30 mL 4   Insulin Pen Needle 31G X 5 MM MISC 1 Device by Does not apply route in the morning, at noon, in the evening, and at bedtime. 400 each 1   levothyroxine (SYNTHROID) 88 MCG tablet Take 1 tablet (88 mcg total) by mouth daily before breakfast. 90 tablet 1   metFORMIN (GLUCOPHAGE) 1000 MG tablet Take 1 tablet (1,000 mg total) by mouth  2 (two) times daily with a meal. 180 tablet 1   Semaglutide, 1 MG/DOSE, (OZEMPIC, 1 MG/DOSE,) 4 MG/3ML SOPN Inject 1 mg into the skin once a week. 9 mL 1   traZODone (DESYREL) 100 MG tablet TAKE 1 TABLET BY MOUTH EVERYDAY AT BEDTIME 90 tablet 0   No current facility-administered medications on file prior to visit.    There are no Patient Instructions on file for this visit. No follow-ups on file.   Kris Hartmann, NP

## 2020-09-08 ENCOUNTER — Other Ambulatory Visit: Payer: Self-pay | Admitting: Adult Health

## 2020-09-26 ENCOUNTER — Telehealth: Payer: Self-pay

## 2020-09-26 NOTE — Telephone Encounter (Signed)
Copied from [place] [phone]. Topic: General - Other >> Sep 26, 2020  2:10 PM Leward Quan A wrote: Reason for CRM: Patient called in to inform Vernie Murders that the paper work she left on 08/05/20 is needed back so that  she can turn it in to her employer. Say that she need it right away and if it can be uploaded to her My Chart. Please advise and inform patient at Ph# [phone]

## 2020-10-08 NOTE — Telephone Encounter (Signed)
Can not find any paper work she may have left for [place]. Can she get [place] another copy?

## 2020-10-17 ENCOUNTER — Encounter (INDEPENDENT_AMBULATORY_CARE_PROVIDER_SITE_OTHER): Payer: Self-pay

## 2020-10-17 ENCOUNTER — Encounter: Payer: Self-pay | Admitting: Family Medicine

## 2020-10-17 NOTE — Telephone Encounter (Signed)
Form completed. Advised patient. Copied for records and placed up front for pick up.

## 2020-10-17 NOTE — Telephone Encounter (Signed)
Can you help with retrieving this form? I still don't see it.

## 2020-10-17 NOTE — Telephone Encounter (Signed)
Pt called in to follow up on paperwork, advised of the note below. Pt says that the provider completed a portion of form at her last apt. Pt says that she will upload another copy of form in her mychart to provide it to provider. Pt would like to be called when ready for pick up. Pt says that form has to be turned in by 8/19.  [id number] - please assist pt further.

## 2020-10-31 ENCOUNTER — Encounter: Payer: Self-pay | Admitting: Internal Medicine

## 2020-11-04 ENCOUNTER — Other Ambulatory Visit: Payer: Self-pay

## 2020-11-04 MED ORDER — DEXCOM G6 SENSOR MISC: 1.0000 | 3 refills | Status: DC

## 2020-11-06 ENCOUNTER — Ambulatory Visit: Payer: Managed Care, Other (non HMO) | Admitting: Internal Medicine

## 2020-11-20 DIAGNOSIS — I83819 Varicose veins of unspecified lower extremities with pain: Secondary | ICD-10-CM | POA: Insufficient documentation

## 2020-11-20 NOTE — Progress Notes (Signed)
    MRN : [id number]  Valerie Schmitt is a 55 y.o. (07-Sep-1965) female who presents with chief complaint of No chief complaint on file. .    The patient's right lower extremity was sterilely prepped and draped.  The ultrasound machine was used to visualize the right great saphenous vein throughout its course.  A segment below the knee was selected for access.  The saphenous vein was accessed without difficulty using ultrasound guidance with a micropuncture needle.   An 0.018  wire was placed beyond the saphenofemoral junction through the sheath and the microneedle was removed.  The 65 cm sheath was then placed over the wire and the wire and dilator were removed.  The laser fiber was placed through the sheath and its tip was placed approximately 2 cm below the saphenofemoral junction.  Tumescent anesthesia was then created with a dilute lidocaine solution.  Laser energy was then delivered with constant withdrawal of the sheath and laser fiber.  Approximately 2154 Joules of energy were delivered over a length of 46 cm.  Sterile dressings were placed.  The patient tolerated the procedure well without complications.

## 2020-11-21 ENCOUNTER — Encounter (INDEPENDENT_AMBULATORY_CARE_PROVIDER_SITE_OTHER): Payer: Self-pay | Admitting: Vascular Surgery

## 2020-11-21 ENCOUNTER — Other Ambulatory Visit: Payer: Self-pay

## 2020-11-21 ENCOUNTER — Ambulatory Visit (INDEPENDENT_AMBULATORY_CARE_PROVIDER_SITE_OTHER): Payer: Managed Care, Other (non HMO) | Admitting: Vascular Surgery

## 2020-11-21 DIAGNOSIS — I83819 Varicose veins of unspecified lower extremities with pain: Secondary | ICD-10-CM

## 2020-11-21 DIAGNOSIS — I83811 Varicose veins of right lower extremities with pain: Secondary | ICD-10-CM | POA: Diagnosis not present

## 2020-11-24 ENCOUNTER — Encounter (INDEPENDENT_AMBULATORY_CARE_PROVIDER_SITE_OTHER): Payer: Self-pay | Admitting: Vascular Surgery

## 2020-11-25 ENCOUNTER — Other Ambulatory Visit (INDEPENDENT_AMBULATORY_CARE_PROVIDER_SITE_OTHER): Payer: Self-pay | Admitting: Vascular Surgery

## 2020-11-25 DIAGNOSIS — I83811 Varicose veins of right lower extremities with pain: Secondary | ICD-10-CM

## 2020-11-27 ENCOUNTER — Ambulatory Visit (INDEPENDENT_AMBULATORY_CARE_PROVIDER_SITE_OTHER): Payer: Managed Care, Other (non HMO)

## 2020-11-27 ENCOUNTER — Other Ambulatory Visit: Payer: Self-pay

## 2020-11-27 DIAGNOSIS — I83811 Varicose veins of right lower extremities with pain: Secondary | ICD-10-CM

## 2020-12-03 ENCOUNTER — Other Ambulatory Visit: Payer: Self-pay | Admitting: Family Medicine

## 2020-12-03 NOTE — Telephone Encounter (Signed)
Requested medications are due for refill today yes  Requested medications are on the active medication list yes  Last refill 09/05/20  Last visit 02/2020 addressed med/dx  Future visit scheduled no  Notes to clinic Failed protocol due to no valid visit within 6 months, no upcoming visit scheduled.

## 2020-12-18 ENCOUNTER — Other Ambulatory Visit: Payer: Self-pay

## 2020-12-18 ENCOUNTER — Telehealth: Payer: Self-pay | Admitting: Family Medicine

## 2020-12-18 MED ORDER — LEVOTHYROXINE SODIUM 88 MCG PO TABS: 88.0000 ug | ORAL_TABLET | Freq: Every day | ORAL | 0 refills | Status: DC

## 2020-12-18 NOTE — Telephone Encounter (Signed)
[facility] faxed refill request for the following medications:  levothyroxine (SYNTHROID) 88 MCG tablet    Please advise.

## 2020-12-19 ENCOUNTER — Ambulatory Visit (INDEPENDENT_AMBULATORY_CARE_PROVIDER_SITE_OTHER): Payer: Managed Care, Other (non HMO) | Admitting: Vascular Surgery

## 2020-12-23 ENCOUNTER — Ambulatory Visit (INDEPENDENT_AMBULATORY_CARE_PROVIDER_SITE_OTHER): Payer: Managed Care, Other (non HMO) | Admitting: Vascular Surgery

## 2020-12-23 NOTE — Progress Notes (Signed)
Name: Valerie Schmitt  Age/ Sex: 55 y.o., female   MRN/ DOB: [id number], 05-28-1965     PCP: Margo Common, PA-C (Inactive)   Reason for Endocrinology Evaluation: Type 2 Diabetes Mellitus  Initial Endocrine Consultative Visit: 04/26/2019    PATIENT IDENTIFIER: Valerie Schmitt is a 55 y.o. female with a past medical history of T2DM and Dyslipidemia. The patient has followed with Endocrinology clinic since 04/26/2019 for consultative assistance with management of her diabetes.  DIABETIC HISTORY:  Valerie Schmitt was diagnosed with T2DM in 2016. She has been on Glimepiride in the past without reported intolerance.She moved from [place] 06/2018 and records were not available.  Her hemoglobin A1c was 12.9% on initial visit.  On her initial visit to our clinic, her A1c was 12.9%. She was already on MDI regimen , metformin and Ozempic which were continued.   SUBJECTIVE:   During the last visit (07/03/2020): A1c 12.5 % . We started farxiga, , adjusted MDI regimen and continued  Metformin and ozempic      Today (12/25/2020): Valerie Schmitt is here for a follow up on diabetes management.  She checks her blood sugars occasionally preprandial . The patient has  had hypoglycemic episodes since the last clinic visit, she is symptomatic with these episodes   Denies nausea, or vomiting, had an episode of diarrhea that she attributes to eating steak  Denies side effects to [place]:  Metformin 1000 mg BID Farxiga 5 mg daily  Ozempic 1 mg weekly  Basaglar 50 units daily Humalog  14 units TIDQAC Correction scale : Humalog (BG-130/20)     Statin: yes ACE-I/ARB: no   METER DOWNLOAD SUMMARY: 9/28-10/02/2021  Average Number Tests/Day = 0.4 Overall Mean FS Glucose = 159 Standard Deviation = 78  BG Ranges: Low = 33 High = 248   Hypoglycemic Events/30 Days: BG < 50 = 1 Episodes of symptomatic severe hypoglycemia = 0   DIABETIC  COMPLICATIONS: Microvascular complications:   Denies: CKD, neuropathy, retinopathy  Last Eye Exam: Completed 04/2020  Macrovascular complications:  Denies: CAD, CVA, PVD   HISTORY:  Past Medical History:  Past Medical History:  Diagnosis Date   Allergy    Anxiety    Arthritis    Asthma    Depression    Diabetes mellitus without complication ([place])    Ketoacidosis 03/2016   Sleep apnea    Thyroid disease    Past Surgical History:  Past Surgical History:  Procedure Laterality Date   BREAST SURGERY     HERNIA REPAIR     REDUCTION MAMMAPLASTY Bilateral 2003   TUBAL LIGATION     Social History:  reports that she has never smoked. She has never used smokeless tobacco. She reports that she does not currently use alcohol. She reports that she does not use drugs. Family History:  Family History  Problem Relation Age of Onset   Diabetes Father    Scoliosis Daughter    Autoimmune disease Daughter    Sleep apnea Daughter    Anxiety disorder Daughter    Depression Daughter    Personality disorder Daughter    Osteoporosis Maternal Grandmother    Alzheimer's disease Paternal Grandmother    Liver disease Paternal Grandfather      HOME MEDICATIONS: Allergies as of 12/25/2020   No Known Allergies      Medication List        Accurate as of December 25, 2020 10:09 AM. If you have any  questions, ask your nurse or doctor.          STOP taking these medications    Ozempic (1 MG/DOSE) 4 MG/3ML Sopn Generic drug: Semaglutide (1 MG/DOSE) Replaced by: Ozempic (2 MG/DOSE) 8 MG/3ML Sopn Stopped by: Dorita Sciara, MD       TAKE these medications    atorvastatin 80 MG tablet Commonly known as: LIPITOR Take 1 tablet (80 mg total) by mouth every evening.   Basaglar KwikPen 100 UNIT/ML Inject 40 Units into the skin daily. What changed: how much to take Changed by: Dorita Sciara, MD   dapagliflozin propanediol 10 MG Tabs tablet Commonly known as:  Farxiga Take 1 tablet (10 mg total) by mouth daily before breakfast. What changed:  medication strength how much to take Changed by: Dorita Sciara, MD   Dexcom G6 Sensor Misc 1 Device by Does not apply route as directed.   Dexcom G6 Transmitter Misc 1 Device by Does not apply route as directed.   DULoxetine 60 MG capsule Commonly known as: CYMBALTA TAKE 1 CAPSULE BY MOUTH 2 TIMES DAILY.   HumaLOG KwikPen 200 UNIT/ML KwikPen Generic drug: insulin lispro Inject 14 Units into the skin with breakfast, with lunch, and with evening meal. Max daily 75 units with correction scale   icosapent Ethyl 1 g capsule Commonly known as: VASCEPA Take 2 g by mouth 2 (two) times daily.   Insulin Pen Needle 31G X 5 MM Misc 1 Device by Does not apply route in the morning, at noon, in the evening, and at bedtime.   levothyroxine 88 MCG tablet Commonly known as: SYNTHROID Take 1 tablet (88 mcg total) by mouth daily before breakfast.   metFORMIN 1000 MG tablet Commonly known as: GLUCOPHAGE Take 1 tablet (1,000 mg total) by mouth 2 (two) times daily with a meal.   OneTouch UltraLink w/Device Kit by Does not apply route 3 (three) times daily.   Ozempic (2 MG/DOSE) 8 MG/3ML Sopn Generic drug: Semaglutide (2 MG/DOSE) Inject 2 mg into the skin once a week. Replaces: Ozempic (1 MG/DOSE) 4 MG/3ML Sopn Started by: Dorita Sciara, MD   traZODone 100 MG tablet Commonly known as: DESYREL Take 1 tablet (100 mg total) by mouth at bedtime. Please schedule an office visit before anymore refills.         OBJECTIVE:   Vital Signs: BP 102/72 (BP Location: Left Arm, Patient Position: Sitting, Cuff Size: Small)   Pulse 94   Ht 6' (1.829 m)   Wt 245 lb (111.1 kg)   SpO2 97%   BMI 33.23 kg/m   Wt Readings from Last 3 Encounters:  12/25/20 245 lb (111.1 kg)  11/21/20 251 lb (113.9 kg)  08/28/20 254 lb 9.6 oz (115.5 kg)     Exam: General: Pt appears well and is in NAD  Lungs:  Clear with good BS bilat with no rales, rhonchi, or wheezes  Heart: RRR with normal S1 and S2 and no gallops; no murmurs; no rub  Abdomen: Normoactive bowel sounds, soft, nontender, without masses or organomegaly palpable  Extremities: No pretibial edema.   Neuro: MS is good with appropriate affect, pt is alert and Ox3      DM foot exam: 12/25/2020   The skin of the feet is shows 2 superficial cuts at the plantar surface of the left 2nd and right 2 toes.  The pedal pulses are 2+ on right and 2+ on left. The sensation is intact to a screening 5.07, 10 gram  monofilament bilaterally    DATA REVIEWED:  Lab Results  Component Value Date   HGBA1C 10.8 (A) 12/25/2020   HGBA1C 12.5 (A) 07/03/2020   HGBA1C 10.4 (A) 07/26/2019   Results for Valerie, Schmitt (MRN [id number]) as of 12/26/2020 09:34  Ref. Range 12/25/2020 10:14  Sodium Latest Ref Range: 135 - 145 mEq/L 137  Potassium Latest Ref Range: 3.5 - 5.1 mEq/L 4.3  Chloride Latest Ref Range: 96 - 112 mEq/L 99  CO2 Latest Ref Range: 19 - 32 mEq/L 28  Glucose Latest Ref Range: 70 - 99 mg/dL 187 (H)  BUN Latest Ref Range: 6 - 23 mg/dL 13  Creatinine Latest Ref Range: 0.40 - 1.20 mg/dL 0.76  Calcium Latest Ref Range: 8.4 - 10.5 mg/dL 9.9  GFR Latest Ref Range: >60.00 mL/min 89.12    ASSESSMENT / PLAN / RECOMMENDATIONS:   1) Type 2 Diabetes Mellitus, Poorly controlled, Without complications - Most recent A1c of 10.8%. Goal A1c < 7.0 %.     - A1c trending down  but she understands its still high and the goal is 7.0%  - Dexcom received , but has not received her training yet  - She checks once a day, hence loses opportunity on using correction scale  - Will reduce basaglar due to an episode of hypoglycemia at 33 mg/dL, will increase farxiga and Ozempic  - BMP is normal     MEDICATIONS: - Continue Metformin 1000 mg TWice a day  - Increase  Ozempic to 2 mg weekly  - Decrease Basaglar  40 units ONCE a day  - Continue Humalog   14units with each meal  - Increase Farxiga to 10 mg daily  -Correction scale : Humalog (BG-130/20)   EDUCATION / INSTRUCTIONS: BG monitoring instructions: Patient is instructed to check her blood sugars 3 times a day, before each meal . Call [facility] if: BG persistently < 70  I reviewed the Rule of 15 for the treatment of hypoglycemia in detail with the patient. Literature supplied.     2) Diabetic complications:  Eye: Does not have known diabetic retinopathy.  Neuro/ Feet: Does not have known diabetic peripheral neuropathy .  Renal: Patient does not have known baseline CKD. She   is not on an ACEI/ARB at present.   3) Dyslipidemia :  - LDL and Tg elevated despite being on high dose of atorvastatin. Admits to non-compliance  - Discused cardiovascular benefits of statins , encouraged compliance, will check on next visit     Continue Atorvastatin 80 mg daily  Continue Vascepa 2 grams BID      F/U in 4 months    Signed electronically by: Mack Guise, MD  [facility]  [address] [place], [place] [address] Phone: [phone] FAX: [phone]   CC: Margo Common, PA-C (Inactive) No address on file Phone: None  Fax: None  Return to Endocrinology clinic as below: Future Appointments  Date Time Provider [place]  12/26/2020  2:30 PM Schnier, Dolores Lory, MD [facility] None

## 2020-12-25 ENCOUNTER — Ambulatory Visit: Payer: Managed Care, Other (non HMO) | Admitting: Internal Medicine

## 2020-12-25 ENCOUNTER — Other Ambulatory Visit: Payer: Self-pay

## 2020-12-25 ENCOUNTER — Encounter: Payer: Self-pay | Admitting: Internal Medicine

## 2020-12-25 VITALS — BP 102/72 | HR 94 | Ht 72.0 in | Wt 245.0 lb

## 2020-12-25 DIAGNOSIS — E1165 Type 2 diabetes mellitus with hyperglycemia: Secondary | ICD-10-CM | POA: Diagnosis not present

## 2020-12-25 DIAGNOSIS — E782 Mixed hyperlipidemia: Secondary | ICD-10-CM | POA: Diagnosis not present

## 2020-12-25 DIAGNOSIS — Z794 Long term (current) use of insulin: Secondary | ICD-10-CM

## 2020-12-25 LAB — BASIC METABOLIC PANEL
BUN: 13 mg/dL (ref 6–23)
CO2: 28 mEq/L (ref 19–32)
Calcium: 9.9 mg/dL (ref 8.4–10.5)
Chloride: 99 mEq/L (ref 96–112)
Creatinine, Ser: 0.76 mg/dL (ref 0.40–1.20)
GFR: 89.12 mL/min (ref >60.00)
Glucose, Bld: 187 mg/dL — ABNORMAL HIGH (ref 70–99)
Potassium: 4.3 mEq/L (ref 3.5–5.1)
Sodium: 137 mEq/L (ref 135–145)

## 2020-12-25 LAB — POCT GLYCOSYLATED HEMOGLOBIN (HGB A1C): Hemoglobin A1C: 10.8 % — AB (ref 4.0–5.6)

## 2020-12-25 MED ORDER — OZEMPIC (2 MG/DOSE) 8 MG/3ML ~~LOC~~ SOPN: 2.0000 mg | PEN_INJECTOR | SUBCUTANEOUS | 1 refills | Status: DC

## 2020-12-25 MED ORDER — DAPAGLIFLOZIN PROPANEDIOL 10 MG PO TABS: 10.0000 mg | ORAL_TABLET | Freq: Every day | ORAL | 2 refills | Status: DC

## 2020-12-25 MED ORDER — BASAGLAR KWIKPEN 100 UNIT/ML ~~LOC~~ SOPN: 40.0000 [IU] | PEN_INJECTOR | Freq: Every day | SUBCUTANEOUS | 1 refills | Status: DC

## 2020-12-25 NOTE — Patient Instructions (Signed)
-   Increase  Ozempic 2 mg weekly  - Increase Farxiga 10 mg daily  - Decrease Basaglar to 40 units daily  - Continue Metformin 1000 mg BID  - Continue Humalog 14 units with each meal   - Humalog correctional insulin: ADD extra units on insulin to your meal-time Humalog dose if your blood sugars are higher than 150. Use the scale below to help guide you:   Blood sugar before meal Number of units to inject  Less than 150 0 unit  151 -  170 1 units  171 -  190 2 units  191 -  210 3 units  211 -  230 4 units  231 -  250 5 units  251 -  270 6 units  271 -  290 7 units  291 -  310 8 units  311 - 330 9 units         HOW TO TREAT LOW BLOOD SUGARS (Blood sugar LESS THAN 70 MG/DL) Please follow the RULE OF 15 for the treatment of hypoglycemia treatment (when your (blood sugars are less than 70 mg/dL)   STEP 1: Take 15 grams of carbohydrates when your blood sugar is low, which includes:  3-4 GLUCOSE TABS  OR 3-4 OZ OF JUICE OR REGULAR SODA OR ONE TUBE OF GLUCOSE GEL    STEP 2: RECHECK blood sugar in 15 MINUTES STEP 3: If your blood sugar is still low at the 15 minute recheck --> then, go back to STEP 1 and treat AGAIN with another 15 grams of carbohydrates.

## 2020-12-26 ENCOUNTER — Encounter (INDEPENDENT_AMBULATORY_CARE_PROVIDER_SITE_OTHER): Payer: Self-pay | Admitting: Vascular Surgery

## 2020-12-26 ENCOUNTER — Ambulatory Visit (INDEPENDENT_AMBULATORY_CARE_PROVIDER_SITE_OTHER): Payer: Managed Care, Other (non HMO) | Admitting: Vascular Surgery

## 2020-12-26 VITALS — BP 113/76 | HR 93 | Ht 72.0 in | Wt 246.0 lb

## 2020-12-26 DIAGNOSIS — E039 Hypothyroidism, unspecified: Secondary | ICD-10-CM

## 2020-12-26 DIAGNOSIS — Z794 Long term (current) use of insulin: Secondary | ICD-10-CM | POA: Diagnosis not present

## 2020-12-26 DIAGNOSIS — I83819 Varicose veins of unspecified lower extremities with pain: Secondary | ICD-10-CM | POA: Diagnosis not present

## 2020-12-26 DIAGNOSIS — E1165 Type 2 diabetes mellitus with hyperglycemia: Secondary | ICD-10-CM

## 2020-12-31 ENCOUNTER — Encounter (INDEPENDENT_AMBULATORY_CARE_PROVIDER_SITE_OTHER): Payer: Self-pay | Admitting: Vascular Surgery

## 2020-12-31 NOTE — Progress Notes (Signed)
MRN : [id number]  Fredrick Dray is a 55 y.o. (1965/11/19) female who presents with chief complaint of painful varicose veins.  History of Present Illness:   The patient returns to the office for followup status post laser ablation of the right great saphenous vein on 11/21/2020.  The patient note significant improvement in the lower extremity pain but not resolution of the symptoms. The patient notes multiple residual varicosities bilaterally which continued to hurt with dependent positions and remained tender to palpation. The patient's swelling is minimally from preoperative status. The patient continues to wear graduated compression stockings on a daily basis but these are not eliminating the pain and discomfort. The patient continues to use over-the-counter anti-inflammatory medications to treat the pain and related symptoms but this has not given the patient relief. The patient notes the pain in the lower extremities is causing problems with daily exercise, problems at work and even with household activities such as preparing meals and doing dishes.  The patient is otherwise done well and there have been no complications related to the laser procedure or interval changes in the patient's overall   Post laser ultrasound shows successful ablation of the right great saphenous vein    Current Meds  Medication Sig   atorvastatin (LIPITOR) 80 MG tablet Take 1 tablet (80 mg total) by mouth every evening.   Blood Glucose Monitoring Suppl (ONETOUCH ULTRALINK) w/Device KIT by Does not apply route 3 (three) times daily.   Continuous Blood Gluc Sensor (DEXCOM G6 SENSOR) MISC 1 Device by Does not apply route as directed.   Continuous Blood Gluc Transmit (DEXCOM G6 TRANSMITTER) MISC 1 Device by Does not apply route as directed.   dapagliflozin propanediol (FARXIGA) 10 MG TABS tablet Take 1 tablet (10 mg total) by mouth daily before breakfast.   DULoxetine (CYMBALTA) 60 MG capsule TAKE 1 CAPSULE  BY MOUTH 2 TIMES DAILY.   icosapent Ethyl (VASCEPA) 1 g capsule Take 2 g by mouth 2 (two) times daily.   Insulin Glargine (BASAGLAR KWIKPEN) 100 UNIT/ML Inject 40 Units into the skin daily.   insulin lispro (HUMALOG KWIKPEN) 200 UNIT/ML KwikPen Inject 14 Units into the skin with breakfast, with lunch, and with evening meal. Max daily 75 units with correction scale   Insulin Pen Needle 31G X 5 MM MISC 1 Device by Does not apply route in the morning, at noon, in the evening, and at bedtime.   levothyroxine (SYNTHROID) 88 MCG tablet Take 1 tablet (88 mcg total) by mouth daily before breakfast.   metFORMIN (GLUCOPHAGE) 1000 MG tablet Take 1 tablet (1,000 mg total) by mouth 2 (two) times daily with a meal.   Semaglutide, 2 MG/DOSE, (OZEMPIC, 2 MG/DOSE,) 8 MG/3ML SOPN Inject 2 mg into the skin once a week.   traZODone (DESYREL) 100 MG tablet Take 1 tablet (100 mg total) by mouth at bedtime. Please schedule an office visit before anymore refills.    Past Medical History:  Diagnosis Date   Allergy    Anxiety    Arthritis    Asthma    Depression    Diabetes mellitus without complication ([place])    Ketoacidosis 03/2016   Sleep apnea    Thyroid disease     Past Surgical History:  Procedure Laterality Date   BREAST SURGERY     HERNIA REPAIR     REDUCTION MAMMAPLASTY Bilateral 2003   TUBAL LIGATION      Social History Social History   Tobacco Use   Smoking  status: Never   Smokeless tobacco: Never  Substance Use Topics   Alcohol use: Not Currently   Drug use: Never    Family History Family History  Problem Relation Age of Onset   Diabetes Father    Scoliosis Daughter    Autoimmune disease Daughter    Sleep apnea Daughter    Anxiety disorder Daughter    Depression Daughter    Personality disorder Daughter    Osteoporosis Maternal Grandmother    Alzheimer's disease Paternal Grandmother    Liver disease Paternal Grandfather     No Known Allergies   REVIEW OF SYSTEMS  (Negative unless checked)  Constitutional: _0 Weight loss  _1 Fever  _2 Chills Cardiac: _3 Chest pain   _4 Chest pressure   _5 Palpitations   _6 Shortness of breath when laying flat   _7 Shortness of breath with exertion. Vascular:  _8 Pain in legs with walking   _9 Pain in legs at rest  _10 History of DVT   _11 Phlebitis   _12 Swelling in legs   _13 Varicose veins   _14 Non-healing ulcers Pulmonary:   _15 Uses home oxygen   _16 Productive cough   _17 Hemoptysis   _18 Wheeze  _19 COPD   _20 Asthma Neurologic:  _21 Dizziness   _22 Seizures   _23 History of stroke   _24 History of TIA  _25 Aphasia   _26 Vissual changes   _27 Weakness or numbness in arm   _28 Weakness or numbness in leg Musculoskeletal:   _29 Joint swelling   _30 Joint pain   _31 Low back pain Hematologic:  _32 Easy bruising  _33 Easy bleeding   _34 Hypercoagulable state   _35 Anemic Gastrointestinal:  _36 Diarrhea   _37 Vomiting  _38 Gastroesophageal reflux/heartburn   _39 Difficulty swallowing. Genitourinary:  _40 Chronic kidney disease   _41 Difficult urination  _42 Frequent urination   _43 Blood in urine Skin:  _44 Rashes   _45 Ulcers  Psychological:  _46 History of anxiety   _47  History of major depression.  Physical Examination  Vitals:   12/26/20 1452  BP: 113/76  Pulse: 93  Weight: 246 lb (111.6 kg)  Height: 6' (1.829 m)   Body mass index is 33.36 kg/m. Gen: WD/WN, NAD Head: [place]/AT, No temporalis wasting.  Ear/Nose/Throat: Hearing grossly intact, nares w/o erythema or drainage, pinna without lesions Eyes: PER, EOMI, sclera nonicteric.  Neck: Supple, no gross masses.  No JVD.  Pulmonary:  Good air movement, no audible wheezing, no use of accessory muscles.  Cardiac: RRR, precordium not hyperdynamic. Vascular:  large varicosities >10 mm present right leg.  Mild venous stasis changes to the legs bilaterally.  2+ soft pitting edema  Vessel Right Left  Radial Palpable Palpable  Gastrointestinal: soft, non-distended. No guarding/no peritoneal signs.  Musculoskeletal: M/S 5/5 throughout.  No  deformity.  Neurologic: CN 2-12 intact. Pain and light touch intact in extremities.  Symmetrical.  Speech is fluent. Motor exam as listed above. Psychiatric: Judgment intact, Mood & affect appropriate for pt's clinical situation. Dermatologic: Venous rashes no ulcers noted.  No changes consistent with cellulitis. Lymph : No lichenification or skin changes of chronic lymphedema.  CBC Lab Results  Component Value Date   WBC 6.9 07/19/2019   HGB 15.3 07/19/2019   HCT 47.2 (H) 07/19/2019   MCV 91 07/19/2019   PLT 245 07/19/2019    BMET    Component Value Date/Time   NA 137 12/25/2020 1014   NA 138 07/19/2019 0951   K 4.3 12/25/2020 1014   CL 99 12/25/2020 1014   CO2 28 12/25/2020 1014   GLUCOSE 187 (H) 12/25/2020 1014   BUN 13 12/25/2020 1014   BUN 12 07/19/2019 0951   CREATININE 0.76  12/25/2020 1014   CALCIUM 9.9 12/25/2020 1014   GFRNONAA 101 07/19/2019 0951   GFRAA 117 07/19/2019 0951   Estimated Creatinine Clearance: 113.6 mL/min (by C-G formula based on SCr of 0.76 mg/dL).  COAG No results found for: INR, PROTIME  Radiology No results found.   Assessment/Plan 1. Varicose veins with pain Recommend:  The patient has had successful ablation of the previously incompetent saphenous venous system but still has persistent symptoms of pain and swelling that are having a negative impact on daily life and daily activities.  Patient should undergo injection sclerotherapy to treat the residual varicosities.  The risks, benefits and alternative therapies were reviewed in detail with the patient.  All questions were answered.  The patient agrees to proceed with sclerotherapy at their convenience.  The patient will continue wearing the graduated compression stockings and using the over-the-counter pain medications to treat her symptoms.       2. Type 2 diabetes mellitus with hyperglycemia, with long-term current use of insulin (HCC) Continue hypoglycemic medications as  already ordered, these medications have been reviewed and there are no changes at this time.  Hgb A1C to be monitored as already arranged by primary service   3. Hypothyroidism, unspecified type Continue hormone replacement as ordered and reviewed, no changes at this time     Hortencia Pilar, MD  12/31/2020 2:33 PM

## 2021-02-06 ENCOUNTER — Other Ambulatory Visit: Payer: Self-pay | Admitting: Internal Medicine

## 2021-02-10 ENCOUNTER — Telehealth: Payer: Self-pay | Admitting: Family Medicine

## 2021-02-10 MED ORDER — ATORVASTATIN CALCIUM 80 MG PO TABS: 80.0000 mg | ORAL_TABLET | Freq: Every evening | ORAL | 1 refills | Status: DC

## 2021-02-10 NOTE — Telephone Encounter (Signed)
[facility] faxed refill request for the following medications:  atorvastatin (LIPITOR) 80 MG tablet   Please advise.

## 2021-02-10 NOTE — Addendum Note (Signed)
Addended by: Ashley Royalty E on: 02/10/2021 10:47 AM   Modules accepted: Orders

## 2021-03-05 ENCOUNTER — Other Ambulatory Visit: Payer: Self-pay | Admitting: Family Medicine

## 2021-03-05 NOTE — Telephone Encounter (Signed)
Courtesy refill. Patient needs office visit for further refills. Requested Prescriptions  Pending Prescriptions Disp Refills   traZODone (DESYREL) 100 MG tablet [Pharmacy Med Name: TRAZODONE 100 MG TABLET] 30 tablet 0    Sig: TAKE 1 TABLET BY MOUTH AT BEDTIME. PLEASE SCHEDULE AN OFFICE VISIT BEFORE ANYMORE REFILLS.     Psychiatry: Antidepressants - Serotonin Modulator Failed - 03/05/2021  1:08 AM      Failed - Valid encounter within last 6 months    Recent Outpatient Visits          7 months ago Routine health maintenance   [place], PA-C   1 year ago Insomnia, unspecified type   [facility], Kelby Aline, [place]   1 year ago Depression, recurrent [facility])   [place] Flinchum, Kelby Aline, FNP   1 year ago Type 2 diabetes mellitus with hyperglycemia, with long-term current use of insulin ([place])   [facility] Flinchum, Kelby Aline, FNP   1 year ago Type 2 diabetes mellitus with hyperglycemia, with long-term current use of insulin [facility])   [place], FNP             Passed - Completed PHQ-2 or PHQ-9 in the last 360 days

## 2021-03-21 ENCOUNTER — Other Ambulatory Visit: Payer: Self-pay | Admitting: Physician Assistant

## 2021-03-21 ENCOUNTER — Other Ambulatory Visit: Payer: Self-pay | Admitting: Family Medicine

## 2021-03-21 ENCOUNTER — Telehealth: Payer: Self-pay

## 2021-03-21 DIAGNOSIS — F339 Major depressive disorder, recurrent, unspecified: Secondary | ICD-10-CM

## 2021-03-21 MED ORDER — DULOXETINE HCL 60 MG PO CPEP: 60.0000 mg | ORAL_CAPSULE | Freq: Two times a day (BID) | ORAL | 0 refills | Status: DC

## 2021-03-21 NOTE — Telephone Encounter (Signed)
LOV:08/05/2020 LR: 09/10/2020 Qty:180 R:1 NOV: patient is scheduled at [facility] next month 02/22 with Sharyn Lull Flinchum

## 2021-03-21 NOTE — Telephone Encounter (Signed)
[facility] faxed refill request for the following medications:  DULoxetine (CYMBALTA) 60 MG capsule   Please advise.

## 2021-03-21 NOTE — Progress Notes (Signed)
Refill to get her to next appt w/ new provider

## 2021-03-21 NOTE — Telephone Encounter (Signed)
Requested medication (s) are due for refill today:   Yes  Requested medication (s) are on the active medication list:   Yes  Future visit scheduled:   No   Last ordered: 12/18/2020 #90, 0 refill  Returned because pt has changed providers.   She is set up to see Laverna Peace at [place] in Feb. 2023.   I called her and confirmed she was transferring her care and she is.   She is changing because it's too hard to get appts at [facility].      Requested Prescriptions  Pending Prescriptions Disp Refills   levothyroxine (SYNTHROID) 88 MCG tablet [Pharmacy Med Name: LEVOTHYROXINE 88 MCG TABLET] 90 tablet 0    Sig: TAKE 1 TABLET BY MOUTH DAILY BEFORE BREAKFAST.     Endocrinology:  Hypothyroid Agents Failed - 03/21/2021  1:38 AM      Failed - TSH needs to be rechecked within 3 months after an abnormal result. Refill until TSH is due.      Passed - TSH in normal range and within 360 days    TSH  Date Value Ref Range Status  08/05/2020 1.030 0.450 - 4.500 uIU/mL Final          Passed - Valid encounter within last 12 months    Recent Outpatient Visits           7 months ago Routine health maintenance   [place], PA-C   1 year ago Insomnia, unspecified type   [facility], Kelby Aline, [place]   1 year ago Depression, recurrent [facility])   [place] Flinchum, Kelby Aline, FNP   1 year ago Type 2 diabetes mellitus with hyperglycemia, with long-term current use of insulin ([place])   [facility] Flinchum, Kelby Aline, FNP   1 year ago Type 2 diabetes mellitus with hyperglycemia, with long-term current use of insulin ([place])   [place], Kelby Aline, FNP       Future Appointments             In 1 month Flinchum, Kelby Aline, [place], [place]

## 2021-03-28 ENCOUNTER — Other Ambulatory Visit: Payer: Self-pay | Admitting: Family Medicine

## 2021-03-28 NOTE — Telephone Encounter (Signed)
Requested medication (s) are due for refill today - little early  Requested medication (s) are on the active medication list -yes  Future visit scheduled -no  Last refill: 03/05/21 #30  Notes to clinic: Request RF: last fill courtesy with notes- sent for reveiw  Requested Prescriptions  Pending Prescriptions Disp Refills   traZODone (DESYREL) 100 MG tablet [Pharmacy Med Name: TRAZODONE 100 MG TABLET] 90 tablet 1    Sig: TAKE 1 TABLET BY MOUTH AT BEDTIME. PLEASE SCHEDULE AN OFFICE VISIT BEFORE ANYMORE REFILLS.     Psychiatry: Antidepressants - Serotonin Modulator Failed - 03/28/2021  2:34 PM      Failed - Valid encounter within last 6 months    Recent Outpatient Visits           7 months ago Routine health maintenance   [place], PA-C   1 year ago Insomnia, unspecified type   [facility], Kelby Aline, [place]   1 year ago Depression, recurrent [facility])   [place] Flinchum, Kelby Aline, FNP   1 year ago Type 2 diabetes mellitus with hyperglycemia, with long-term current use of insulin ([place])   [facility] Flinchum, Kelby Aline, FNP   1 year ago Type 2 diabetes mellitus with hyperglycemia, with long-term current use of insulin ([place])   [place], Kelby Aline, FNP       Future Appointments             In 1 month Flinchum, Kelby Aline, [place], [place] - Completed PHQ-2 or PHQ-9 in the last 360 days         Requested Prescriptions  Pending Prescriptions Disp Refills   traZODone (DESYREL) 100 MG tablet [Pharmacy Med Name: TRAZODONE 100 MG TABLET] 90 tablet 1    Sig: TAKE 1 TABLET BY MOUTH AT BEDTIME. PLEASE SCHEDULE AN OFFICE VISIT BEFORE ANYMORE REFILLS.     Psychiatry: Antidepressants - Serotonin Modulator Failed - 03/28/2021  2:34 PM      Failed - Valid encounter within last 6 months    Recent Outpatient Visits            7 months ago Routine health maintenance   [place], PA-C   1 year ago Insomnia, unspecified type   [facility], Kelby Aline, [place]   1 year ago Depression, recurrent [facility])   [place] Flinchum, Kelby Aline, FNP   1 year ago Type 2 diabetes mellitus with hyperglycemia, with long-term current use of insulin ([place])   [facility] Flinchum, Kelby Aline, FNP   1 year ago Type 2 diabetes mellitus with hyperglycemia, with long-term current use of insulin ([place])   [place], Kelby Aline, FNP       Future Appointments             In 1 month Flinchum, Kelby Aline, [place], [place] - Completed PHQ-2 or PHQ-9 in the last 360 days

## 2021-03-31 NOTE — Telephone Encounter (Signed)
Pt has up coming appointment on 04/10/21.

## 2021-04-01 ENCOUNTER — Other Ambulatory Visit: Payer: Self-pay | Admitting: Family Medicine

## 2021-04-02 LAB — HM DIABETES EYE EXAM

## 2021-04-09 NOTE — Progress Notes (Signed)
° ° °  MRN : [id number]  Valerie Schmitt is a 56 y.o. (10/19/1965) female who presents with chief complaint of painful varicose veins.    The patient's left lower extremity was sterilely prepped and draped.  The ultrasound machine was used to visualize the left great saphenous vein throughout its course.  A segment below the knee was selected for access.  The saphenous vein was accessed without difficulty using ultrasound guidance with a micropuncture needle.   An 0.018  wire was placed beyond the saphenofemoral junction through the sheath and the microneedle was removed.  The 65 cm sheath was then placed over the wire and the wire and dilator were removed.  The laser fiber was placed through the sheath and its tip was placed approximately 2 cm below the saphenofemoral junction.  Tumescent anesthesia was then created with a dilute lidocaine solution.  Laser energy was then delivered with constant withdrawal of the sheath and laser fiber.  Approximately 1498 Joules of energy were delivered over a length of 49 cm.  Sterile dressings were placed.  The patient tolerated the procedure well without complications.

## 2021-04-10 ENCOUNTER — Ambulatory Visit (INDEPENDENT_AMBULATORY_CARE_PROVIDER_SITE_OTHER): Payer: Managed Care, Other (non HMO) | Admitting: Vascular Surgery

## 2021-04-10 ENCOUNTER — Other Ambulatory Visit: Payer: Self-pay

## 2021-04-10 ENCOUNTER — Encounter (INDEPENDENT_AMBULATORY_CARE_PROVIDER_SITE_OTHER): Payer: Self-pay | Admitting: Vascular Surgery

## 2021-04-10 VITALS — BP 129/85 | HR 90 | Resp 16 | Wt 240.0 lb

## 2021-04-10 DIAGNOSIS — I83819 Varicose veins of unspecified lower extremities with pain: Secondary | ICD-10-CM

## 2021-04-11 ENCOUNTER — Encounter (INDEPENDENT_AMBULATORY_CARE_PROVIDER_SITE_OTHER): Payer: Self-pay | Admitting: Vascular Surgery

## 2021-04-16 ENCOUNTER — Other Ambulatory Visit (INDEPENDENT_AMBULATORY_CARE_PROVIDER_SITE_OTHER): Payer: Self-pay | Admitting: Vascular Surgery

## 2021-04-16 ENCOUNTER — Ambulatory Visit (INDEPENDENT_AMBULATORY_CARE_PROVIDER_SITE_OTHER): Payer: Managed Care, Other (non HMO)

## 2021-04-16 ENCOUNTER — Other Ambulatory Visit: Payer: Self-pay

## 2021-04-16 DIAGNOSIS — I83813 Varicose veins of bilateral lower extremities with pain: Secondary | ICD-10-CM

## 2021-04-30 ENCOUNTER — Ambulatory Visit: Payer: Managed Care, Other (non HMO) | Admitting: Internal Medicine

## 2021-04-30 ENCOUNTER — Encounter: Payer: Self-pay | Admitting: Internal Medicine

## 2021-04-30 ENCOUNTER — Other Ambulatory Visit: Payer: Self-pay

## 2021-04-30 VITALS — BP 124/70 | HR 88 | Ht 72.0 in | Wt 243.0 lb

## 2021-04-30 DIAGNOSIS — E1165 Type 2 diabetes mellitus with hyperglycemia: Secondary | ICD-10-CM

## 2021-04-30 DIAGNOSIS — Z794 Long term (current) use of insulin: Secondary | ICD-10-CM | POA: Diagnosis not present

## 2021-04-30 LAB — POCT GLYCOSYLATED HEMOGLOBIN (HGB A1C): Hemoglobin A1C: 9.1 % — AB (ref 4.0–5.6)

## 2021-04-30 NOTE — Patient Instructions (Addendum)
-   Continue Ozempic 2 mg weekly  - Continue Farxiga 10 mg daily  - Continue Basaglar  40 units daily  - Continue Metformin 1000 mg BID  - Change  Humalog 12 units with Brunch and 14 with Supper   - Humalog correctional insulin: ADD extra units on insulin to your meal-time Humalog dose if your blood sugars are higher than 150. Use the scale below to help guide you:   Blood sugar before meal Number of units to inject  Less than 150 0 unit  151 -  170 1 units  171 -  190 2 units  191 -  210 3 units  211 -  230 4 units  231 -  250 5 units  251 -  270 6 units  271 -  290 7 units  291 -  310 8 units  311 - 330 9 units         HOW TO TREAT LOW BLOOD SUGARS (Blood sugar LESS THAN 70 MG/DL) Please follow the RULE OF 15 for the treatment of hypoglycemia treatment (when your (blood sugars are less than 70 mg/dL)   STEP 1: Take 15 grams of carbohydrates when your blood sugar is low, which includes:  3-4 GLUCOSE TABS  OR 3-4 OZ OF JUICE OR REGULAR SODA OR ONE TUBE OF GLUCOSE GEL    STEP 2: RECHECK blood sugar in 15 MINUTES STEP 3: If your blood sugar is still low at the 15 minute recheck --> then, go back to STEP 1 and treat AGAIN with another 15 grams of carbohydrates.

## 2021-04-30 NOTE — Progress Notes (Signed)
Name: Valerie Schmitt  Age/ Sex: 56 y.o., female   MRN/ DOB: [id number], [birth year]     PCP: No primary care provider on file.   Reason for Endocrinology Evaluation: Type 2 Diabetes Mellitus  Initial Endocrine Consultative Visit: 04/26/2019    PATIENT IDENTIFIER: Valerie Schmitt is a 56 y.o. female with a past medical history of T2DM and Dyslipidemia. The patient has followed with Endocrinology clinic since 04/26/2019 for consultative assistance with management of her diabetes.  DIABETIC HISTORY:  Ms. Amberg was diagnosed with T2DM in 2016. She has been on Glimepiride in the past without reported intolerance.She moved from [place] 06/2018 and records were not available.  Her hemoglobin A1c was 12.9% on initial visit.  On her initial visit to our clinic, her A1c was 12.9%. She was already on MDI regimen , metformin and Ozempic which were continued.   SUBJECTIVE:   During the last visit (12/25/2020): A1c 10.8 % . We increased Ozempic and farxiga , adjusted MDI regimen and continued  Metformin      Today (04/30/2021): [name] is here for a follow up on diabetes management.  She checks her blood sugars occasionally preprandial . The patient has  had hypoglycemic episodes since the last clinic visit, she is symptomatic with these episodes   Denies nausea, vomiting or diarrhea    HOME DIABETES REGIMEN:  Metformin 1000 mg BID Farxiga 10 mg daily  Ozempic 2 mg weekly  Basaglar 40 units daily Humalog  14 units Twice a day  Correction scale : Humalog (BG-130/20)     Statin: yes ACE-I/ARB: no    CONTINUOUS GLUCOSE MONITORING RECORD INTERPRETATION    Dates of Recording: 2/2-2/15/2023  Sensor description:dexcom  Results statistics:   CGM use % of time 93  Average and SD 154/40  Time in range   81     %  % Time Above 180 15  % Time above 250 3  % Time Below target <1      Glycemic patterns summary: optimal BG's through the night, with occasional  hyperglycemia with meals   Hyperglycemic episodes  postprandial  Hypoglycemic episodes occurred after breakfast (brunch)   Overnight periods: optimal      DIABETIC COMPLICATIONS: Microvascular complications:   Denies: CKD, neuropathy, retinopathy  Last Eye Exam: Completed 04/2020  Macrovascular complications:  Denies: CAD, CVA, PVD   HISTORY:  Past Medical History:  Past Medical History:  Diagnosis Date   Allergy    Anxiety    Arthritis    Asthma    Depression    Diabetes mellitus without complication ([place])    Ketoacidosis 03/2016   Sleep apnea    Thyroid disease    Past Surgical History:  Past Surgical History:  Procedure Laterality Date   BREAST SURGERY     HERNIA REPAIR     REDUCTION MAMMAPLASTY Bilateral 2003   TUBAL LIGATION     Social History:  reports that she has never smoked. She has never used smokeless tobacco. She reports that she does not currently use alcohol. She reports that she does not use drugs. Family History:  Family History  Problem Relation Age of Onset   Diabetes Father    Scoliosis Daughter    Autoimmune disease Daughter    Sleep apnea Daughter    Anxiety disorder Daughter    Depression Daughter    Personality disorder Daughter    Osteoporosis Maternal Grandmother    Alzheimer's disease Paternal Grandmother    Liver disease Paternal  Grandfather      HOME MEDICATIONS: Allergies as of 04/30/2021   No Known Allergies      Medication List        Accurate as of April 30, 2021 10:11 AM. If you have any questions, ask your nurse or doctor.          atorvastatin 80 MG tablet Commonly known as: LIPITOR Take 1 tablet (80 mg total) by mouth every evening.   Basaglar KwikPen 100 UNIT/ML Inject 40 Units into the skin daily.   dapagliflozin propanediol 10 MG Tabs tablet Commonly known as: Farxiga Take 1 tablet (10 mg total) by mouth daily before breakfast.   Dexcom G6 Sensor Misc 1 Device by Does not apply route as  directed.   Dexcom G6 Transmitter Misc 1 Device by Does not apply route as directed.   DULoxetine 60 MG capsule Commonly known as: CYMBALTA Take 1 capsule (60 mg total) by mouth 2 (two) times daily.   HumaLOG KwikPen 200 UNIT/ML KwikPen Generic drug: insulin lispro Inject 14 Units into the skin with breakfast, with lunch, and with evening meal. Max daily 75 units with correction scale   icosapent Ethyl 1 g capsule Commonly known as: VASCEPA Take 2 g by mouth 2 (two) times daily.   Insulin Pen Needle 31G X 5 MM Misc 1 Device by Does not apply route in the morning, at noon, in the evening, and at bedtime.   levothyroxine 88 MCG tablet Commonly known as: SYNTHROID TAKE 1 TABLET BY MOUTH DAILY BEFORE BREAKFAST.   metFORMIN 1000 MG tablet Commonly known as: GLUCOPHAGE TAKE 1 TABLET (1,000 MG TOTAL) BY MOUTH 2 (TWO) TIMES DAILY WITH A MEAL.   OneTouch UltraLink w/Device Kit by Does not apply route 3 (three) times daily.   Ozempic (2 MG/DOSE) 8 MG/3ML Sopn Generic drug: Semaglutide (2 MG/DOSE) Inject 2 mg into the skin once a week.   traZODone 100 MG tablet Commonly known as: DESYREL TAKE 1 TABLET BY MOUTH AT BEDTIME. PLEASE SCHEDULE AN OFFICE VISIT BEFORE ANYMORE REFILLS.         OBJECTIVE:   Vital Signs: BP 124/70 (BP Location: Left Arm, Patient Position: Sitting, Cuff Size: Small)    Pulse 88    Ht 6' (1.829 m)    Wt 243 lb (110.2 kg)    SpO2 98%    BMI 32.96 kg/m   Wt Readings from Last 3 Encounters:  04/30/21 243 lb (110.2 kg)  04/10/21 240 lb (108.9 kg)  12/26/20 246 lb (111.6 kg)     Exam: General: Pt appears well and is in NAD  Lungs: Clear with good BS bilat with no rales, rhonchi, or wheezes  Heart: RRR with normal S1 and S2 and no gallops; no murmurs; no rub  Abdomen: Normoactive bowel sounds, soft, nontender, without masses or organomegaly palpable  Extremities: No pretibial edema.   Neuro: MS is good with appropriate affect, pt is alert and Ox3       DM foot exam: 12/25/2020   The skin of the feet is shows 2 superficial cuts at the plantar surface of the left 2nd and right 2 toes.  The pedal pulses are 2+ on right and 2+ on left. The sensation is intact to a screening 5.07, 10 gram monofilament bilaterally    DATA REVIEWED:  Lab Results  Component Value Date   HGBA1C 9.1 (A) 04/30/2021   HGBA1C 10.8 (A) 12/25/2020   HGBA1C 12.5 (A) 07/03/2020   Results for Warren Danes (MRN  [id number]) as of 12/26/2020 09:34  Ref. Range 12/25/2020 10:14  Sodium Latest Ref Range: 135 - 145 mEq/L 137  Potassium Latest Ref Range: 3.5 - 5.1 mEq/L 4.3  Chloride Latest Ref Range: 96 - 112 mEq/L 99  CO2 Latest Ref Range: 19 - 32 mEq/L 28  Glucose Latest Ref Range: 70 - 99 mg/dL 187 (H)  BUN Latest Ref Range: 6 - 23 mg/dL 13  Creatinine Latest Ref Range: 0.40 - 1.20 mg/dL 0.76  Calcium Latest Ref Range: 8.4 - 10.5 mg/dL 9.9  GFR Latest Ref Range: >60.00 mL/min 89.12    ASSESSMENT / PLAN / RECOMMENDATIONS:   1) Type 2 Diabetes Mellitus, Poorly controlled, Without complications - Most recent A1c of 9.1%. Goal A1c < 7.0 %.     -Slight improvement in her A1c but continues to be above goal -In review of her CGM it appears that her average BG's have trended down to optimal levels, I have encouraged her to continue these readings -She is motivated to improve her lifestyle -I am going to adjust her prandial insulin as she has been noted with hypoglycemia after branch  MEDICATIONS: - Continue Metformin 1000 mg TWice a day  - Continue Ozempic 2 mg weekly  -Continue Basaglar  40 units ONCE a day  -Change Humalog 12 units with brunch and 14 units with supper -Continue Farxiga  10 mg daily  -Correction scale : Humalog (BG-130/20)   EDUCATION / INSTRUCTIONS: BG monitoring instructions: Patient is instructed to check her blood sugars 3 times a day, before each meal . Call [facility] if: BG persistently < 70  I  reviewed the Rule of 15 for the treatment of hypoglycemia in detail with the patient. Literature supplied.     2) Diabetic complications:  Eye: Does not have known diabetic retinopathy.  Neuro/ Feet: Does not have known diabetic peripheral neuropathy .  Renal: Patient does not have known baseline CKD. She   is not on an ACEI/ARB at present.     F/U in 4 months    Signed electronically by: Mack Guise, MD  [facility]  [facility] [address] [place], [place] [address] Phone: [phone] FAX: [phone]   CC: No primary care provider on file. No primary provider on file. Phone: None  Fax: None  Return to Endocrinology clinic as below: Future Appointments  Date Time Provider [place]  05/07/2021  9:00 AM Doreen Beam, FNP [id number]  05/08/2021  4:00 PM Schnier, Dolores Lory, MD [facility] None

## 2021-05-06 ENCOUNTER — Encounter: Payer: Self-pay | Admitting: Adult Health

## 2021-05-07 ENCOUNTER — Ambulatory Visit: Payer: Managed Care, Other (non HMO) | Admitting: Adult Health

## 2021-05-08 ENCOUNTER — Other Ambulatory Visit: Payer: Self-pay

## 2021-05-08 ENCOUNTER — Ambulatory Visit (INDEPENDENT_AMBULATORY_CARE_PROVIDER_SITE_OTHER): Payer: Managed Care, Other (non HMO) | Admitting: Vascular Surgery

## 2021-05-08 VITALS — BP 114/79 | HR 96 | Ht 72.0 in | Wt 242.0 lb

## 2021-05-08 DIAGNOSIS — E782 Mixed hyperlipidemia: Secondary | ICD-10-CM

## 2021-05-08 DIAGNOSIS — I83819 Varicose veins of unspecified lower extremities with pain: Secondary | ICD-10-CM

## 2021-05-08 DIAGNOSIS — E1165 Type 2 diabetes mellitus with hyperglycemia: Secondary | ICD-10-CM | POA: Diagnosis not present

## 2021-05-08 DIAGNOSIS — Z794 Long term (current) use of insulin: Secondary | ICD-10-CM

## 2021-05-08 DIAGNOSIS — E039 Hypothyroidism, unspecified: Secondary | ICD-10-CM | POA: Diagnosis not present

## 2021-05-11 ENCOUNTER — Encounter (INDEPENDENT_AMBULATORY_CARE_PROVIDER_SITE_OTHER): Payer: Self-pay | Admitting: Vascular Surgery

## 2021-05-11 NOTE — Progress Notes (Signed)
MRN : [id number]  Valerie Schmitt is a 56 y.o. (March 30, 1965) female who presents with chief complaint of follow up laser.  History of Present Illness:   The patient returns to the office for followup status post laser ablation of the left great saphenous vein on 04/10/2021.  She is also status post laser ablation of the right great saphenous vein on 11/21/2020.  The patient note significant improvement in the lower extremity pain but not resolution of the symptoms. The patient notes multiple residual varicosities bilaterally which continued to hurt with dependent positions and remained tender to palpation. The patient's swelling is minimally from preoperative status. The patient continues to wear graduated compression stockings on a daily basis but these are not eliminating the pain and discomfort. The patient continues to use over-the-counter anti-inflammatory medications to treat the pain and related symptoms but this has not given the patient relief. The patient notes the pain in the lower extremities is causing problems with daily exercise, problems at work and even with household activities such as preparing meals and doing dishes.  The patient is otherwise done well and there have been no complications related to the laser procedure or interval changes in the patient's overall   Post laser ultrasound shows successful ablation of the great saphenous veins bilaterally.    Current Meds  Medication Sig   atorvastatin (LIPITOR) 80 MG tablet Take 1 tablet (80 mg total) by mouth every evening.   Blood Glucose Monitoring Suppl (ONETOUCH ULTRALINK) w/Device KIT by Does not apply route 3 (three) times daily.   Continuous Blood Gluc Sensor (DEXCOM G6 SENSOR) MISC 1 Device by Does not apply route as directed.   Continuous Blood Gluc Transmit (DEXCOM G6 TRANSMITTER) MISC 1 Device by Does not apply route as directed.   dapagliflozin propanediol (FARXIGA) 10 MG TABS tablet Take 1 tablet (10 mg total)  by mouth daily before breakfast.   DULoxetine (CYMBALTA) 60 MG capsule Take 1 capsule (60 mg total) by mouth 2 (two) times daily.   icosapent Ethyl (VASCEPA) 1 g capsule Take 2 g by mouth 2 (two) times daily.   Insulin Glargine (BASAGLAR KWIKPEN) 100 UNIT/ML Inject 40 Units into the skin daily.   insulin lispro (HUMALOG KWIKPEN) 200 UNIT/ML KwikPen Inject 14 Units into the skin with breakfast, with lunch, and with evening meal. Max daily 75 units with correction scale   Insulin Pen Needle 31G X 5 MM MISC 1 Device by Does not apply route in the morning, at noon, in the evening, and at bedtime.   levothyroxine (SYNTHROID) 88 MCG tablet TAKE 1 TABLET BY MOUTH DAILY BEFORE BREAKFAST.   metFORMIN (GLUCOPHAGE) 1000 MG tablet TAKE 1 TABLET (1,000 MG TOTAL) BY MOUTH 2 (TWO) TIMES DAILY WITH A MEAL.   Semaglutide, 2 MG/DOSE, (OZEMPIC, 2 MG/DOSE,) 8 MG/3ML SOPN Inject 2 mg into the skin once a week.   traZODone (DESYREL) 100 MG tablet TAKE 1 TABLET BY MOUTH AT BEDTIME. PLEASE SCHEDULE AN OFFICE VISIT BEFORE ANYMORE REFILLS.    Past Medical History:  Diagnosis Date   Allergy    Anxiety    Arthritis    Asthma    Depression    Diabetes mellitus without complication ([place])    Ketoacidosis 03/2016   Sleep apnea    Thyroid disease     Past Surgical History:  Procedure Laterality Date   BREAST SURGERY     HERNIA REPAIR     REDUCTION MAMMAPLASTY Bilateral 2003   TUBAL LIGATION  Social History Social History   Tobacco Use   Smoking status: Never   Smokeless tobacco: Never  Substance Use Topics   Alcohol use: Not Currently   Drug use: Never    Family History Family History  Problem Relation Age of Onset   Diabetes Father    Scoliosis Daughter    Autoimmune disease Daughter    Sleep apnea Daughter    Anxiety disorder Daughter    Depression Daughter    Personality disorder Daughter    Osteoporosis Maternal Grandmother    Alzheimer's disease Paternal Grandmother    Liver disease  Paternal Grandfather     No Known Allergies   REVIEW OF SYSTEMS (Negative unless checked)  Constitutional: _0 Weight loss  _1 Fever  _2 Chills Cardiac: _3 Chest pain   _4 Chest pressure   _5 Palpitations   _6 Shortness of breath when laying flat   _7 Shortness of breath with exertion. Vascular:  _8 Pain in legs with walking   _9 Pain in legs at rest  _10 History of DVT   _11 Phlebitis   _12 Swelling in legs   _13 Varicose veins   _14 Non-healing ulcers Pulmonary:   _15 Uses home oxygen   _16 Productive cough   _17 Hemoptysis   _18 Wheeze  _19 COPD   _20 Asthma Neurologic:  _21 Dizziness   _22 Seizures   _23 History of stroke   _24 History of TIA  _25 Aphasia   _26 Vissual changes   _27 Weakness or numbness in arm   _28 Weakness or numbness in leg Musculoskeletal:   _29 Joint swelling   _30 Joint pain   _31 Low back pain Hematologic:  _32 Easy bruising  _33 Easy bleeding   _34 Hypercoagulable state   _35 Anemic Gastrointestinal:  _36 Diarrhea   _37 Vomiting  _38 Gastroesophageal reflux/heartburn   _39 Difficulty swallowing. Genitourinary:  _40 Chronic kidney disease   _41 Difficult urination  _42 Frequent urination   _43 Blood in urine Skin:  _44 Rashes   _45 Ulcers  Psychological:  _46 History of anxiety   _47  History of major depression.  Physical Examination  Vitals:   05/08/21 1619  BP: 114/79  Pulse: 96  Weight: 242 lb (109.8 kg)  Height: 6' (1.829 m)   Body mass index is 32.82 kg/m. Gen: WD/WN, NAD Head: [place]/AT, No temporalis wasting.  Ear/Nose/Throat: Hearing grossly intact, nares w/o erythema or drainage, pinna without lesions Eyes: PER, EOMI, sclera nonicteric.  Neck: Supple, no gross masses.  No JVD.  Pulmonary:  Good air movement, no audible wheezing, no use of accessory muscles.  Cardiac: RRR, precordium not hyperdynamic. Vascular:  Large varicosities present extensively greater than 10 mm bilaterally.  Mild venous stasis changes to the legs bilaterally.  2+ soft pitting edema  Vessel Right Left  Radial Palpable Palpable  Gastrointestinal: soft,  non-distended. No guarding/no peritoneal signs.  Musculoskeletal: M/S 5/5 throughout.  No deformity.  Neurologic: CN 2-12 intact. Pain and light touch intact in extremities.  Symmetrical.  Speech is fluent. Motor exam as listed above. Psychiatric: Judgment intact, Mood & affect appropriate for pt's clinical situation. Dermatologic: Venous rashes no ulcers noted.  No changes consistent with cellulitis. Lymph : No lichenification or skin changes of chronic lymphedema.  CBC Lab Results  Component Value Date   WBC 6.9 07/19/2019   HGB 15.3 07/19/2019   HCT 47.2 (H) 07/19/2019   MCV 91 07/19/2019   PLT 245 07/19/2019    BMET    Component Value Date/Time   NA 137 12/25/2020 1014   NA 138 07/19/2019 0951   K 4.3 12/25/2020 1014   CL 99 12/25/2020 1014   CO2 28 12/25/2020 1014   GLUCOSE 187 (H) 12/25/2020 1014   BUN  13 12/25/2020 1014   BUN 12 07/19/2019 0951   CREATININE 0.76 12/25/2020 1014   CALCIUM 9.9 12/25/2020 1014   GFRNONAA 101 07/19/2019 0951   GFRAA 117 07/19/2019 0951   CrCl cannot be calculated (Patient's most recent lab result is older than the maximum 21 days allowed.).  COAG No results found for: INR, PROTIME  Radiology VAS [place] LOWER EXTREMITY VENOUS POST ABLATION  Result Date: 04/17/2021  Lower Venous Reflux Study Patient Name:  CICLALY MULCAHEY  Date of Exam:   04/16/2021 Medical Rec #: [id number]              Accession #:    [id number] Date of Birth: 07-22-1965             Patient Gender: F Patient Age:   26 years Exam Location:  [facility] Procedure:      VAS [place] LOWER EXTREMITY VENOUS POST ABLATION Referring Phys: Belenda Cruise Eziah Negro --------------------------------------------------------------------------------  Indications: Varicosities, and Pain.  Performing Technologist: Blondell Reveal RT, RDMS, RVT  Examination Guidelines: A complete evaluation includes B-mode imaging, spectral Doppler, color Doppler, and power Doppler as needed of all accessible  portions of each vessel. Bilateral testing is considered an integral part of a complete examination. Limited examinations for reoccurring indications may be performed as noted. The reflux portion of the exam is performed with the patient in reverse Trendelenburg. Significant venous reflux is defined as >500 ms in the superficial venous system, and >1 second in the deep venous system.  Summary: Left: - No evidence of deep vein thrombosis seen in the left lower extremity, from the common femoral through the popliteal veins. - The GSV appears occluded from the distal knee region to the mid thigh and partially-occuded from the mid thigh to roughly 4.2cm from the SFJ.  *See table(s) above for measurements and observations. Electronically signed by Hortencia Pilar MD on 04/17/2021 at 3:10:50 PM.    Final      Assessment/Plan 1. Varicose veins with pain Recommend:  The patient has had successful ablation of the previously incompetent saphenous venous system but still has persistent symptoms of pain and swelling that are having a negative impact on daily life and daily activities.  Patient should undergo injection sclerotherapy to treat the residual varicosities.  The risks, benefits and alternative therapies were reviewed in detail with the patient.  All questions were answered.  The patient agrees to proceed with sclerotherapy at their convenience.  The patient will continue wearing the graduated compression stockings and using the over-the-counter pain medications to treat her symptoms.       2. Type 2 diabetes mellitus with hyperglycemia, with long-term current use of insulin (HCC) Continue hypoglycemic medications as already ordered, these medications have been reviewed and there are no changes at this time.  Hgb A1C to be monitored as already arranged by primary service   3. Hypothyroidism, unspecified type Continue hormone replacement as ordered and reviewed, no changes at this time   4.  Elevated triglycerides with high cholesterol Continue statin as ordered and reviewed, no changes at this time     Hortencia Pilar, MD  05/11/2021 4:25 PM

## 2021-05-20 ENCOUNTER — Telehealth (INDEPENDENT_AMBULATORY_CARE_PROVIDER_SITE_OTHER): Payer: Self-pay | Admitting: Vascular Surgery

## 2021-05-20 NOTE — Telephone Encounter (Signed)
Documentation only.

## 2021-06-03 ENCOUNTER — Other Ambulatory Visit: Payer: Self-pay

## 2021-06-03 ENCOUNTER — Encounter (INDEPENDENT_AMBULATORY_CARE_PROVIDER_SITE_OTHER): Payer: Self-pay | Admitting: Nurse Practitioner

## 2021-06-03 ENCOUNTER — Ambulatory Visit (INDEPENDENT_AMBULATORY_CARE_PROVIDER_SITE_OTHER): Payer: Managed Care, Other (non HMO) | Admitting: Nurse Practitioner

## 2021-06-03 VITALS — BP 114/77 | HR 97 | Resp 16 | Ht 72.0 in | Wt 238.0 lb

## 2021-06-03 DIAGNOSIS — I83819 Varicose veins of unspecified lower extremities with pain: Secondary | ICD-10-CM

## 2021-06-03 NOTE — Progress Notes (Signed)
Varicose veins of bilateral  lower extremity with inflammation (454.1  I83.10) Current Plans   Indication: Patient presents with symptomatic varicose veins of the bilateral  lower extremity.   Procedure: Sclerotherapy using hypertonic saline mixed with 1% Lidocaine was performed on the bilateral lower extremity. Compression wraps were placed. The patient tolerated the procedure well. 

## 2021-06-25 ENCOUNTER — Other Ambulatory Visit: Payer: Self-pay | Admitting: Physician Assistant

## 2021-06-25 ENCOUNTER — Ambulatory Visit (INDEPENDENT_AMBULATORY_CARE_PROVIDER_SITE_OTHER): Payer: Managed Care, Other (non HMO) | Admitting: Nurse Practitioner

## 2021-06-25 ENCOUNTER — Encounter (INDEPENDENT_AMBULATORY_CARE_PROVIDER_SITE_OTHER): Payer: Self-pay | Admitting: Nurse Practitioner

## 2021-06-25 VITALS — BP 114/74 | HR 92 | Resp 16 | Wt 242.2 lb

## 2021-06-25 DIAGNOSIS — I83819 Varicose veins of unspecified lower extremities with pain: Secondary | ICD-10-CM

## 2021-06-25 NOTE — Telephone Encounter (Signed)
Requested Prescriptions  ?Pending Prescriptions Disp Refills  ?? levothyroxine (SYNTHROID) 88 MCG tablet [Pharmacy Med Name: LEVOTHYROXINE 88 MCG TABLET] 90 tablet 0  ?  Sig: TAKE 1 TABLET BY MOUTH EVERY DAY BEFORE BREAKFAST  ?  ? Endocrinology:  Hypothyroid Agents Passed - 06/25/2021  2:18 AM  ?  ?  Passed - TSH in normal range and within 360 days  ?  TSH  ?Date Value Ref Range Status  ?08/05/2020 1.030 0.450 - 4.500 uIU/mL Final  ?   ?  ?  Passed - Valid encounter within last 12 months  ?  Recent Outpatient Visits   ?      ? 10 months ago Routine health maintenance  ? [place], PA-C  ? 1 year ago Insomnia, unspecified type  ? [place], FNP  ? 1 year ago Depression, recurrent ([place])  ? [facility] Flinchum, Kelby Aline, FNP  ? 2 years ago Type 2 diabetes mellitus with hyperglycemia, with long-term current use of insulin ([place])  ? [facility] Flinchum, Kelby Aline, FNP  ? 2 years ago Type 2 diabetes mellitus with hyperglycemia, with long-term current use of insulin ([place])  ? [place], FNP  ?  ?  ?Future Appointments   ?        ? In 1 month Drubel, Delman Cheadle [facility]  ?  ? ?  ?  ?  ? ? ?

## 2021-07-06 ENCOUNTER — Encounter (INDEPENDENT_AMBULATORY_CARE_PROVIDER_SITE_OTHER): Payer: Self-pay | Admitting: Nurse Practitioner

## 2021-07-06 NOTE — Progress Notes (Signed)
Varicose veins of bilateral  lower extremity with inflammation (454.1  I83.10) Current Plans   Indication: Patient presents with symptomatic varicose veins of the bilateral  lower extremity.   Procedure: Sclerotherapy using hypertonic saline mixed with 1% Lidocaine was performed on the bilateral lower extremity. Compression wraps were placed. The patient tolerated the procedure well. 

## 2021-07-16 ENCOUNTER — Ambulatory Visit: Payer: Managed Care, Other (non HMO) | Admitting: Adult Health

## 2021-07-23 ENCOUNTER — Encounter (INDEPENDENT_AMBULATORY_CARE_PROVIDER_SITE_OTHER): Payer: Self-pay | Admitting: Nurse Practitioner

## 2021-07-23 ENCOUNTER — Ambulatory Visit (INDEPENDENT_AMBULATORY_CARE_PROVIDER_SITE_OTHER): Payer: Managed Care, Other (non HMO) | Admitting: Nurse Practitioner

## 2021-07-23 VITALS — BP 120/79 | HR 89 | Resp 17 | Ht 72.0 in | Wt 252.0 lb

## 2021-07-23 DIAGNOSIS — I83819 Varicose veins of unspecified lower extremities with pain: Secondary | ICD-10-CM

## 2021-07-29 ENCOUNTER — Encounter (INDEPENDENT_AMBULATORY_CARE_PROVIDER_SITE_OTHER): Payer: Self-pay | Admitting: Nurse Practitioner

## 2021-07-29 NOTE — Progress Notes (Signed)
Varicose veins of right  lower extremity with inflammation (454.1  I83.10) ?Current Plans ?  ?Indication: Patient presents with symptomatic varicose veins of the right  lower extremity. ?  ?Procedure: Sclerotherapy using hypertonic saline mixed with 1% Lidocaine was performed on the right lower extremity. Compression wraps were placed. The patient tolerated the procedure well.  ? ?The patient has a persistent large varicosity in her right lower extremity.  Despite multiple rounds of sclerotherapy this has continued to cause the patient pain and it has not resolved.  I suspect that this varicosity is too large to be effectively treated with saline sclerotherapy.  Based on this I recommended the patient undergo follow-up sclerotherapy of the right lower extremity.  The risk benefits and alternatives were discussed the patient.  She is agreeable to proceed. ?

## 2021-08-05 NOTE — Progress Notes (Unsigned)
I,Valerie Schmitt,acting as a Education administrator for [employer], PA-C.,have documented all relevant documentation on the behalf of Valerie Kirschner, PA-C,as directed by  Valerie Kirschner, PA-C while in the presence of Valerie Kirschner, PA-C.   Complete physical exam   Patient: Valerie Schmitt   DOB: June 14, 1965   56 y.o. Female  MRN: [id number] Visit Date: 08/06/2021  Today's healthcare provider: [name], PA-C   No chief complaint on file.  Subjective    Valerie Schmitt is a 56 y.o. female who presents today for a complete physical exam.  She reports consuming a {diet types:17450} diet. {Exercise:19826} She generally feels {well/fairly well/poorly:18703}. She reports sleeping {well/fairly well/poorly:18703}. She {does/does not:200015} have additional problems to discuss today.  HPI  ***  Past Medical History:  Diagnosis Date   Allergy    Anxiety    Arthritis    Asthma    Depression    Diabetes mellitus without complication ([place])    Ketoacidosis 03/2016   Sleep apnea    Thyroid disease    Past Surgical History:  Procedure Laterality Date   BREAST SURGERY     HERNIA REPAIR     REDUCTION MAMMAPLASTY Bilateral 2003   TUBAL LIGATION     Social History   Socioeconomic History   Marital status: Divorced    Spouse name: Not on file   Number of children: Not on file   Years of education: Not on file   Highest education level: Not on file  Occupational History   Not on file  Tobacco Use   Smoking status: Never   Smokeless tobacco: Never  Substance and Sexual Activity   Alcohol use: Not Currently   Drug use: Never   Sexual activity: Not Currently  Other Topics Concern   Not on file  Social History Narrative   Not on file   Social Determinants of Health   Financial Resource Strain: Not on file  Food Insecurity: Not on file  Transportation Needs: Not on file  Physical Activity: Not on file  Stress: Not on file  Social Connections: Not on file  Intimate  Partner Violence: Not on file   Family Status  Relation Name Status   Father  Deceased   Daughter  (Not Specified)   MGM  (Not Specified)   PGM  (Not Specified)   PGF  (Not Specified)   Family History  Problem Relation Age of Onset   Diabetes Father    Scoliosis Daughter    Autoimmune disease Daughter    Sleep apnea Daughter    Anxiety disorder Daughter    Depression Daughter    Personality disorder Daughter    Osteoporosis Maternal Grandmother    Alzheimer's disease Paternal Grandmother    Liver disease Paternal Grandfather    No Known Allergies  Patient Care Team: Schmitt, Valerie Aline, FNP as PCP - General (Family Medicine)   Medications: Outpatient Medications Prior to Visit  Medication Sig   atorvastatin (LIPITOR) 80 MG tablet Take 1 tablet (80 mg total) by mouth every evening.   Blood Glucose Monitoring Suppl (ONETOUCH ULTRALINK) w/Device KIT by Does not apply route 3 (three) times daily.   Continuous Blood Gluc Sensor (DEXCOM G6 SENSOR) MISC 1 Device by Does not apply route as directed.   Continuous Blood Gluc Transmit (DEXCOM G6 TRANSMITTER) MISC 1 Device by Does not apply route as directed.   dapagliflozin propanediol (FARXIGA) 10 MG TABS tablet Take 1 tablet (10 mg total) by mouth daily before breakfast.  DULoxetine (CYMBALTA) 60 MG capsule Take 1 capsule (60 mg total) by mouth 2 (two) times daily.   icosapent Ethyl (VASCEPA) 1 g capsule Take 2 g by mouth 2 (two) times daily.   Insulin Glargine (BASAGLAR KWIKPEN) 100 UNIT/ML Inject 40 Units into the skin daily.   insulin lispro (HUMALOG KWIKPEN) 200 UNIT/ML KwikPen Inject 14 Units into the skin with breakfast, with lunch, and with evening meal. Max daily 75 units with correction scale   Insulin Pen Needle 31G X 5 MM MISC 1 Device by Does not apply route in the morning, at noon, in the evening, and at bedtime.   levothyroxine (SYNTHROID) 88 MCG tablet TAKE 1 TABLET BY MOUTH EVERY DAY BEFORE BREAKFAST   metFORMIN  (GLUCOPHAGE) 1000 MG tablet TAKE 1 TABLET (1,000 MG TOTAL) BY MOUTH 2 (TWO) TIMES DAILY WITH A MEAL.   Semaglutide, 2 MG/DOSE, (OZEMPIC, 2 MG/DOSE,) 8 MG/3ML SOPN Inject 2 mg into the skin once a week.   traZODone (DESYREL) 100 MG tablet TAKE 1 TABLET BY MOUTH AT BEDTIME. PLEASE SCHEDULE AN OFFICE VISIT BEFORE ANYMORE REFILLS.   No facility-administered medications prior to visit.    Review of Systems  {Labs  Heme  Chem  Endocrine  Serology  Results Review (optional):23779}  Objective    There were no vitals taken for this visit. {Show previous vital signs (optional):23777}   Physical Exam  ***  Last depression screening scores    08/05/2020    1:37 PM 04/20/2019   10:06 AM  PHQ 2/9 Scores  PHQ - 2 Score 0 6  PHQ- 9 Score  18   Last fall risk screening    08/05/2020    1:36 PM  [place] in the past year? 0  Number falls in past yr: 0  Injury with Fall? 0   Last Audit-C alcohol use screening    08/05/2020    1:36 PM  Alcohol Use Disorder Test (AUDIT)  1. How often do you have a drink containing alcohol? 1  2. How many drinks containing alcohol do you have on a typical day when you are drinking? 0  3. How often do you have six or more drinks on one occasion? 0  AUDIT-C Score 1   A score of 3 or more in women, and 4 or more in men indicates increased risk for alcohol abuse, EXCEPT if all of the points are from question 1   No results found for any visits on 08/06/21.  Assessment & Plan    Routine Health Maintenance and Physical Exam  Exercise Activities and Dietary recommendations  Goals   None     Immunization History  Administered Date(s) Administered   PFIZER Comirnaty(Gray [place])Covid-19 Tri-Sucrose Vaccine 07/23/2020    Health Maintenance  Topic Date Due   OPHTHALMOLOGY EXAM  Never done   HIV Screening  Never done   Hepatitis C Screening  Never done   TETANUS/TDAP  Never done   PAP SMEAR-Modifier  Never done   COLONOSCOPY (Pts 45-66yr  Insurance coverage will need to be confirmed)  Never done   Zoster Vaccines- Shingrix (1 of 2) Never done   FOOT EXAM  04/24/2020   COVID-19 Vaccine (2 - Pfizer series) 08/13/2020   URINE MICROALBUMIN  07/03/2021   INFLUENZA VACCINE  10/14/2021   HEMOGLOBIN A1C  10/28/2021   MAMMOGRAM  06/29/2022   HPV VACCINES  Aged Out    Discussed health benefits of physical activity, and encouraged her to engage in  regular exercise appropriate for her age and condition.  ***  No follow-ups on file.     {provider attestation***:1}   Valerie Kirschner, PA-C  [facility] [phone] (phone) [phone] (fax)  [place]

## 2021-08-06 ENCOUNTER — Ambulatory Visit (INDEPENDENT_AMBULATORY_CARE_PROVIDER_SITE_OTHER): Payer: Managed Care, Other (non HMO) | Admitting: Physician Assistant

## 2021-08-06 ENCOUNTER — Ambulatory Visit: Payer: Managed Care, Other (non HMO) | Admitting: Family Medicine

## 2021-08-06 ENCOUNTER — Encounter: Payer: Self-pay | Admitting: Physician Assistant

## 2021-08-06 VITALS — BP 117/85 | HR 93 | Ht 72.0 in | Wt 242.6 lb

## 2021-08-06 DIAGNOSIS — F5101 Primary insomnia: Secondary | ICD-10-CM

## 2021-08-06 DIAGNOSIS — E1165 Type 2 diabetes mellitus with hyperglycemia: Secondary | ICD-10-CM

## 2021-08-06 DIAGNOSIS — Z1159 Encounter for screening for other viral diseases: Secondary | ICD-10-CM

## 2021-08-06 DIAGNOSIS — F339 Major depressive disorder, recurrent, unspecified: Secondary | ICD-10-CM

## 2021-08-06 DIAGNOSIS — Z1231 Encounter for screening mammogram for malignant neoplasm of breast: Secondary | ICD-10-CM

## 2021-08-06 DIAGNOSIS — G43109 Migraine with aura, not intractable, without status migrainosus: Secondary | ICD-10-CM

## 2021-08-06 DIAGNOSIS — E1169 Type 2 diabetes mellitus with other specified complication: Secondary | ICD-10-CM | POA: Diagnosis not present

## 2021-08-06 DIAGNOSIS — I83819 Varicose veins of unspecified lower extremities with pain: Secondary | ICD-10-CM | POA: Diagnosis not present

## 2021-08-06 DIAGNOSIS — Z124 Encounter for screening for malignant neoplasm of cervix: Secondary | ICD-10-CM

## 2021-08-06 DIAGNOSIS — E785 Hyperlipidemia, unspecified: Secondary | ICD-10-CM

## 2021-08-06 DIAGNOSIS — Z0000 Encounter for general adult medical examination without abnormal findings: Secondary | ICD-10-CM

## 2021-08-06 DIAGNOSIS — Z1211 Encounter for screening for malignant neoplasm of colon: Secondary | ICD-10-CM

## 2021-08-06 DIAGNOSIS — E039 Hypothyroidism, unspecified: Secondary | ICD-10-CM

## 2021-08-06 DIAGNOSIS — Z794 Long term (current) use of insulin: Secondary | ICD-10-CM

## 2021-08-06 MED ORDER — DULOXETINE HCL 60 MG PO CPEP: 60.0000 mg | ORAL_CAPSULE | Freq: Every day | ORAL | 1 refills | Status: DC

## 2021-08-06 MED ORDER — RIZATRIPTAN BENZOATE 10 MG PO TBDP: 10.0000 mg | ORAL_TABLET | ORAL | 0 refills | Status: DC | PRN

## 2021-08-06 MED ORDER — TRAZODONE HCL 100 MG PO TABS: 200.0000 mg | ORAL_TABLET | Freq: Every evening | ORAL | 1 refills | Status: DC | PRN

## 2021-08-06 MED ORDER — ATORVASTATIN CALCIUM 80 MG PO TABS: 80.0000 mg | ORAL_TABLET | Freq: Every evening | ORAL | 1 refills | Status: DC

## 2021-08-06 NOTE — Assessment & Plan Note (Signed)
Managed with levothyroxine 88 mcg Will recheck tsh/t4 today

## 2021-08-06 NOTE — Assessment & Plan Note (Addendum)
Managed with cymbalta with no breakthrough symptoms Denies seeing therapist, no interest in referral today Will refill, pt takes cymbalta 60 mg daily.

## 2021-08-06 NOTE — Assessment & Plan Note (Addendum)
Managed by endocrinology. Has follow up next month Managing with metformin, farxiga, ozempic, insulin. Uses dexcom Up to date on eye exam will obtain records Foot exam today wnl Will order uacr today ----* cancelled pt unable to provide urine On statin deferring a1c check to endo per pt

## 2021-08-06 NOTE — Assessment & Plan Note (Addendum)
Last lipid panel was uncontrolled but was not taking medication Managed w/ atorvastatin 80 mg Will recheck fasting lipids today

## 2021-08-06 NOTE — Assessment & Plan Note (Signed)
Advised she f/u with vascular and if they cannot determine etiology we can discuss

## 2021-08-06 NOTE — Progress Notes (Signed)
I,[name],acting as a Education administrator for [employer], PA-C.,have documented all relevant documentation on the behalf of Valerie Kirschner, PA-C,as directed by  Valerie Kirschner, PA-C while in the presence of Valerie Kirschner, PA-C.   Complete physical exam   Patient: Valerie Schmitt   DOB: [birth year]   56 y.o. Female  MRN: [id number] Visit Date: 08/06/2021  Today's healthcare provider: [name], PA-C   Cc. cpe  Subjective    [name] is a 56 y.o. female who presents today for a complete physical exam.  She reports consuming a general and watching sugar intake due to diabetes  diet.  Patient reports walking dogs daily and using the treadmill a few times a week for at least 30 minutes.  She generally feels well. She reports sleeping poorly. She does not have additional problems to discuss today.  HPI   Type 2 DM -Managed by endocrinology. Would like to wait until that appointment to have A1c checked. 90 day fasting glucose average 147. Denies current complications. Up to date on eye exam.  History of migraines with aura -Years since she has had one, had two w/ visual aura in April. Aura of a crescent moon in both eyes, 30 minutes later headache, took excedrin migraine. Used to take maxalt ODT. Reports history of full w/u . -Currently has IUD 3-4 years, no cycle since then. Denies any vasovagal / menopausal symptoms.   Reports last pap was in [place], requests referral to obgyn.   Varicose vein / leg pain -Follows with vascular, still having pain in her calves, mainly at night. Denies swelling, paresthesias, cramping.    Past Medical History:  Diagnosis Date   Allergy    Anxiety    Arthritis    Asthma    Depression    Diabetes mellitus without complication ([place])    Ketoacidosis 03/2016   Sleep apnea    Thyroid disease    Past Surgical History:  Procedure Laterality Date   BREAST SURGERY     HERNIA REPAIR     REDUCTION MAMMAPLASTY Bilateral 2003    TUBAL LIGATION     Social History   Socioeconomic History   Marital status: Divorced    Spouse name: Not on file   Number of children: Not on file   Years of education: Not on file   Highest education level: Not on file  Occupational History   Not on file  Tobacco Use   Smoking status: Never   Smokeless tobacco: Never  Substance and Sexual Activity   Alcohol use: Not Currently   Drug use: Never   Sexual activity: Not Currently  Other Topics Concern   Not on file  Social History Narrative   Not on file   Social Determinants of Health   Financial Resource Strain: Not on file  Food Insecurity: Not on file  Transportation Needs: Not on file  Physical Activity: Not on file  Stress: Not on file  Social Connections: Not on file  Intimate Partner Violence: Not on file   Family Status  Relation Name Status   Father  Deceased   Daughter  (Not Specified)   MGM  (Not Specified)   PGM  (Not Specified)   PGF  (Not Specified)   Family History  Problem Relation Age of Onset   Diabetes Father    Scoliosis Daughter    Autoimmune disease Daughter    Sleep apnea Daughter    Anxiety disorder Daughter    Depression Daughter  Personality disorder Daughter    Osteoporosis Maternal Grandmother    Alzheimer's disease Paternal Grandmother    Liver disease Paternal Grandfather    No Known Allergies  Patient Care Team: Valerie Kirschner, PA-C as PCP - General (Physician Assistant) Anell Barr, OD (Optometry)   Medications: Outpatient Medications Prior to Visit  Medication Sig   Blood Glucose Monitoring Suppl (ONETOUCH ULTRALINK) w/Device KIT by Does not apply route 3 (three) times daily.   Continuous Blood Gluc Sensor (DEXCOM G6 SENSOR) MISC 1 Device by Does not apply route as directed.   Continuous Blood Gluc Transmit (DEXCOM G6 TRANSMITTER) MISC 1 Device by Does not apply route as directed.   dapagliflozin propanediol (FARXIGA) 10 MG TABS tablet Take 1 tablet (10 mg  total) by mouth daily before breakfast.   Insulin Glargine (BASAGLAR KWIKPEN) 100 UNIT/ML Inject 40 Units into the skin daily.   insulin lispro (HUMALOG KWIKPEN) 200 UNIT/ML KwikPen Inject 14 Units into the skin with breakfast, with lunch, and with evening meal. Max daily 75 units with correction scale   Insulin Pen Needle 31G X 5 MM MISC 1 Device by Does not apply route in the morning, at noon, in the evening, and at bedtime.   levothyroxine (SYNTHROID) 88 MCG tablet TAKE 1 TABLET BY MOUTH EVERY DAY BEFORE BREAKFAST   metFORMIN (GLUCOPHAGE) 1000 MG tablet TAKE 1 TABLET (1,000 MG TOTAL) BY MOUTH 2 (TWO) TIMES DAILY WITH A MEAL.   Semaglutide, 2 MG/DOSE, (OZEMPIC, 2 MG/DOSE,) 8 MG/3ML SOPN Inject 2 mg into the skin once a week.   [DISCONTINUED] atorvastatin (LIPITOR) 80 MG tablet Take 1 tablet (80 mg total) by mouth every evening.   [DISCONTINUED] traZODone (DESYREL) 100 MG tablet TAKE 1 TABLET BY MOUTH AT BEDTIME. PLEASE SCHEDULE AN OFFICE VISIT BEFORE ANYMORE REFILLS.   icosapent Ethyl (VASCEPA) 1 g capsule Take 2 g by mouth 2 (two) times daily. (Patient not taking: Reported on 08/06/2021)   [DISCONTINUED] DULoxetine (CYMBALTA) 60 MG capsule Take 1 capsule (60 mg total) by mouth 2 (two) times daily.   No facility-administered medications prior to visit.    Review of Systems  Constitutional:  Negative for fatigue and fever.  Respiratory:  Negative for cough and shortness of breath.   Cardiovascular:  Negative for chest pain and leg swelling.  Gastrointestinal:  Negative for abdominal pain.  Musculoskeletal:  Positive for myalgias.  Neurological:  Positive for headaches. Negative for dizziness.     Objective    Blood pressure 117/85, pulse 93, height 6' (1.829 m), weight 242 lb 9.6 oz (110 kg), SpO2 100 %.    Physical Exam Constitutional:      General: She is awake.     Appearance: She is well-developed. She is not ill-appearing.  HENT:     Head: Normocephalic.     Right Ear:  Tympanic membrane normal.     Left Ear: Tympanic membrane normal.     Nose: Nose normal. No congestion or rhinorrhea.     Mouth/Throat:     Pharynx: No oropharyngeal exudate or posterior oropharyngeal erythema.  Eyes:     Conjunctiva/sclera: Conjunctivae normal.     Pupils: Pupils are equal, round, and reactive to light.  Neck:     Thyroid: No thyroid mass or thyromegaly.  Cardiovascular:     Rate and Rhythm: Normal rate and regular rhythm.     Pulses:          Dorsalis pedis pulses are 3+ on the right side and 3+ on the  left side.     Heart sounds: Normal heart sounds.  Pulmonary:     Effort: Pulmonary effort is normal.     Breath sounds: Normal breath sounds.  Abdominal:     Palpations: Abdomen is soft.     Tenderness: There is no abdominal tenderness.  Musculoskeletal:     Right lower leg: No swelling. No edema.     Left lower leg: No swelling. No edema.  Feet:     Right foot:     Protective Sensation: 4 sites tested.  4 sites sensed.     Skin integrity: Skin integrity normal.     Toenail Condition: Right toenails are normal.     Left foot:     Protective Sensation: 4 sites tested.  4 sites sensed.     Skin integrity: Skin integrity normal.     Toenail Condition: Left toenails are normal.  Lymphadenopathy:     Cervical: No cervical adenopathy.  Skin:    General: Skin is warm.  Neurological:     Mental Status: She is alert and oriented to person, place, and time.  Psychiatric:        Attention and Perception: Attention normal.        Mood and Affect: Mood normal.        Speech: Speech normal.        Behavior: Behavior normal. Behavior is cooperative.     Last depression screening scores    08/06/2021    9:30 AM 08/05/2020    1:37 PM 04/20/2019   10:06 AM  PHQ 2/9 Scores  PHQ - 2 Score 1 0 6  PHQ- 9 Score 4  18   Last fall risk screening    08/05/2020    1:36 PM  [place] in the past year? 0  Number falls in past yr: 0  Injury with Fall? 0   Last  Audit-C alcohol use screening    08/05/2020    1:36 PM  Alcohol Use Disorder Test (AUDIT)  1. How often do you have a drink containing alcohol? 1  2. How many drinks containing alcohol do you have on a typical day when you are drinking? 0  3. How often do you have six or more drinks on one occasion? 0  AUDIT-C Score 1   A score of 3 or more in women, and 4 or more in men indicates increased risk for alcohol abuse, EXCEPT if all of the points are from question 1   No results found for any visits on 08/06/21.  Assessment & Plan    Routine Health Maintenance and Physical Exam  Exercise Activities and Dietary recommendations --balanced diet high in fiber and protein, low in sugars, carbs, fats. --physical activity/exercise 30 minutes 3-5 times a week     Immunization History  Administered Date(s) Administered   PFIZER Comirnaty(Gray [place])Covid-19 Tri-Sucrose Vaccine 07/23/2020    Health Maintenance  Topic Date Due   Hepatitis C Screening  Never done   PAP SMEAR-Modifier  Never done   COLONOSCOPY (Pts 45-71yr Insurance coverage will need to be confirmed)  Never done   COVID-19 Vaccine (2 - Pfizer series) 08/13/2020   MAMMOGRAM  06/28/2021   URINE MICROALBUMIN  07/03/2021   OPHTHALMOLOGY EXAM  08/06/2021 (Originally 01/07/1976)   Zoster Vaccines- Shingrix (1 of 2) 11/06/2021 (Originally 01/07/2017)   TETANUS/TDAP  08/07/2022 (Originally 01/07/1986)   HIV Screening  08/07/2022 (Originally 01/07/1982)   INFLUENZA VACCINE  10/14/2021   HEMOGLOBIN A1C  10/28/2021   FOOT EXAM  08/07/2022   HPV VACCINES  Aged Out    Discussed health benefits of physical activity, and encouraged her to engage in regular exercise appropriate for her age and condition.  Problem List Items Addressed This Visit       Cardiovascular and Mediastinum   Varicose veins with pain    Advised she f/u with vascular and if they cannot determine etiology we can discuss       Relevant Medications    atorvastatin (LIPITOR) 80 MG tablet   Migraine with aura and without status migrainosus, not intractable    Will rx maxalt 10 mg odt Will monitor closely for increase in frequency Advised may have been secondary to stress, dehydration, possible perimenopause       Relevant Medications   rizatriptan (MAXALT-MLT) 10 MG disintegrating tablet   DULoxetine (CYMBALTA) 60 MG capsule   traZODone (DESYREL) 100 MG tablet   atorvastatin (LIPITOR) 80 MG tablet     Endocrine   Type 2 diabetes mellitus with hyperglycemia, with long-term current use of insulin ([place])    Managed by endocrinology. Has follow up next month Managing with metformin, farxiga, ozempic, insulin. Uses dexcom Up to date on eye exam will obtain records Foot exam today wnl Will order uacr today ----* cancelled pt unable to provide urine On statin deferring a1c check to endo per pt      Relevant Medications   atorvastatin (LIPITOR) 80 MG tablet   Other Relevant Orders   Comprehensive Metabolic Panel (CMET)   CBC w/Diff/Platelet   Hypothyroidism    Managed with levothyroxine 88 mcg Will recheck tsh/t4 today       Relevant Orders   CBC w/Diff/Platelet   TSH + free T4   Hyperlipidemia associated with type 2 diabetes mellitus ([place])    Last lipid panel was uncontrolled but was not taking medication Managed w/ atorvastatin 80 mg Will recheck fasting lipids today       Relevant Medications   atorvastatin (LIPITOR) 80 MG tablet   Other Relevant Orders   Comprehensive Metabolic Panel (CMET)   Lipid Profile     Other   Depression, recurrent ([place])    Managed with cymbalta with no breakthrough symptoms Denies seeing therapist, no interest in referral today Will refill, pt takes cymbalta 60 mg daily.        Relevant Medications   DULoxetine (CYMBALTA) 60 MG capsule   traZODone (DESYREL) 100 MG tablet   Other Relevant Orders   CBC w/Diff/Platelet   Primary insomnia    Managed with trazodone 200 mg  nightly. Will refill       Relevant Medications   traZODone (DESYREL) 100 MG tablet   Other Visit Diagnoses     Encounter for health maintenance examination    -  Primary   Encounter for hepatitis C screening test for low risk patient       Relevant Orders   Hepatitis C antibody   Colon cancer screening       Relevant Orders   Ambulatory referral to Gastroenterology   Screening mammogram for breast cancer       Relevant Orders   MM 3D SCREEN BREAST BILATERAL   Cervical cancer screening       Relevant Orders   Ambulatory referral to Gynecology      Declines tetanus and shingles vaccines today.  Return in about 6 months (around 02/06/2022) for chronic conditions.     I, Valerie Kirschner, PA-C have reviewed all  documentation for this visit. The documentation on  08/06/2021 for the exam, diagnosis, procedures, and orders are all accurate and complete.  Valerie Kirschner, PA-C [facility] [address] [place], [place], [address] Office: [phone] Fax: [place]

## 2021-08-06 NOTE — Assessment & Plan Note (Signed)
Managed with trazodone 200 mg nightly. Will refill

## 2021-08-06 NOTE — Assessment & Plan Note (Signed)
Will rx maxalt 10 mg odt Will monitor closely for increase in frequency Advised may have been secondary to stress, dehydration, possible perimenopause

## 2021-08-07 ENCOUNTER — Other Ambulatory Visit: Payer: Self-pay | Admitting: Physician Assistant

## 2021-08-07 DIAGNOSIS — D582 Other hemoglobinopathies: Secondary | ICD-10-CM

## 2021-08-07 LAB — CBC WITH DIFFERENTIAL/PLATELET
Basophils Absolute: 0.1 10*3/uL (ref 0.0–0.2)
Basos: 1 %
EOS (ABSOLUTE): 0.3 10*3/uL (ref 0.0–0.4)
Eos: 4 %
Hematocrit: 49 % — ABNORMAL HIGH (ref 34.0–46.6)
Hemoglobin: 16.5 g/dL — ABNORMAL HIGH (ref 11.1–15.9)
Immature Grans (Abs): 0 10*3/uL (ref 0.0–0.1)
Immature Granulocytes: 0 %
Lymphocytes Absolute: 2.1 10*3/uL (ref 0.7–3.1)
Lymphs: 27 %
MCH: 28.9 pg (ref 26.6–33.0)
MCHC: 33.7 g/dL (ref 31.5–35.7)
MCV: 86 fL (ref 79–97)
Monocytes Absolute: 0.5 10*3/uL (ref 0.1–0.9)
Monocytes: 6 %
Neutrophils Absolute: 4.8 10*3/uL (ref 1.4–7.0)
Neutrophils: 62 %
Platelets: 297 10*3/uL (ref 150–450)
RBC: 5.7 x10E6/uL — ABNORMAL HIGH (ref 3.77–5.28)
RDW: 12.4 % (ref 11.7–15.4)
WBC: 7.8 10*3/uL (ref 3.4–10.8)

## 2021-08-07 LAB — COMPREHENSIVE METABOLIC PANEL
ALT: 18 IU/L (ref 0–32)
AST: 17 IU/L (ref 0–40)
Albumin/Globulin Ratio: 1.8 (ref 1.2–2.2)
Albumin: 4.8 g/dL (ref 3.8–4.9)
Alkaline Phosphatase: 69 IU/L (ref 44–121)
BUN/Creatinine Ratio: 23 (ref 9–23)
BUN: 17 mg/dL (ref 6–24)
Bilirubin Total: 0.6 mg/dL (ref 0.0–1.2)
CO2: 23 mmol/L (ref 20–29)
Calcium: 10 mg/dL (ref 8.7–10.2)
Chloride: 100 mmol/L (ref 96–106)
Creatinine, Ser: 0.73 mg/dL (ref 0.57–1.00)
Globulin, Total: 2.6 g/dL (ref 1.5–4.5)
Glucose: 131 mg/dL — ABNORMAL HIGH (ref 70–99)
Potassium: 4.8 mmol/L (ref 3.5–5.2)
Sodium: 141 mmol/L (ref 134–144)
Total Protein: 7.4 g/dL (ref 6.0–8.5)
eGFR: 98 mL/min/{1.73_m2} (ref >59)

## 2021-08-07 LAB — LIPID PANEL
Chol/HDL Ratio: 5 ratio — ABNORMAL HIGH (ref 0.0–4.4)
Cholesterol, Total: 204 mg/dL — ABNORMAL HIGH (ref 100–199)
HDL: 41 mg/dL (ref >39)
LDL Chol Calc (NIH): 139 mg/dL — ABNORMAL HIGH (ref 0–99)
Triglycerides: 131 mg/dL (ref 0–149)
VLDL Cholesterol Cal: 24 mg/dL (ref 5–40)

## 2021-08-07 LAB — TSH+FREE T4
Free T4: 1.38 ng/dL (ref 0.82–1.77)
TSH: 1.96 u[IU]/mL (ref 0.450–4.500)

## 2021-08-07 LAB — HEPATITIS C ANTIBODY: Hep C Virus Ab: NONREACTIVE

## 2021-08-08 ENCOUNTER — Telehealth: Payer: Self-pay

## 2021-08-08 NOTE — Telephone Encounter (Signed)
CALLED PATIENT NO ANSWER LEFT VOICEMAIL FOR A CALL BACK °Letter sent °

## 2021-08-16 ENCOUNTER — Other Ambulatory Visit: Payer: Self-pay | Admitting: Internal Medicine

## 2021-08-18 ENCOUNTER — Telehealth: Payer: Self-pay

## 2021-08-18 ENCOUNTER — Other Ambulatory Visit: Payer: Self-pay

## 2021-08-18 DIAGNOSIS — Z86010 Personal history of colonic polyps: Secondary | ICD-10-CM

## 2021-08-18 MED ORDER — NA SULFATE-K SULFATE-MG SULF 17.5-3.13-1.6 GM/177ML PO SOLN: 1.0000 | Freq: Once | ORAL | 0 refills | Status: AC

## 2021-08-18 NOTE — Telephone Encounter (Signed)
Gastroenterology Pre-Procedure Review  Request Date: 09/19/21 Requesting Physician: Dr. Vicente Males  PATIENT REVIEW QUESTIONS: The patient responded to the following health history questions as indicated:    1. Are you having any GI issues? no 2. Do you have a personal history of Polyps? yes (3 years ago colonoscopy performed in [facility]) 3. Do you have a family history of Colon Cancer or Polyps? no 4. Diabetes Mellitus? yes (type 2) 5. Joint replacements in the past 12 months?no 6. Major health problems in the past 3 months?no 7. Any artificial heart valves, MVP, or defibrillator?no    MEDICATIONS & ALLERGIES:    Patient reports the following regarding taking any anticoagulation/antiplatelet therapy:   Plavix, Coumadin, Eliquis, Xarelto, Lovenox, Pradaxa, Brilinta, or Effient? no Aspirin? no  Patient confirms/reports the following medications:  Current Outpatient Medications  Medication Sig Dispense Refill   atorvastatin (LIPITOR) 80 MG tablet Take 1 tablet (80 mg total) by mouth every evening. 90 tablet 1   Blood Glucose Monitoring Suppl (ONETOUCH ULTRALINK) w/Device KIT by Does not apply route 3 (three) times daily.     Continuous Blood Gluc Sensor (DEXCOM G6 SENSOR) MISC 1 Device by Does not apply route as directed. 9 each 3   Continuous Blood Gluc Transmit (DEXCOM G6 TRANSMITTER) MISC 1 Device by Does not apply route as directed. 1 each 3   dapagliflozin propanediol (FARXIGA) 10 MG TABS tablet Take 1 tablet (10 mg total) by mouth daily before breakfast. 90 tablet 2   DULoxetine (CYMBALTA) 60 MG capsule Take 1 capsule (60 mg total) by mouth daily. 90 capsule 1   icosapent Ethyl (VASCEPA) 1 g capsule Take 2 g by mouth 2 (two) times daily. (Patient not taking: Reported on 08/06/2021)     Insulin Glargine (BASAGLAR KWIKPEN) 100 UNIT/ML Inject 40 Units into the skin daily. 45 mL 1   insulin lispro (HUMALOG KWIKPEN) 200 UNIT/ML KwikPen Inject 14 Units into the skin with breakfast, with lunch,  and with evening meal. Max daily 75 units with correction scale 30 mL 4   Insulin Pen Needle 31G X 5 MM MISC 1 Device by Does not apply route in the morning, at noon, in the evening, and at bedtime. 400 each 1   levothyroxine (SYNTHROID) 88 MCG tablet TAKE 1 TABLET BY MOUTH EVERY DAY BEFORE BREAKFAST 90 tablet 0   metFORMIN (GLUCOPHAGE) 1000 MG tablet TAKE 1 TABLET (1,000 MG TOTAL) BY MOUTH 2 (TWO) TIMES DAILY WITH A MEAL. 180 tablet 1   rizatriptan (MAXALT-MLT) 10 MG disintegrating tablet Take 1 tablet (10 mg total) by mouth as needed for migraine. May repeat in 2 hours if needed 10 tablet 0   Semaglutide, 2 MG/DOSE, (OZEMPIC, 2 MG/DOSE,) 8 MG/3ML SOPN Inject 2 mg into the skin once a week. 9 mL 1   traZODone (DESYREL) 100 MG tablet Take 2 tablets (200 mg total) by mouth at bedtime as needed for sleep. 180 tablet 1   No current facility-administered medications for this visit.    Patient confirms/reports the following allergies:  No Known Allergies  No orders of the defined types were placed in this encounter.   AUTHORIZATION INFORMATION Primary Insurance: 1D#: Group #:  Secondary Insurance: 1D#: Group #:  SCHEDULE INFORMATION: Date: 09/19/21 Time: Location: Vicente Males

## 2021-08-22 ENCOUNTER — Telehealth: Payer: Self-pay

## 2021-08-22 NOTE — Telephone Encounter (Signed)
Copied from [place] [id number]. Topic: General - Other >> Aug 22, 2021  3:24 PM Valerie Schmitt wrote: Reason for CRM: Pt called in stating she talked to someone earlier this week about a paper being left up front for her, and she stated her daughter will be there shortly to pick it up. Please advise.

## 2021-08-26 ENCOUNTER — Other Ambulatory Visit (HOSPITAL_COMMUNITY)
Admission: RE | Admit: 2021-08-26 | Discharge: 2021-08-26 | Disposition: A | Discharge status: Home / Self Care | Payer: Managed Care, Other (non HMO) | Source: Ambulatory Visit | Attending: Licensed Practical Nurse | Admitting: Licensed Practical Nurse

## 2021-08-26 ENCOUNTER — Ambulatory Visit: Payer: Managed Care, Other (non HMO) | Admitting: Licensed Practical Nurse

## 2021-08-26 VITALS — BP 100/64 | Wt 246.0 lb

## 2021-08-26 DIAGNOSIS — Z124 Encounter for screening for malignant neoplasm of cervix: Secondary | ICD-10-CM

## 2021-08-26 DIAGNOSIS — N912 Amenorrhea, unspecified: Secondary | ICD-10-CM

## 2021-08-26 DIAGNOSIS — Z01419 Encounter for gynecological examination (general) (routine) without abnormal findings: Secondary | ICD-10-CM | POA: Insufficient documentation

## 2021-08-26 NOTE — Progress Notes (Unsigned)
Gynecology Annual Exam  PCP: Valerie Kirschner, PA-C  Chief Complaint:  Chief Complaint  Patient presents with   Gynecologic Exam    Patient states that she was referred to our office today for her annual pap exam. Patient states that she would like to discuss symptoms of menopause and states that she has a history of chronic migraines. Patient reports that  in the recent months she has had several migraine headaches and when addressed with PCP was advised that it could be related to her hormone levels.,     History of Present Illness:Patient is a 56 y.o. No obstetric history on file. presents for annual exam. The patient is here for a pap and to find out if she is in menopause, she has not had a cycle since having her IUD.   PCP: [place] exam up to date Wears glasses. 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 (Menstrual status: IUD). Postcoital Bleeding: no Dysmenorrhea: no  The patient is not sexually active. She denies dyspareunia.  The patient does perform self breast exams.  There is no notable family history of breast or ovarian cancer in her family.  The patient wears seatbelts: yes.   The patient has regular exercise: no.    The patient denies current symptoms of depression.     Review of Systems: Review of Systems  Constitutional: Negative.   Eyes: Negative.   Respiratory: Negative.    Cardiovascular: Negative.   Gastrointestinal: Negative.   Genitourinary: Negative.   Musculoskeletal: Negative.   Neurological:  Positive for headaches.  Psychiatric/Behavioral:  The patient is nervous/anxious.     Past Medical History:  Patient Active Problem List   Diagnosis Date Noted   Migraine with aura and without status migrainosus, not intractable 08/06/2021   Primary insomnia 08/06/2021   Varicose veins with pain 11/20/2020   Hyperlipidemia associated with type  2 diabetes mellitus ([place]) 04/26/2019   Depression, recurrent ([place]) 04/20/2019   Type 2 diabetes mellitus with hyperglycemia, with long-term current use of insulin ([place]) 04/20/2019   Glucose found in urine on examination 04/20/2019   Hypothyroidism 04/20/2019   History of use of contraceptive intrauterine device (IUD) 04/20/2019    Past Surgical History:  Past Surgical History:  Procedure Laterality Date   BREAST SURGERY     HERNIA REPAIR     REDUCTION MAMMAPLASTY Bilateral 2003   TUBAL LIGATION      Gynecologic History:  No LMP recorded. (Menstrual status: IUD). Last Pap: Results were:  no abnormalities  Last mammogram: 2022 Results were: BI-RAD I  Obstetric History: No obstetric history on file.  Family History:  Family History  Problem Relation Age of Onset   Diabetes Father    Scoliosis Daughter    Autoimmune disease Daughter    Sleep apnea Daughter    Anxiety disorder Daughter    Depression Daughter    Personality disorder Daughter    Osteoporosis Maternal Grandmother    Alzheimer's disease Paternal Grandmother    Liver disease Paternal Grandfather     Social History:  Social History   Socioeconomic History   Marital status: Divorced    Spouse name: Not on file   Number of children: Not on file   Years of education: Not on file   Highest education level: Not on file  Occupational History   Not on file  Tobacco Use  Smoking status: Never   Smokeless tobacco: Never  Substance and Sexual Activity   Alcohol use: Not Currently   Drug use: Never   Sexual activity: Not Currently  Other Topics Concern   Not on file  Social History Narrative   Not on file   Social Determinants of Health   Financial Resource Strain: Not on file  Food Insecurity: Not on file  Transportation Needs: Not on file  Physical Activity: Not on file  Stress: Not on file  Social Connections: Not on file  Intimate Partner Violence: Not on file    Allergies:  No Known  Allergies  Medications: Prior to Admission medications   Medication Sig Start Date End Date Taking? Authorizing Provider  atorvastatin (LIPITOR) 80 MG tablet Take 1 tablet (80 mg total) by mouth every evening. 08/06/21  Yes Drubel, Ria Comment, PA-C  Blood Glucose Monitoring Suppl (ONETOUCH ULTRALINK) w/Device KIT by Does not apply route 3 (three) times daily.   Yes [provider]  Continuous Blood Gluc Sensor (DEXCOM G6 SENSOR) MISC 1 Device by Does not apply route as directed. 11/04/20  Yes Shamleffer, Melanie Crazier, MD  Continuous Blood Gluc Transmit (DEXCOM G6 TRANSMITTER) MISC 1 Device by Does not apply route as directed. 07/03/20  Yes Shamleffer, Melanie Crazier, MD  dapagliflozin propanediol (FARXIGA) 10 MG TABS tablet Take 1 tablet (10 mg total) by mouth daily before breakfast. 12/25/20  Yes Shamleffer, Melanie Crazier, MD  DULoxetine (CYMBALTA) 60 MG capsule Take 1 capsule (60 mg total) by mouth daily. 08/06/21  Yes Drubel, Ria Comment, PA-C  icosapent Ethyl (VASCEPA) 1 g capsule Take 2 g by mouth 2 (two) times daily.   Yes [provider]  Insulin Glargine (BASAGLAR KWIKPEN) 100 UNIT/ML Inject 40 Units into the skin daily. 12/25/20  Yes Shamleffer, Melanie Crazier, MD  insulin lispro (HUMALOG KWIKPEN) 200 UNIT/ML KwikPen Inject 14 Units into the skin with breakfast, with lunch, and with evening meal. Max daily 75 units with correction scale 07/03/20  Yes Shamleffer, Melanie Crazier, MD  Insulin Pen Needle 31G X 5 MM MISC 1 Device by Does not apply route in the morning, at noon, in the evening, and at bedtime. 07/03/20  Yes Shamleffer, Melanie Crazier, MD  levothyroxine (SYNTHROID) 88 MCG tablet TAKE 1 TABLET BY MOUTH EVERY DAY BEFORE BREAKFAST 06/25/21  Yes Drubel, Ria Comment, PA-C  metFORMIN (GLUCOPHAGE) 1000 MG tablet TAKE 1 TABLET (1,000 MG TOTAL) BY MOUTH 2 (TWO) TIMES DAILY WITH A MEAL. 08/18/21  Yes Shamleffer, Melanie Crazier, MD  rizatriptan (MAXALT-MLT) 10 MG disintegrating  tablet Take 1 tablet (10 mg total) by mouth as needed for migraine. May repeat in 2 hours if needed 08/06/21  Yes Drubel, Ria Comment, PA-C  Semaglutide, 2 MG/DOSE, (OZEMPIC, 2 MG/DOSE,) 8 MG/3ML SOPN Inject 2 mg into the skin once a week. 12/25/20  Yes Shamleffer, Melanie Crazier, MD  traZODone (DESYREL) 100 MG tablet Take 2 tablets (200 mg total) by mouth at bedtime as needed for sleep. 08/06/21  Yes Valerie Kirschner, PA-C    Physical Exam Vitals: Blood pressure 100/64, weight 246 lb (111.6 kg).  General: NAD HEENT: normocephalic, anicteric Thyroid: no enlargement, no palpable nodules Pulmonary: No increased work of breathing, CTAB Cardiovascular: RRR, distal pulses 2+ Breast: Breast symmetrical, no tenderness, no palpable nodules or masses, no skin or nipple retraction present, no nipple discharge.  No axillary or supraclavicular lymphadenopathy. Abdomen: NABS, soft, non-tender, non-distended.  Umbilicus without lesions.  No hepatomegaly, splenomegaly or masses palpable. No evidence of hernia  Genitourinary:  External: Normal external female genitalia.  Normal urethral meatus, normal Bartholin's and Skene's glands.    Vagina: Normal vaginal mucosa, no evidence of prolapse.    Cervix: Grossly normal in appearance, no bleeding  Uterus: Non-enlarged, mobile, normal contour.  No CMT  Adnexa: ovaries non-enlarged, no adnexal masses  Rectal: deferred  Lymphatic: no evidence of inguinal lymphadenopathy Extremities: no edema, erythema, or tenderness Neurologic: Grossly intact Psychiatric: mood appropriate, affect full  Female chaperone present for pelvic and breast  portions of the physical exam     Assessment: 56 y.o. No obstetric history on file. routine annual exam  Plan: Problem List Items Addressed This Visit   None Visit Diagnoses     Well woman exam    -  Primary   Relevant Orders   FSH/LH   Cytology - PAP   Amenorrhea       Relevant Orders   FSH/LH   Cervical cancer screening        Relevant Orders   Cytology - PAP       1) Mammogram - recommend yearly screening mammogram.  Mammogram Is up to date  2) STI screening  wasoffered and declined  3) ASCCP guidelines and rational discussed.  Patient opts for every 5 years screening interval  4) Osteoporosis  -managed by PCP  Consider FDA-approved medical therapies in postmenopausal women and men aged 64 years and older, based on the following: a) A hip or vertebral (clinical or morphometric) fracture b) T-score ? -2.5 at the femoral neck or spine after appropriate evaluation to exclude secondary causes C) Low bone mass (T-score between -1.0 and -2.5 at the femoral neck or spine) and a 10-year probability of a hip fracture ? 3% or a 10-year probability of a major osteoporosis-related fracture ? 20% based on the [place]-adapted WHO algorithm   5) Routine healthcare maintenance including cholesterol, diabetes screening discussed managed by PCP  6) Colonoscopy scheduled 09/19/2021 .  Screening recommended starting at age 48 for average risk individuals, age 90 for individuals deemed at increased risk (including African Americans) and recommended to continue until age 45.  For patient age 52-85 individualized approach is recommended.  Gold standard screening is via colonoscopy, Cologuard screening is an acceptable alternative for patient unwilling or unable to undergo colonoscopy.  "Colorectal cancer screening for average?risk adults: 2018 guideline update from the American Cancer Society"CA: A Cancer Journal for Clinicians: Aug 12, 2016   7) Return in 1 year, or sooner if you desire top have IUD removed.    Roberto Scales, [facility] 08/26/2021, 3:00 PM

## 2021-08-27 ENCOUNTER — Encounter: Payer: Self-pay | Admitting: Licensed Practical Nurse

## 2021-08-27 LAB — FSH/LH
FSH: 43.8 m[IU]/mL
LH: 20.9 m[IU]/mL

## 2021-09-03 ENCOUNTER — Encounter: Payer: Self-pay | Admitting: Internal Medicine

## 2021-09-03 ENCOUNTER — Ambulatory Visit: Payer: Managed Care, Other (non HMO) | Admitting: Internal Medicine

## 2021-09-03 VITALS — BP 110/60 | HR 94 | Ht 72.0 in | Wt 244.0 lb

## 2021-09-03 DIAGNOSIS — E782 Mixed hyperlipidemia: Secondary | ICD-10-CM | POA: Diagnosis not present

## 2021-09-03 DIAGNOSIS — E1165 Type 2 diabetes mellitus with hyperglycemia: Secondary | ICD-10-CM | POA: Diagnosis not present

## 2021-09-03 DIAGNOSIS — Z794 Long term (current) use of insulin: Secondary | ICD-10-CM | POA: Diagnosis not present

## 2021-09-03 LAB — POCT GLYCOSYLATED HEMOGLOBIN (HGB A1C): Hemoglobin A1C: 5.9 % — AB (ref 4.0–5.6)

## 2021-09-03 MED ORDER — HUMALOG KWIKPEN 200 UNIT/ML ~~LOC~~ SOPN
PEN_INJECTOR | SUBCUTANEOUS | 3 refills | Status: DC
Start: 2021-09-03 — End: 2022-03-17

## 2021-09-03 MED ORDER — INSULIN PEN NEEDLE 31G X 5 MM MISC: 1.0000 | Freq: Four times a day (QID) | 3 refills | Status: DC

## 2021-09-03 MED ORDER — DAPAGLIFLOZIN PROPANEDIOL 10 MG PO TABS: 10.0000 mg | ORAL_TABLET | Freq: Every day | ORAL | 2 refills | Status: DC

## 2021-09-03 MED ORDER — ROSUVASTATIN CALCIUM 40 MG PO TABS: 40.0000 mg | ORAL_TABLET | Freq: Every day | ORAL | 3 refills | Status: DC

## 2021-09-03 MED ORDER — BASAGLAR KWIKPEN 100 UNIT/ML ~~LOC~~ SOPN: 30.0000 [IU] | PEN_INJECTOR | Freq: Every day | SUBCUTANEOUS | 3 refills | Status: DC

## 2021-09-03 MED ORDER — OZEMPIC (2 MG/DOSE) 8 MG/3ML ~~LOC~~ SOPN: 2.0000 mg | PEN_INJECTOR | SUBCUTANEOUS | 3 refills | Status: DC

## 2021-09-03 NOTE — Patient Instructions (Addendum)
-   Keep Up the Good Work ! - Continue Ozempic 2 mg weekly  - Continue Farxiga 10 mg daily  - Decrease Basaglar  30 units daily  - Continue Metformin 1000 mg BID  - Decrease  Humalog 10 units with Brunch and 12 with Supper   - Humalog correctional insulin: ADD extra units on insulin to your meal-time Humalog dose if your blood sugars are higher than 150. Use the scale below to help guide you:   Blood sugar before meal Number of units to inject  Less than 150 0 unit  151 -  170 1 units  171 -  190 2 units  191 -  210 3 units  211 -  230 4 units  231 -  250 5 units  251 -  270 6 units  271 -  290 7 units  291 -  310 8 units  311 - 330 9 units         HOW TO TREAT LOW BLOOD SUGARS (Blood sugar LESS THAN 70 MG/DL) Please follow the RULE OF 15 for the treatment of hypoglycemia treatment (when your (blood sugars are less than 70 mg/dL)   STEP 1: Take 15 grams of carbohydrates when your blood sugar is low, which includes:  3-4 GLUCOSE TABS  OR 3-4 OZ OF JUICE OR REGULAR SODA OR ONE TUBE OF GLUCOSE GEL    STEP 2: RECHECK blood sugar in 15 MINUTES STEP 3: If your blood sugar is still low at the 15 minute recheck --> then, go back to STEP 1 and treat AGAIN with another 15 grams of carbohydrates.

## 2021-09-03 NOTE — Progress Notes (Signed)
Name: Valerie Schmitt  Age/ Sex: 56 y.o., female   MRN/ DOB: [id number], 02-Sep-1965     PCP: [name], PA-C   Reason for Endocrinology Evaluation: Type 2 Diabetes Mellitus  Initial Endocrine Consultative Visit: 04/26/2019    PATIENT IDENTIFIER: Valerie Schmitt is a 56 y.o. female with a past medical history of T2DM and Dyslipidemia. The patient has followed with Endocrinology clinic since 04/26/2019 for consultative assistance with management of her diabetes.  DIABETIC HISTORY:  Valerie Schmitt was diagnosed with T2DM in 2016. She has been on Glimepiride in the past without reported intolerance.She moved from [place] 06/2018 and records were not available.  Her hemoglobin A1c was 12.9% on initial visit.  On her initial visit to our clinic, her A1c was 12.9%. She was already on MDI regimen , metformin and Ozempic which were continued.   SUBJECTIVE:   During the last visit (04/30/2021): A1c 9.1 % . We increased Ozempic and farxiga , adjusted MDI regimen and continued  Metformin      Today (09/03/2021): [name] is here for a follow up on diabetes management.  She checks her blood sugars occasionally preprandial . The patient has  had hypoglycemic episodes since the last clinic visit, she is symptomatic with these episodes    Daughter moved out in [place]  which gave her time to focus on self  Denies nausea, vomiting or diarrhea  Scheduled for colonoscopy 09/19/2021     HOME DIABETES REGIMEN:  Metformin 1000 mg BID Farxiga 10 mg daily  Ozempic 2 mg weekly  Basaglar 40 units daily Humalog  12  units with Brunch and 14 units with Supper  Correction scale : Humalog (BG-130/20)     Statin: yes ACE-I/ARB: no    CONTINUOUS GLUCOSE MONITORING RECORD INTERPRETATION    Dates of Recording: 6/8-6/21/2023  Sensor description:dexcom  Results statistics:   CGM use % of time 93  Average and SD 106/25  Time in range   96    %  % Time Above 180 1  % Time  above 250 0  % Time Below target 2      Glycemic patterns summary: optimal BG's through the night and day   Hyperglycemic episodes  post supper   Hypoglycemic episodes occurred after breakfast (brunch)   Overnight periods: optimal      DIABETIC COMPLICATIONS: Microvascular complications:   Denies: CKD, neuropathy, retinopathy  Last Eye Exam: Completed 05/2021  Macrovascular complications:  Denies: CAD, CVA, PVD   HISTORY:  Past Medical History:  Past Medical History:  Diagnosis Date   Allergy    Anxiety    Arthritis    Asthma    Depression    Diabetes mellitus without complication ([place])    Ketoacidosis 03/2016   Sleep apnea    Thyroid disease    Past Surgical History:  Past Surgical History:  Procedure Laterality Date   BREAST SURGERY     HERNIA REPAIR     REDUCTION MAMMAPLASTY Bilateral 2003   TUBAL LIGATION     Social History:  reports that she has never smoked. She has never used smokeless tobacco. She reports that she does not currently use alcohol. She reports that she does not use drugs. Family History:  Family History  Problem Relation Age of Onset   Diabetes Father    Scoliosis Daughter    Autoimmune disease Daughter    Sleep apnea Daughter    Anxiety disorder Daughter    Depression Daughter  Personality disorder Daughter    Osteoporosis Maternal Grandmother    Alzheimer's disease Paternal Grandmother    Liver disease Paternal Grandfather      HOME MEDICATIONS: Allergies as of 09/03/2021   No Known Allergies      Medication List        Accurate as of September 03, 2021 10:00 AM. If you have any questions, ask your nurse or doctor.          atorvastatin 80 MG tablet Commonly known as: LIPITOR Take 1 tablet (80 mg total) by mouth every evening.   Basaglar KwikPen 100 UNIT/ML Inject 40 Units into the skin daily.   dapagliflozin propanediol 10 MG Tabs tablet Commonly known as: Farxiga Take 1 tablet (10 mg total) by mouth daily  before breakfast.   Dexcom G6 Sensor Misc 1 Device by Does not apply route as directed.   Dexcom G6 Transmitter Misc 1 Device by Does not apply route as directed.   DULoxetine 60 MG capsule Commonly known as: CYMBALTA Take 1 capsule (60 mg total) by mouth daily.   HumaLOG KwikPen 200 UNIT/ML KwikPen Generic drug: insulin lispro Inject 14 Units into the skin with breakfast, with lunch, and with evening meal. Max daily 75 units with correction scale   icosapent Ethyl 1 g capsule Commonly known as: VASCEPA Take 2 g by mouth 2 (two) times daily.   Insulin Pen Needle 31G X 5 MM Misc 1 Device by Does not apply route in the morning, at noon, in the evening, and at bedtime.   levothyroxine 88 MCG tablet Commonly known as: SYNTHROID TAKE 1 TABLET BY MOUTH EVERY DAY BEFORE BREAKFAST   metFORMIN 1000 MG tablet Commonly known as: GLUCOPHAGE TAKE 1 TABLET (1,000 MG TOTAL) BY MOUTH 2 (TWO) TIMES DAILY WITH A MEAL.   OneTouch UltraLink w/Device Kit by Does not apply route 3 (three) times daily.   Ozempic (2 MG/DOSE) 8 MG/3ML Sopn Generic drug: Semaglutide (2 MG/DOSE) Inject 2 mg into the skin once a week.   rizatriptan 10 MG disintegrating tablet Commonly known as: MAXALT-MLT Take 1 tablet (10 mg total) by mouth as needed for migraine. May repeat in 2 hours if needed   traZODone 100 MG tablet Commonly known as: DESYREL Take 2 tablets (200 mg total) by mouth at bedtime as needed for sleep.         OBJECTIVE:   Vital Signs: BP 110/60 (BP Location: Left Arm, Patient Position: Sitting, Cuff Size: Small)   Pulse 94   Ht 6' (1.829 m)   Wt 244 lb (110.7 kg)   SpO2 98%   BMI 33.09 kg/m   Wt Readings from Last 3 Encounters:  09/03/21 244 lb (110.7 kg)  08/26/21 246 lb (111.6 kg)  08/06/21 242 lb 9.6 oz (110 kg)     Exam: General: Pt appears well and is in NAD  Lungs: Clear with good BS bilat with no rales, rhonchi, or wheezes  Heart: RRR with normal S1 and S2 and no  gallops; no murmurs; no rub  Abdomen: Normoactive bowel sounds, soft, nontender, without masses or organomegaly palpable  Extremities: No pretibial edema.   Neuro: MS is good with appropriate affect, pt is alert and Ox3      DM foot exam: 09/03/2021   The skin of the feet is intact  The pedal pulses are 2+ on right and 2+ on left. The sensation is intact to a screening 5.07, 10 gram monofilament bilaterally    DATA REVIEWED:  Lab  Results  Component Value Date   HGBA1C 5.9 (A) 09/03/2021   HGBA1C 9.1 (A) 04/30/2021   HGBA1C 10.8 (A) 12/25/2020    Latest Reference Range & Units 08/06/21 10:34  Sodium 134 - 144 mmol/L 141  Potassium 3.5 - 5.2 mmol/L 4.8  Chloride 96 - 106 mmol/L 100  CO2 20 - 29 mmol/L 23  Glucose 70 - 99 mg/dL 131 (H)  BUN 6 - 24 mg/dL 17  Creatinine 0.57 - 1.00 mg/dL 0.73  Calcium 8.7 - 10.2 mg/dL 10.0  BUN/Creatinine Ratio 9 - 23  23  eGFR >59 mL/min/1.73 98  Alkaline Phosphatase 44 - 121 IU/L 69  Albumin 3.8 - 4.9 g/dL 4.8  Albumin/Globulin Ratio 1.2 - 2.2  1.8  AST 0 - 40 IU/L 17  ALT 0 - 32 IU/L 18  Total Protein 6.0 - 8.5 g/dL 7.4  Total Bilirubin 0.0 - 1.2 mg/dL 0.6  Total CHOL/HDL Ratio 0.0 - 4.4 ratio 5.0 (H)  Cholesterol, Total 100 - 199 mg/dL 204 (H)  HDL Cholesterol >39 mg/dL 41  Triglycerides 0 - 149 mg/dL 131  VLDL Cholesterol Cal 5 - 40 mg/dL 24  LDL Chol Calc (NIH) 0 - 99 mg/dL 139 (H)    Latest Reference Range & Units 08/06/21 10:34  TSH 0.450 - 4.500 uIU/mL 1.960  T4,Free(Direct) 0.82 - 1.77 ng/dL 1.38     ASSESSMENT / PLAN / RECOMMENDATIONS:   1) Type 2 Diabetes Mellitus, Optimally  controlled, Without complications - Most recent A1c of 5.9%. Goal A1c < 7.0 %.     - praised the pt on improved glycemic control but is having hypoglycemic episodes, will gradually taper down on her insulin as below  - She was advised that if she starts having hypoglycemia during the day, to reduce prandial dose of insulin by 50 %      MEDICATIONS: - Continue Metformin 1000 mg TWice a day  - Continue Ozempic 2 mg weekly  - Decrease Basaglar  30 units ONCE a day  -Change Humalog 10 units with brunch and 12 units with supper -Continue Farxiga  10 mg daily  -Correction scale : Humalog (BG-130/20)   EDUCATION / INSTRUCTIONS: BG monitoring instructions: Patient is instructed to check her blood sugars 3 times a day, before each meal . Call [facility] if: BG persistently < 70  I reviewed the Rule of 15 for the treatment of hypoglycemia in detail with the patient. Literature supplied.     2) Diabetic complications:  Eye: Does not have known diabetic retinopathy.  Neuro/ Feet: Does not have known diabetic peripheral neuropathy .  Renal: Patient does not have known baseline CKD. She   is not on an ACEI/ARB at present.   3) Dyslipidemia :   - Tg at goal but her LDL remains at goal  - She is not on the Vascepa anynore and not needed due to normal Tg  - She is on maximum dose of Atorvastatin, I have suggested switching to maximum dose Rosuvastatin and if LDL reminas elevated would recommend adding Zetia vs PCSK9 I  Medication  Stop atorvastatin 80 mg  Start Rosuvastatin 40 mg daily     F/U in 6 months    Signed electronically by: Mack Guise, MD  [facility]  [facility] [address] [place], [place] [address] Phone: [phone] FAX: [phone]   CC: Emelia Loron [address] [place] [address] Phone: [phone]  Fax: [phone]  Return to  Endocrinology clinic as below: Future Appointments  Date Time Provider [place]  09/17/2021  4:40 PM [place] MM [facility]-1 [facility]  02/10/2022  4:00 PM Drubel, Ria Comment, PA-C [id number]

## 2021-09-05 ENCOUNTER — Other Ambulatory Visit: Payer: Self-pay | Admitting: Physician Assistant

## 2021-09-05 DIAGNOSIS — G43109 Migraine with aura, not intractable, without status migrainosus: Secondary | ICD-10-CM

## 2021-09-13 ENCOUNTER — Other Ambulatory Visit: Payer: Self-pay | Admitting: Internal Medicine

## 2021-09-17 ENCOUNTER — Ambulatory Visit
Admission: RE | Admit: 2021-09-17 | Discharge: 2021-09-17 | Disposition: A | Discharge status: Home / Self Care | Payer: Managed Care, Other (non HMO) | Source: Ambulatory Visit | Attending: Physician Assistant | Admitting: Physician Assistant

## 2021-09-17 DIAGNOSIS — Z1231 Encounter for screening mammogram for malignant neoplasm of breast: Secondary | ICD-10-CM | POA: Diagnosis present

## 2021-09-19 ENCOUNTER — Ambulatory Visit
Admission: RE | Admit: 2021-09-19 | Discharge: 2021-09-19 | Disposition: A | Discharge status: Home / Self Care | Payer: Managed Care, Other (non HMO) | Attending: Gastroenterology | Admitting: Gastroenterology

## 2021-09-19 ENCOUNTER — Encounter: Payer: Self-pay | Admitting: Gastroenterology

## 2021-09-19 ENCOUNTER — Ambulatory Visit: Payer: Managed Care, Other (non HMO) | Admitting: Anesthesiology

## 2021-09-19 ENCOUNTER — Encounter
Admission: RE | Disposition: A | Discharge status: Home / Self Care | Payer: Self-pay | Source: Home / Self Care | Attending: Gastroenterology

## 2021-09-19 DIAGNOSIS — E109 Type 1 diabetes mellitus without complications: Secondary | ICD-10-CM | POA: Insufficient documentation

## 2021-09-19 DIAGNOSIS — D126 Benign neoplasm of colon, unspecified: Secondary | ICD-10-CM

## 2021-09-19 DIAGNOSIS — E039 Hypothyroidism, unspecified: Secondary | ICD-10-CM | POA: Insufficient documentation

## 2021-09-19 DIAGNOSIS — Z1211 Encounter for screening for malignant neoplasm of colon: Secondary | ICD-10-CM | POA: Diagnosis present

## 2021-09-19 DIAGNOSIS — E669 Obesity, unspecified: Secondary | ICD-10-CM | POA: Insufficient documentation

## 2021-09-19 DIAGNOSIS — Z794 Long term (current) use of insulin: Secondary | ICD-10-CM | POA: Diagnosis not present

## 2021-09-19 DIAGNOSIS — G4730 Sleep apnea, unspecified: Secondary | ICD-10-CM | POA: Diagnosis not present

## 2021-09-19 DIAGNOSIS — Z6831 Body mass index (BMI) 31.0-31.9, adult: Secondary | ICD-10-CM | POA: Diagnosis not present

## 2021-09-19 DIAGNOSIS — D120 Benign neoplasm of cecum: Secondary | ICD-10-CM | POA: Insufficient documentation

## 2021-09-19 DIAGNOSIS — J45909 Unspecified asthma, uncomplicated: Secondary | ICD-10-CM | POA: Diagnosis not present

## 2021-09-19 DIAGNOSIS — D122 Benign neoplasm of ascending colon: Secondary | ICD-10-CM | POA: Diagnosis not present

## 2021-09-19 DIAGNOSIS — Z86010 Personal history of colonic polyps: Secondary | ICD-10-CM | POA: Diagnosis not present

## 2021-09-19 HISTORY — PX: COLONOSCOPY WITH PROPOFOL: SHX5780

## 2021-09-19 LAB — GLUCOSE, CAPILLARY: Glucose-Capillary: 136 mg/dL — ABNORMAL HIGH (ref 70–99)

## 2021-09-19 SURGERY — COLONOSCOPY WITH PROPOFOL
Anesthesia: General

## 2021-09-19 MED ORDER — PROPOFOL 10 MG/ML IV BOLUS
INTRAVENOUS | Status: DC | PRN
  Administered 2021-09-19: 80 mg via INTRAVENOUS
  Administered 2021-09-19: 50 mg via INTRAVENOUS

## 2021-09-19 MED ORDER — SODIUM CHLORIDE 0.9 % IV SOLN: INTRAVENOUS | Status: DC

## 2021-09-19 MED ORDER — PROPOFOL 500 MG/50ML IV EMUL
INTRAVENOUS | Status: DC | PRN
  Administered 2021-09-19: 160 ug/kg/min via INTRAVENOUS

## 2021-09-19 MED ORDER — PROPOFOL 1000 MG/100ML IV EMUL
INTRAVENOUS | Status: AC
  Filled 2021-09-19: qty 100

## 2021-09-19 MED ORDER — PROPOFOL 10 MG/ML IV BOLUS
INTRAVENOUS | Status: AC
  Filled 2021-09-19: qty 20

## 2021-09-19 MED ORDER — PHENYLEPHRINE 80 MCG/ML (10ML) SYRINGE FOR IV PUSH (FOR BLOOD PRESSURE SUPPORT)
PREFILLED_SYRINGE | INTRAVENOUS | Status: AC
  Filled 2021-09-19: qty 10

## 2021-09-19 NOTE — Anesthesia Preprocedure Evaluation (Signed)
Anesthesia Evaluation  Patient identified by MRN, date of birth, ID band Patient awake    Reviewed: Allergy & Precautions, NPO status , Patient's Chart, lab work & pertinent test results  Airway Mallampati: II  TM Distance: >3 FB Neck ROM: Full    Dental  (+) Teeth Intact   Pulmonary neg pulmonary ROS, sleep apnea ,    Pulmonary exam normal breath sounds clear to auscultation       Cardiovascular Exercise Tolerance: Good negative cardio ROS Normal cardiovascular exam Rhythm:Regular     Neuro/Psych  Headaches, Anxiety Depression negative neurological ROS  negative psych ROS   GI/Hepatic negative GI ROS, Neg liver ROS,   Endo/Other  negative endocrine ROSdiabetes, Well Controlled, Type 1, Insulin DependentHypothyroidism   Renal/GU negative Renal ROS  negative genitourinary   Musculoskeletal  (+) Arthritis ,   Abdominal (+) + obese,   Peds negative pediatric ROS (+)  Hematology negative hematology ROS (+)   Anesthesia Other Findings Past Medical History: No date: Allergy No date: Anxiety No date: Arthritis No date: Asthma No date: Depression No date: Diabetes mellitus without complication ([place]) 31/5176: Ketoacidosis No date: Sleep apnea No date: Thyroid disease  Past Surgical History: No date: BREAST SURGERY No date: HERNIA REPAIR 2003: REDUCTION MAMMAPLASTY; Bilateral No date: TUBAL LIGATION  BMI    Body Mass Index: 31.87 kg/m      Reproductive/Obstetrics negative OB ROS                             Anesthesia Physical Anesthesia Plan  ASA: 2  Anesthesia Plan: General   Post-op Pain Management:    Induction: Intravenous  PONV Risk Score and Plan: Propofol infusion and TIVA  Airway Management Planned: Natural Airway  Additional Equipment:   Intra-op Plan:   Post-operative Plan:   Informed Consent: I have reviewed the patients History and Physical, chart, labs  and discussed the procedure including the risks, benefits and alternatives for the proposed anesthesia with the patient or authorized representative who has indicated his/her understanding and acceptance.     Dental Advisory Given  Plan Discussed with: CRNA and Surgeon  Anesthesia Plan Comments:         Anesthesia Quick Evaluation

## 2021-09-19 NOTE — Transfer of Care (Signed)
Immediate Anesthesia Transfer of Care Note  Patient: Valerie Schmitt  Procedure(s) Performed: COLONOSCOPY WITH PROPOFOL  Patient Location: PACU and Endoscopy Unit  Anesthesia Type:General  Level of Consciousness: drowsy and patient cooperative  Airway & Oxygen Therapy: Patient Spontanous Breathing  Post-op Assessment: Report given to RN and Post -op Vital signs reviewed and stable  Post vital signs: Reviewed and stable  Last Vitals:  Vitals Value Taken Time  BP 88/62 09/19/21 0949  Temp 36.2 C 09/19/21 0949  Pulse 86 09/19/21 0949  Resp 12 09/19/21 0949  SpO2 95 % 09/19/21 0949    Last Pain:  Vitals:   09/19/21 0949  TempSrc: Temporal  PainSc: Asleep         Complications: No notable events documented.

## 2021-09-19 NOTE — H&P (Signed)
Jonathon Bellows, MD [address] [place], [place], [place], [address] [address], [place], [place], [place], [address] Phone: [phone]  Fax: [phone]  Primary Care Physician:  [name], PA-C   Pre-Procedure History & Physical: HPI:  Valerie Schmitt is a 56 y.o. female is here for an colonoscopy.   Past Medical History:  Diagnosis Date   Allergy    Anxiety    Arthritis    Asthma    Depression    Diabetes mellitus without complication ([place])    Ketoacidosis 03/2016   Sleep apnea    Thyroid disease     Past Surgical History:  Procedure Laterality Date   BREAST SURGERY     HERNIA REPAIR     REDUCTION MAMMAPLASTY Bilateral 2003   TUBAL LIGATION      Prior to Admission medications   Medication Sig Start Date End Date Taking? Authorizing Provider  DULoxetine (CYMBALTA) 60 MG capsule Take 1 capsule (60 mg total) by mouth daily. 08/06/21  Yes [name], PA-C  insulin lispro (HUMALOG KWIKPEN) 200 UNIT/ML KwikPen Max daily 30 units with correction scale 09/03/21  Yes Shamleffer, Melanie Crazier, MD  levothyroxine (SYNTHROID) 88 MCG tablet TAKE 1 TABLET BY MOUTH EVERY DAY BEFORE BREAKFAST 06/25/21  Yes Drubel, Ria Comment, PA-C  metFORMIN (GLUCOPHAGE) 1000 MG tablet TAKE 1 TABLET (1,000 MG TOTAL) BY MOUTH 2 (TWO) TIMES DAILY WITH A MEAL. 08/18/21  Yes Shamleffer, Melanie Crazier, MD  rizatriptan (MAXALT-MLT) 10 MG disintegrating tablet TAKE 1 TABLET BY MOUTH AS NEEDED FOR MIGRAINE. MAY REPEAT IN 2 HOURS IF NEEDED 09/05/21  Yes Drubel, Ria Comment, PA-C  rosuvastatin (CRESTOR) 40 MG tablet Take 1 tablet (40 mg total) by mouth daily. 09/03/21  Yes Shamleffer, Melanie Crazier, MD  Semaglutide, 2 MG/DOSE, (OZEMPIC, 2 MG/DOSE,) 8 MG/3ML SOPN Inject 2 mg into the skin once a week. 09/03/21  Yes Shamleffer, Melanie Crazier, MD  Blood Glucose Monitoring Suppl (ONETOUCH ULTRALINK) w/Device KIT by Does not apply route 3 (three) times daily.    [provider]  Continuous  Blood Gluc Sensor (DEXCOM G6 SENSOR) MISC 1 DEVICE BY DOES NOT APPLY ROUTE AS DIRECTED 09/15/21   Shamleffer, Melanie Crazier, MD  Continuous Blood Gluc Transmit (DEXCOM G6 TRANSMITTER) MISC 1 Device by Does not apply route as directed. 07/03/20   Shamleffer, Melanie Crazier, MD  dapagliflozin propanediol (FARXIGA) 10 MG TABS tablet Take 1 tablet (10 mg total) by mouth daily before breakfast. 09/03/21   Shamleffer, Melanie Crazier, MD  Insulin Glargine (BASAGLAR KWIKPEN) 100 UNIT/ML Inject 30 Units into the skin daily. 09/03/21   Shamleffer, Melanie Crazier, MD  Insulin Pen Needle 31G X 5 MM MISC 1 Device by Does not apply route in the morning, at noon, in the evening, and at bedtime. 09/03/21   Shamleffer, Melanie Crazier, MD  traZODone (DESYREL) 100 MG tablet Take 2 tablets (200 mg total) by mouth at bedtime as needed for sleep. 08/06/21   [name], PA-C    Allergies as of 08/18/2021   (No Known Allergies)    Family History  Problem Relation Age of Onset   Diabetes Father    Scoliosis Daughter    Autoimmune disease Daughter    Sleep apnea Daughter    Anxiety disorder Daughter    Depression Daughter    Personality disorder Daughter    Osteoporosis Maternal Grandmother    Alzheimer's disease Paternal Grandmother    Liver disease Paternal Grandfather    Breast cancer Neg Hx     Social  History   Socioeconomic History   Marital status: Divorced    Spouse name: Not on file   Number of children: Not on file   Years of education: Not on file   Highest education level: Not on file  Occupational History   Not on file  Tobacco Use   Smoking status: Never   Smokeless tobacco: Never  Substance and Sexual Activity   Alcohol use: Not Currently   Drug use: Never   Sexual activity: Not Currently  Other Topics Concern   Not on file  Social History Narrative   Not on file   Social Determinants of Health   Financial Resource Strain: Not on file  Food Insecurity: Not on file   Transportation Needs: Not on file  Physical Activity: Not on file  Stress: Not on file  Social Connections: Not on file  Intimate Partner Violence: Not on file    Review of Systems: See HPI, otherwise negative ROS  Physical Exam: BP 111/81   Pulse 95   Temp (!) 96.9 F (36.1 C) (Temporal)   Resp 16   Ht 6' (1.829 m)   Wt 106.6 kg   SpO2 99%   BMI 31.87 kg/m  General:   Alert,  pleasant and cooperative in NAD Head:  Normocephalic and atraumatic. Neck:  Supple; no masses or thyromegaly. Lungs:  Clear throughout to auscultation, normal respiratory effort.    Heart:  +S1, +S2, Regular rate and rhythm, No edema. Abdomen:  Soft, nontender and nondistended. Normal bowel sounds, without guarding, and without rebound.   Neurologic:  Alert and  oriented x4;  grossly normal neurologically.  Impression/Plan: Valerie Schmitt is here for an colonoscopy to be performed for surveillance due to prior history of colon polyps   Risks, benefits, limitations, and alternatives regarding  colonoscopy have been reviewed with the patient.  Questions have been answered.  All parties agreeable.   Jonathon Bellows, MD  09/19/2021, 9:08 AM

## 2021-09-19 NOTE — Anesthesia Postprocedure Evaluation (Signed)
Anesthesia Post Note  Patient: Valerie Schmitt  Procedure(s) Performed: COLONOSCOPY WITH PROPOFOL  Patient location during evaluation: PACU Anesthesia Type: General Level of consciousness: awake and awake and alert Pain management: satisfactory to patient Vital Signs Assessment: post-procedure vital signs reviewed and stable Respiratory status: spontaneous breathing Cardiovascular status: stable Anesthetic complications: no   No notable events documented.   Last Vitals:  Vitals:   09/19/21 0959 09/19/21 1008  BP: 103/71 112/78  Pulse: 83 80  Resp: 16 16  Temp:    SpO2: 97% 97%    Last Pain:  Vitals:   09/19/21 1008  TempSrc:   PainSc: 0-No pain                 VAN STAVEREN,Tiegan Terpstra

## 2021-09-19 NOTE — Op Note (Signed)
[facility] Gastroenterology Patient Name: Valerie Schmitt Procedure Date: 09/19/2021 9:06 AM MRN: [id number] Account #: [id number] Date of Birth: [birth year] Admit Type: Outpatient Age: 56 Room: [facility] ENDO ROOM 4 Gender: Female Note Status: Finalized Instrument Name: Jasper Riling [id number] Procedure:             Colonoscopy Indications:           Surveillance: Personal history of adenomatous polyps                         on last colonoscopy > 3 years ago Providers:             Jonathon Bellows MD, MD Medicines:             Monitored Anesthesia Care Complications:         No immediate complications. Procedure:             Pre-Anesthesia Assessment:                        - Prior to the procedure, a History and Physical was                         performed, and patient medications, allergies and                         sensitivities were reviewed. The patient's tolerance                         of previous anesthesia was reviewed.                        - The risks and benefits of the procedure and the                         sedation options and risks were discussed with the                         patient. All questions were answered and informed                         consent was obtained.                        - ASA Grade Assessment: II - A patient with mild                         systemic disease.                        After obtaining informed consent, the colonoscope was                         passed under direct vision. Throughout the procedure,                         the patient's blood pressure, pulse, and oxygen                         saturations were monitored continuously. The  Colonoscope was introduced through the anus and                         advanced to the the cecum, identified by the                         appendiceal orifice. The colonoscopy was performed                         with ease. The patient tolerated the  procedure well.                         The quality of the bowel preparation was good. Findings:      The perianal and digital rectal examinations were normal.      Three sessile polyps were found in the ascending colon. The polyps were       4 to 5 mm in size. These polyps were removed with a cold snare.       Resection and retrieval were complete.      A 4 mm polyp was found in the cecum. The polyp was sessile. The polyp       was removed with a cold biopsy forceps. Resection and retrieval were       complete.      The exam was otherwise without abnormality on direct and retroflexion       views. Impression:            - Three 4 to 5 mm polyps in the ascending colon,                         removed with a cold snare. Resected and retrieved.                        - One 4 mm polyp in the cecum, removed with a cold                         biopsy forceps. Resected and retrieved.                        - The examination was otherwise normal on direct and                         retroflexion views. Recommendation:        - Discharge patient to home (with escort).                        - Resume previous diet.                        - Continue present medications.                        - Await pathology results.                        - Repeat colonoscopy for surveillance based on                         pathology results. Procedure Code(s):     --- Professional ---  45385, Colonoscopy, flexible; with removal of                         tumor(s), polyp(s), or other lesion(s) by snare                         technique                        45380, 6, Colonoscopy, flexible; with biopsy, single                         or multiple Diagnosis Code(s):     --- Professional ---                        Z86.010, Personal history of colonic polyps                        K63.5, Polyp of colon CPT copyright 2019 American Medical Association. All rights reserved. The codes  documented in this report are preliminary and upon coder review may  be revised to meet current compliance requirements. Jonathon Bellows, MD Jonathon Bellows MD, MD 09/19/2021 9:48:43 AM This report has been signed electronically. Number of Addenda: 0 Note Initiated On: 09/19/2021 9:06 AM Scope Withdrawal Time: 0 hours 22 minutes 8 seconds  Total Procedure Duration: 0 hours 27 minutes 24 seconds  Estimated Blood Loss:  Estimated blood loss: none.      [facility]

## 2021-09-22 LAB — SURGICAL PATHOLOGY

## 2021-09-23 ENCOUNTER — Encounter: Payer: Self-pay | Admitting: Gastroenterology

## 2021-10-07 ENCOUNTER — Other Ambulatory Visit: Payer: Self-pay | Admitting: Physician Assistant

## 2021-10-07 DIAGNOSIS — G43109 Migraine with aura, not intractable, without status migrainosus: Secondary | ICD-10-CM

## 2021-10-13 ENCOUNTER — Other Ambulatory Visit: Payer: Self-pay | Admitting: Internal Medicine

## 2021-11-02 ENCOUNTER — Other Ambulatory Visit: Payer: Self-pay | Admitting: Physician Assistant

## 2021-11-02 DIAGNOSIS — G43109 Migraine with aura, not intractable, without status migrainosus: Secondary | ICD-10-CM

## 2022-01-12 ENCOUNTER — Encounter (INDEPENDENT_AMBULATORY_CARE_PROVIDER_SITE_OTHER): Payer: Self-pay

## 2022-02-02 ENCOUNTER — Other Ambulatory Visit: Payer: Self-pay | Admitting: Physician Assistant

## 2022-02-02 DIAGNOSIS — F5101 Primary insomnia: Secondary | ICD-10-CM

## 2022-02-02 DIAGNOSIS — F339 Major depressive disorder, recurrent, unspecified: Secondary | ICD-10-CM

## 2022-02-10 ENCOUNTER — Encounter: Payer: Self-pay | Admitting: Physician Assistant

## 2022-02-10 ENCOUNTER — Ambulatory Visit: Payer: Managed Care, Other (non HMO) | Admitting: Physician Assistant

## 2022-02-10 VITALS — BP 132/79 | HR 87 | Temp 97.9°F | Wt 235.0 lb

## 2022-02-10 DIAGNOSIS — F339 Major depressive disorder, recurrent, unspecified: Secondary | ICD-10-CM | POA: Diagnosis not present

## 2022-02-10 DIAGNOSIS — E1165 Type 2 diabetes mellitus with hyperglycemia: Secondary | ICD-10-CM | POA: Diagnosis not present

## 2022-02-10 DIAGNOSIS — H9313 Tinnitus, bilateral: Secondary | ICD-10-CM | POA: Insufficient documentation

## 2022-02-10 DIAGNOSIS — D582 Other hemoglobinopathies: Secondary | ICD-10-CM

## 2022-02-10 DIAGNOSIS — E1169 Type 2 diabetes mellitus with other specified complication: Secondary | ICD-10-CM

## 2022-02-10 DIAGNOSIS — F5101 Primary insomnia: Secondary | ICD-10-CM

## 2022-02-10 DIAGNOSIS — E785 Hyperlipidemia, unspecified: Secondary | ICD-10-CM

## 2022-02-10 DIAGNOSIS — Z794 Long term (current) use of insulin: Secondary | ICD-10-CM

## 2022-02-10 NOTE — Assessment & Plan Note (Signed)
F/b endocrinology Has not completed uacr-- ordered.  On statin  Foot exam utd and optho utd per pt Declines vaccines.

## 2022-02-10 NOTE — Assessment & Plan Note (Signed)
Well controlled with trazodone nightly . continue

## 2022-02-10 NOTE — Assessment & Plan Note (Signed)
Referred to ENT

## 2022-02-10 NOTE — Assessment & Plan Note (Addendum)
Pt was switched by endo to rosuvastatin 40 mg.  Will check fasting lipids/cmp If elevated consider zetia / repatha

## 2022-02-10 NOTE — Assessment & Plan Note (Signed)
Saw on last cbc, will repeat labs / cmp .

## 2022-02-10 NOTE — Progress Notes (Signed)
Established patient visit   Patient: Valerie Schmitt   DOB: 03/09/1966   56 y.o. Female  MRN: [id number] Visit Date: 02/10/2022  Today's healthcare provider: [name], PA-C   I,Meilyn Heindl,acting as a scribe for [name], PA-C.,have documented all relevant documentation on the behalf of [name], PA-C,as directed by  [name], PA-C while in the presence of [name], PA-C.   Cc. Chronic care follow up   Subjective    Valerie Schmitt, age 56, here for 6 month check up and persistent ringing in her ears for the last 6 months.   Patient has no further concerns.    Pt reports ringing started shortly after our last appointment, sounds like high pitched mosquitos, constantly. She thinks it may be affecting her hearing. Denies nasal congestion, post nasal drip, ear pain.   Medications: Outpatient Medications Prior to Visit  Medication Sig   Blood Glucose Monitoring Suppl (ONETOUCH ULTRALINK) w/Device KIT by Does not apply route 3 (three) times daily.   Continuous Blood Gluc Sensor (DEXCOM G6 SENSOR) MISC 1 DEVICE BY DOES NOT APPLY ROUTE AS DIRECTED   Continuous Blood Gluc Transmit (DEXCOM G6 TRANSMITTER) MISC 1 DEVICE BY DOES NOT APPLY ROUTE AS DIRECTED   dapagliflozin propanediol (FARXIGA) 10 MG TABS tablet Take 1 tablet (10 mg total) by mouth daily before breakfast.   DULoxetine (CYMBALTA) 60 MG capsule TAKE 1 CAPSULE BY MOUTH EVERY DAY   Insulin Glargine (BASAGLAR KWIKPEN) 100 UNIT/ML Inject 30 Units into the skin daily.   insulin lispro (HUMALOG KWIKPEN) 200 UNIT/ML KwikPen Max daily 30 units with correction scale   Insulin Pen Needle 31G X 5 MM MISC 1 Device by Does not apply route in the morning, at noon, in the evening, and at bedtime.   levothyroxine (SYNTHROID) 88 MCG tablet TAKE 1 TABLET BY MOUTH EVERY DAY BEFORE BREAKFAST   metFORMIN (GLUCOPHAGE) 1000 MG tablet TAKE 1 TABLET (1,000 MG TOTAL) BY MOUTH 2 (TWO) TIMES DAILY WITH A  MEAL.   rizatriptan (MAXALT-MLT) 10 MG disintegrating tablet TAKE 1 TABLET BY MOUTH AS NEEDED FOR MIGRAINE. MAY REPEAT IN 2 HOURS IF NEEDED   rosuvastatin (CRESTOR) 40 MG tablet Take 1 tablet (40 mg total) by mouth daily.   Semaglutide, 2 MG/DOSE, (OZEMPIC, 2 MG/DOSE,) 8 MG/3ML SOPN Inject 2 mg into the skin once a week.   traZODone (DESYREL) 100 MG tablet TAKE 2 TABLETS (200 MG TOTAL) BY MOUTH AT BEDTIME AS NEEDED FOR SLEEP   No facility-administered medications prior to visit.    Review of Systems  HENT:  Positive for tinnitus.   All other systems reviewed and are negative.    Objective    Blood pressure 132/79, pulse 87, temperature 97.9 F (36.6 C), temperature source Oral, weight 235 lb (106.6 kg), SpO2 100 %.   Physical Exam Constitutional:      General: She is awake.     Appearance: She is well-developed.  HENT:     Head: Normocephalic.     Right Ear: Tympanic membrane and ear canal normal.     Left Ear: Tympanic membrane and ear canal normal.  Eyes:     Conjunctiva/sclera: Conjunctivae normal.  Cardiovascular:     Rate and Rhythm: Normal rate and regular rhythm.     Heart sounds: Normal heart sounds.  Pulmonary:     Effort: Pulmonary effort is normal.     Breath sounds: Normal breath sounds.  Skin:    General: Skin is warm.  Neurological:     Mental Status: She is alert and oriented to person, place, and time.  Psychiatric:        Attention and Perception: Attention normal.        Mood and Affect: Mood normal.        Speech: Speech normal.        Behavior: Behavior is cooperative.     No results found for any visits on 02/10/22.  Assessment & Plan     Problem List Items Addressed This Visit       Endocrine   Type 2 diabetes mellitus with hyperglycemia, with long-term current use of insulin ([place]) - Primary    F/b endocrinology Has not completed uacr-- ordered.  On statin  Foot exam utd and optho utd per pt Declines vaccines.       Relevant Orders    Urine Microalbumin w/creat. ratio   Hyperlipidemia associated with type 2 diabetes mellitus ([place])    Pt was switched by endo to rosuvastatin 40 mg.  Will check fasting lipids/cmp If elevated consider zetia / repatha       Relevant Orders   Lipid Profile   Comprehensive Metabolic Panel (CMET)     Other   Depression, recurrent ([place])    Well controlled w/ cymbalta. continue      Elevated hemoglobin ([facility])    Saw on last cbc, will repeat labs / cmp .       Relevant Orders   CBC w/Diff/Platelet   Primary insomnia    Well controlled with trazodone nightly . continue      Tinnitus of both ears    Referred to ENT      Relevant Orders   Ambulatory referral to ENT   Return in about 6 months (around 08/11/2022) for CPE.      I, [name], PA-C have reviewed all documentation for this visit. The documentation on  02/10/2022 for the exam, diagnosis, procedures, and orders are all accurate and complete.  [name], PA-C [facility] [address] [place], [place], [address] Office: [phone] Fax: [place]

## 2022-02-10 NOTE — Assessment & Plan Note (Signed)
Well controlled w/ cymbalta. continue

## 2022-02-16 ENCOUNTER — Other Ambulatory Visit: Payer: Self-pay | Admitting: Internal Medicine

## 2022-03-05 ENCOUNTER — Ambulatory Visit: Payer: Managed Care, Other (non HMO) | Admitting: Internal Medicine

## 2022-03-06 ENCOUNTER — Other Ambulatory Visit: Payer: Self-pay | Admitting: Otolaryngology

## 2022-03-06 DIAGNOSIS — H9312 Tinnitus, left ear: Secondary | ICD-10-CM

## 2022-03-17 ENCOUNTER — Encounter: Payer: Self-pay | Admitting: Internal Medicine

## 2022-03-17 ENCOUNTER — Ambulatory Visit: Payer: Managed Care, Other (non HMO) | Admitting: Internal Medicine

## 2022-03-17 VITALS — BP 118/76 | HR 83 | Ht 72.0 in | Wt 236.0 lb

## 2022-03-17 DIAGNOSIS — E782 Mixed hyperlipidemia: Secondary | ICD-10-CM

## 2022-03-17 DIAGNOSIS — E119 Type 2 diabetes mellitus without complications: Secondary | ICD-10-CM | POA: Diagnosis not present

## 2022-03-17 DIAGNOSIS — Z794 Long term (current) use of insulin: Secondary | ICD-10-CM

## 2022-03-17 LAB — POCT GLYCOSYLATED HEMOGLOBIN (HGB A1C): Hemoglobin A1C: 6.4 % — AB (ref 4.0–5.6)

## 2022-03-17 MED ORDER — METFORMIN HCL 1000 MG PO TABS: 1000.0000 mg | ORAL_TABLET | Freq: Two times a day (BID) | ORAL | 3 refills | Status: DC

## 2022-03-17 MED ORDER — BASAGLAR KWIKPEN 100 UNIT/ML ~~LOC~~ SOPN: 30.0000 [IU] | PEN_INJECTOR | Freq: Every day | SUBCUTANEOUS | 3 refills | Status: DC

## 2022-03-17 MED ORDER — ROSUVASTATIN CALCIUM 40 MG PO TABS: 40.0000 mg | ORAL_TABLET | Freq: Every day | ORAL | 3 refills | Status: DC

## 2022-03-17 MED ORDER — HUMALOG KWIKPEN 200 UNIT/ML ~~LOC~~ SOPN
PEN_INJECTOR | SUBCUTANEOUS | 3 refills | Status: DC
Start: 2022-03-17 — End: 2022-09-22

## 2022-03-17 MED ORDER — DAPAGLIFLOZIN PROPANEDIOL 10 MG PO TABS: 10.0000 mg | ORAL_TABLET | Freq: Every day | ORAL | 3 refills | Status: DC

## 2022-03-17 MED ORDER — OZEMPIC (2 MG/DOSE) 8 MG/3ML ~~LOC~~ SOPN: 2.0000 mg | PEN_INJECTOR | SUBCUTANEOUS | 3 refills | Status: DC

## 2022-03-17 MED ORDER — INSULIN PEN NEEDLE 31G X 5 MM MISC: 1.0000 | Freq: Four times a day (QID) | 3 refills | Status: DC

## 2022-03-17 NOTE — Patient Instructions (Addendum)
-   Continue Metformin 1000 mg twice daily  - Continue Ozempic 2 mg weekly  - Continue Farxiga 10 mg daily  - Continue  Basaglar  30 units daily  - Take   Humalog 5 units with Brunch and 6 with Supper ( if needed )  - Humalog correctional insulin: ADD extra units on insulin to your meal-time Humalog dose if your blood sugars are higher than 150. Use the scale below to help guide you:   Blood sugar before meal Number of units to inject  Less than 150 0 unit  151 -  170 1 units  171 -  190 2 units  191 -  210 3 units  211 -  230 4 units  231 -  250 5 units  251 -  270 6 units  271 -  290 7 units  291 -  310 8 units  311 - 330 9 units         HOW TO TREAT LOW BLOOD SUGARS (Blood sugar LESS THAN 70 MG/DL) Please follow the RULE OF 15 for the treatment of hypoglycemia treatment (when your (blood sugars are less than 70 mg/dL)   STEP 1: Take 15 grams of carbohydrates when your blood sugar is low, which includes:  3-4 GLUCOSE TABS  OR 3-4 OZ OF JUICE OR REGULAR SODA OR ONE TUBE OF GLUCOSE GEL    STEP 2: RECHECK blood sugar in 15 MINUTES STEP 3: If your blood sugar is still low at the 15 minute recheck --> then, go back to STEP 1 and treat AGAIN with another 15 grams of carbohydrates.

## 2022-03-17 NOTE — Progress Notes (Signed)
Name: Valerie Schmitt  Age/ Sex: 57 y.o., female   MRN/ DOB: [id number], Jul 10, 1965     PCP: [name], PA-C   Reason for Endocrinology Evaluation: Type 2 Diabetes Mellitus  Initial Endocrine Consultative Visit: 04/26/2019    PATIENT IDENTIFIER: Valerie Schmitt is a 57 y.o. female with a past medical history of T2DM and Dyslipidemia. The patient has followed with Endocrinology clinic since 04/26/2019 for consultative assistance with management of her diabetes.  DIABETIC HISTORY:  Valerie Schmitt was diagnosed with T2DM in 2016. She has been on Glimepiride in the past without reported intolerance.She moved from [place] 06/2018 and records were not available.  Her hemoglobin A1c was 12.9% on initial visit.  On her initial visit to our clinic, her A1c was 12.9%. She was already on MDI regimen , metformin and Ozempic which were continued.   SUBJECTIVE:   During the last visit (09/03/2021): A1c 5.9 %     Today (03/17/2022): Valerie Schmitt is here for a follow up on diabetes management.  She checks her blood sugars occasionally preprandial . The patient has  had hypoglycemic episodes since the last clinic visit, she is symptomatic with these episodes   Denies nausea, vomiting or diarrhea  Denies side to rosuvastatin  She is not needing to use humalog consistently     HOME DIABETES REGIMEN:  Metformin 1000 mg BID Farxiga 10 mg daily  Ozempic 2 mg weekly  Basaglar 30 units daily Humalog  5  units with Brunch and 6 units with Supper  Correction scale : Humalog (BG-130/20)  Rosuvastatin 40 mg daily       Statin: yes ACE-I/ARB: no    CONTINUOUS GLUCOSE MONITORING RECORD INTERPRETATION    Dates of Recording:12/17-12/30/2023  Sensor description:dexcom  Results statistics:   CGM use % of time 100  Average and SD 135/33  Time in range   91    %  % Time Above 180 8  % Time above 250 <1  % Time Below target <1      Glycemic patterns summary: optimal BG's  through the night and most of the day   Hyperglycemic episodes  post supper   Hypoglycemic episodes occurred after breakfast (brunch)   Overnight periods: optimal      DIABETIC COMPLICATIONS: Microvascular complications:   Denies: CKD, neuropathy, retinopathy  Last Eye Exam: Completed 03/2021  Macrovascular complications:  Denies: CAD, CVA, PVD   HISTORY:  Past Medical History:  Past Medical History:  Diagnosis Date   Allergy    Anxiety    Arthritis    Asthma    Depression    Diabetes mellitus without complication ([place])    Ketoacidosis 03/2016   Sleep apnea    Thyroid disease    Past Surgical History:  Past Surgical History:  Procedure Laterality Date   BREAST SURGERY     COLONOSCOPY WITH PROPOFOL N/A 09/19/2021   Procedure: COLONOSCOPY WITH PROPOFOL;  Surgeon: Jonathon Bellows, MD;  Location: [facility] ENDOSCOPY;  Service: Gastroenterology;  Laterality: N/A;  DM   HERNIA REPAIR     REDUCTION MAMMAPLASTY Bilateral 2003   TUBAL LIGATION     Social History:  reports that she has never smoked. She has never used smokeless tobacco. She reports that she does not currently use alcohol. She reports that she does not use drugs. Family History:  Family History  Problem Relation Age of Onset   Diabetes Father    Scoliosis Daughter    Autoimmune disease Daughter  Sleep apnea Daughter    Anxiety disorder Daughter    Depression Daughter    Personality disorder Daughter    Osteoporosis Maternal Grandmother    Alzheimer's disease Paternal Grandmother    Liver disease Paternal Grandfather    Breast cancer Neg Hx      HOME MEDICATIONS: Allergies as of 03/17/2022   No Known Allergies      Medication List        Accurate as of March 17, 2022 10:20 AM. If you have any questions, ask your nurse or doctor.          Basaglar KwikPen 100 UNIT/ML Inject 30 Units into the skin daily.   dapagliflozin propanediol 10 MG Tabs tablet Commonly known as: Farxiga Take 1 tablet  (10 mg total) by mouth daily before breakfast.   Dexcom G6 Sensor Misc 1 DEVICE BY DOES NOT APPLY ROUTE AS DIRECTED   Dexcom G6 Transmitter Misc 1 DEVICE BY DOES NOT APPLY ROUTE AS DIRECTED   DULoxetine 60 MG capsule Commonly known as: CYMBALTA TAKE 1 CAPSULE BY MOUTH EVERY DAY   HumaLOG KwikPen 200 UNIT/ML KwikPen Generic drug: insulin lispro Max daily 30 units with correction scale   Insulin Pen Needle 31G X 5 MM Misc 1 Device by Does not apply route in the morning, at noon, in the evening, and at bedtime.   levothyroxine 88 MCG tablet Commonly known as: SYNTHROID TAKE 1 TABLET BY MOUTH EVERY DAY BEFORE BREAKFAST   metFORMIN 1000 MG tablet Commonly known as: GLUCOPHAGE TAKE 1 TABLET (1,000 MG TOTAL) BY MOUTH TWICE A DAY WITH FOOD   OneTouch UltraLink w/Device Kit by Does not apply route 3 (three) times daily.   Ozempic (2 MG/DOSE) 8 MG/3ML Sopn Generic drug: Semaglutide (2 MG/DOSE) Inject 2 mg into the skin once a week.   rizatriptan 10 MG disintegrating tablet Commonly known as: MAXALT-MLT TAKE 1 TABLET BY MOUTH AS NEEDED FOR MIGRAINE. MAY REPEAT IN 2 HOURS IF NEEDED   rosuvastatin 40 MG tablet Commonly known as: CRESTOR Take 1 tablet (40 mg total) by mouth daily.   traZODone 100 MG tablet Commonly known as: DESYREL TAKE 2 TABLETS (200 MG TOTAL) BY MOUTH AT BEDTIME AS NEEDED FOR SLEEP         OBJECTIVE:   Vital Signs: BP 118/76 (BP Location: Left Arm, Patient Position: Sitting, Cuff Size: Large)   Pulse 83   Ht 6' (1.829 m)   Wt 236 lb (107 kg)   SpO2 96%   BMI 32.01 kg/m   Wt Readings from Last 3 Encounters:  03/17/22 236 lb (107 kg)  02/10/22 235 lb (106.6 kg)  09/19/21 235 lb (106.6 kg)     Exam: General: Pt appears well and is in NAD  Lungs: Clear with good BS bilat with no rales, rhonchi, or wheezes  Heart: RRR with normal S1 and S2 and no gallops; no murmurs; no rub  Abdomen: Normoactive bowel sounds, soft, nontender, without masses or  organomegaly palpable  Extremities: No pretibial edema.   Neuro: MS is good with appropriate affect, pt is alert and Ox3      DM foot exam: 09/03/2021   The skin of the feet is intact  The pedal pulses are 2+ on right and 2+ on left. The sensation is intact to a screening 5.07, 10 gram monofilament bilaterally    DATA REVIEWED:  Lab Results  Component Value Date   HGBA1C 5.9 (A) 09/03/2021   HGBA1C 9.1 (A) 04/30/2021   HGBA1C  10.8 (A) 12/25/2020    Latest Reference Range & Units 08/06/21 10:34  Sodium 134 - 144 mmol/L 141  Potassium 3.5 - 5.2 mmol/L 4.8  Chloride 96 - 106 mmol/L 100  CO2 20 - 29 mmol/L 23  Glucose 70 - 99 mg/dL 131 (H)  BUN 6 - 24 mg/dL 17  Creatinine 0.57 - 1.00 mg/dL 0.73  Calcium 8.7 - 10.2 mg/dL 10.0  BUN/Creatinine Ratio 9 - 23  23  eGFR >59 mL/min/1.73 98  Alkaline Phosphatase 44 - 121 IU/L 69  Albumin 3.8 - 4.9 g/dL 4.8  Albumin/Globulin Ratio 1.2 - 2.2  1.8  AST 0 - 40 IU/L 17  ALT 0 - 32 IU/L 18  Total Protein 6.0 - 8.5 g/dL 7.4  Total Bilirubin 0.0 - 1.2 mg/dL 0.6  Total CHOL/HDL Ratio 0.0 - 4.4 ratio 5.0 (H)  Cholesterol, Total 100 - 199 mg/dL 204 (H)  HDL Cholesterol >39 mg/dL 41  Triglycerides 0 - 149 mg/dL 131  VLDL Cholesterol Cal 5 - 40 mg/dL 24  LDL Chol Calc (NIH) 0 - 99 mg/dL 139 (H)    Latest Reference Range & Units 08/06/21 10:34  TSH 0.450 - 4.500 uIU/mL 1.960  T4,Free(Direct) 0.82 - 1.77 ng/dL 1.38     ASSESSMENT / PLAN / RECOMMENDATIONS:   1) Type 2 Diabetes Mellitus, Optimally  controlled, Without complications - Most recent A1c of 6.4 %. Goal A1c < 7.0 %.    - A1c remains optimal - She is not not needing to use humalog on regular basis , she would take ~ 6-7 units when she knows she will eat a larger meal than usual  - She will continue to use correction scale before each meal  - NO other changes     MEDICATIONS: - Continue Metformin 1000 mg TWice a day  - Continue Farxiga 10 mg daily  - Continue Ozempic 2 mg  weekly  - Continue  Basaglar  30 units ONCE a day  - Continue  Humalog 5 units with brunch and 6 units with supper with large meals -Continue Correction scale : Humalog (BG-130/20)   EDUCATION / INSTRUCTIONS: BG monitoring instructions: Patient is instructed to check her blood sugars 3 times a day, before each meal . Call [facility] if: BG persistently < 70  I reviewed the Rule of 15 for the treatment of hypoglycemia in detail with the patient. Literature supplied.     2) Diabetic complications:  Eye: Does not have known diabetic retinopathy.  Neuro/ Feet: Does not have known diabetic peripheral neuropathy .  Renal: Patient does not have known baseline CKD. She   is not on an ACEI/ARB at present.   3) Dyslipidemia :    - She is not on the Vascepa anynore and not needed due to normalization of  Tg  - She was on maximum dose of Atorvastatin, I had switching to maximum dose Rosuvastatin and if LDL reminas elevated would recommend adding Zetia vs PCSK9 inhibitors  - She will have labs today at PCP's office     Medication   Continue Rosuvastatin 40 mg daily     F/U in 6 months    Signed electronically by: Mack Guise, MD  [facility]  [facility] [address] [place], [place] [address] Phone: [phone] FAX: [phone]   CC: Emelia Loron [address] [place] [address] Phone: [phone]  Fax: [phone]  Return to Endocrinology clinic as below: No future appointments.

## 2022-03-18 ENCOUNTER — Other Ambulatory Visit: Payer: Self-pay | Admitting: Physician Assistant

## 2022-03-18 DIAGNOSIS — D582 Other hemoglobinopathies: Secondary | ICD-10-CM

## 2022-03-18 LAB — COMPREHENSIVE METABOLIC PANEL
ALT: 23 IU/L (ref 0–32)
AST: 21 IU/L (ref 0–40)
Albumin/Globulin Ratio: 2 (ref 1.2–2.2)
Albumin: 4.9 g/dL (ref 3.8–4.9)
Alkaline Phosphatase: 70 IU/L (ref 44–121)
BUN/Creatinine Ratio: 16 (ref 9–23)
BUN: 14 mg/dL (ref 6–24)
Bilirubin Total: 0.6 mg/dL (ref 0.0–1.2)
CO2: 23 mmol/L (ref 20–29)
Calcium: 9.6 mg/dL (ref 8.7–10.2)
Chloride: 101 mmol/L (ref 96–106)
Creatinine, Ser: 0.9 mg/dL (ref 0.57–1.00)
Globulin, Total: 2.4 g/dL (ref 1.5–4.5)
Glucose: 120 mg/dL — ABNORMAL HIGH (ref 70–99)
Potassium: 4.6 mmol/L (ref 3.5–5.2)
Sodium: 141 mmol/L (ref 134–144)
Total Protein: 7.3 g/dL (ref 6.0–8.5)
eGFR: 75 mL/min/{1.73_m2} (ref >59)

## 2022-03-18 LAB — CBC WITH DIFFERENTIAL/PLATELET
Basophils Absolute: 0.1 10*3/uL (ref 0.0–0.2)
Basos: 1 %
EOS (ABSOLUTE): 0.5 10*3/uL — ABNORMAL HIGH (ref 0.0–0.4)
Eos: 5 %
Hematocrit: 49.4 % — ABNORMAL HIGH (ref 34.0–46.6)
Hemoglobin: 16.5 g/dL — ABNORMAL HIGH (ref 11.1–15.9)
Immature Grans (Abs): 0 10*3/uL (ref 0.0–0.1)
Immature Granulocytes: 0 %
Lymphocytes Absolute: 2.2 10*3/uL (ref 0.7–3.1)
Lymphs: 26 %
MCH: 29.5 pg (ref 26.6–33.0)
MCHC: 33.4 g/dL (ref 31.5–35.7)
MCV: 88 fL (ref 79–97)
Monocytes Absolute: 0.5 10*3/uL (ref 0.1–0.9)
Monocytes: 6 %
Neutrophils Absolute: 5.3 10*3/uL (ref 1.4–7.0)
Neutrophils: 62 %
Platelets: 257 10*3/uL (ref 150–450)
RBC: 5.59 x10E6/uL — ABNORMAL HIGH (ref 3.77–5.28)
RDW: 12.8 % (ref 11.7–15.4)
WBC: 8.7 10*3/uL (ref 3.4–10.8)

## 2022-03-18 LAB — MICROALBUMIN / CREATININE URINE RATIO
Creatinine, Urine: 107.5 mg/dL
Microalb/Creat Ratio: 201 mg/g creat — ABNORMAL HIGH (ref 0–29)
Microalbumin, Urine: 216.1 ug/mL

## 2022-03-18 LAB — LIPID PANEL
Chol/HDL Ratio: 2.7 ratio (ref 0.0–4.4)
Cholesterol, Total: 130 mg/dL (ref 100–199)
HDL: 49 mg/dL (ref >39)
LDL Chol Calc (NIH): 56 mg/dL (ref 0–99)
Triglycerides: 147 mg/dL (ref 0–149)
VLDL Cholesterol Cal: 25 mg/dL (ref 5–40)

## 2022-03-24 ENCOUNTER — Inpatient Hospital Stay: Payer: Managed Care, Other (non HMO) | Attending: Internal Medicine | Admitting: Internal Medicine

## 2022-03-24 ENCOUNTER — Encounter: Payer: Self-pay | Admitting: Internal Medicine

## 2022-03-24 ENCOUNTER — Inpatient Hospital Stay: Payer: Managed Care, Other (non HMO)

## 2022-03-24 VITALS — BP 109/74 | HR 87 | Temp 98.6°F | Resp 20 | Wt 240.1 lb

## 2022-03-24 DIAGNOSIS — F32A Depression, unspecified: Secondary | ICD-10-CM | POA: Insufficient documentation

## 2022-03-24 DIAGNOSIS — D582 Other hemoglobinopathies: Secondary | ICD-10-CM | POA: Insufficient documentation

## 2022-03-24 DIAGNOSIS — J449 Chronic obstructive pulmonary disease, unspecified: Secondary | ICD-10-CM | POA: Diagnosis not present

## 2022-03-24 DIAGNOSIS — E039 Hypothyroidism, unspecified: Secondary | ICD-10-CM | POA: Diagnosis not present

## 2022-03-24 DIAGNOSIS — D751 Secondary polycythemia: Secondary | ICD-10-CM | POA: Diagnosis not present

## 2022-03-24 DIAGNOSIS — F419 Anxiety disorder, unspecified: Secondary | ICD-10-CM | POA: Insufficient documentation

## 2022-03-24 DIAGNOSIS — R519 Headache, unspecified: Secondary | ICD-10-CM | POA: Diagnosis not present

## 2022-03-24 DIAGNOSIS — Z79899 Other long term (current) drug therapy: Secondary | ICD-10-CM | POA: Insufficient documentation

## 2022-03-24 DIAGNOSIS — M1990 Unspecified osteoarthritis, unspecified site: Secondary | ICD-10-CM | POA: Diagnosis not present

## 2022-03-24 DIAGNOSIS — Z7984 Long term (current) use of oral hypoglycemic drugs: Secondary | ICD-10-CM | POA: Diagnosis not present

## 2022-03-24 DIAGNOSIS — E119 Type 2 diabetes mellitus without complications: Secondary | ICD-10-CM | POA: Insufficient documentation

## 2022-03-24 DIAGNOSIS — G4733 Obstructive sleep apnea (adult) (pediatric): Secondary | ICD-10-CM | POA: Diagnosis not present

## 2022-03-24 DIAGNOSIS — Z87891 Personal history of nicotine dependence: Secondary | ICD-10-CM | POA: Insufficient documentation

## 2022-03-24 DIAGNOSIS — H9319 Tinnitus, unspecified ear: Secondary | ICD-10-CM | POA: Insufficient documentation

## 2022-03-24 DIAGNOSIS — E785 Hyperlipidemia, unspecified: Secondary | ICD-10-CM | POA: Diagnosis not present

## 2022-03-24 DIAGNOSIS — R2689 Other abnormalities of gait and mobility: Secondary | ICD-10-CM | POA: Insufficient documentation

## 2022-03-24 LAB — CBC WITH DIFFERENTIAL/PLATELET
Abs Immature Granulocytes: 0.01 10*3/uL (ref 0.00–0.07)
Basophils Absolute: 0.1 10*3/uL (ref 0.0–0.1)
Basophils Relative: 1 %
Eosinophils Absolute: 0.4 10*3/uL (ref 0.0–0.5)
Eosinophils Relative: 5 %
HCT: 46 % (ref 36.0–46.0)
Hemoglobin: 15.3 g/dL — ABNORMAL HIGH (ref 12.0–15.0)
Immature Granulocytes: 0 %
Lymphocytes Relative: 23 %
Lymphs Abs: 1.8 10*3/uL (ref 0.7–4.0)
MCH: 29.3 pg (ref 26.0–34.0)
MCHC: 33.3 g/dL (ref 30.0–36.0)
MCV: 88 fL (ref 80.0–100.0)
Monocytes Absolute: 0.5 10*3/uL (ref 0.1–1.0)
Monocytes Relative: 7 %
Neutro Abs: 5.3 10*3/uL (ref 1.7–7.7)
Neutrophils Relative %: 64 %
Platelets: 231 10*3/uL (ref 150–400)
RBC: 5.23 MIL/uL — ABNORMAL HIGH (ref 3.87–5.11)
RDW: 12.9 % (ref 11.5–15.5)
WBC: 8.1 10*3/uL (ref 4.0–10.5)
nRBC: 0 % (ref 0.0–0.2)

## 2022-03-24 NOTE — Progress Notes (Signed)
[facility]  [phone] [phone]  ID: Valerie Schmitt OB: Sep 07, 1965  MR#: [id number]  QIO#:[id number]  Patient Care Team: [name], Hershal Coria as PCP - General (Physician Assistant) Anell Barr, OD (Optometry)  REFERRING PROVIDER: [name], PA-C  REASON FOR REFERRAL: Polycythemia  HPI: Valerie Schmitt is a 57 y.o. female with past medical history of diabetes, sleep apnea, anxiety was referred to hematology for polycythemia.  Patient has elevated hemoglobin since 2021.  We do not have blood work prior to that since she was in [place].  She reports feeling well.  Denies any unintentional weight loss, hot flashes, dizziness, blurry vision, tingling or numbness in hands or feet.  She is a remote smoker quit many years ago.  She has history of OSA and was using CPAP machine but last used it in 2005.  After that she had intentional weight loss and denied any further snoring.  She is also having increased frequency of headaches with tinnitus causing balance problems.  She is following with ENT and MRI brain has been scheduled.  Patient reports that when she had audiometric test time she was hearing okay at low-frequency however with high frequencies she had difficulty.  Patient is also on dapagliflozin for diabetes.    REVIEW OF SYSTEMS:   ROS  As per HPI. Otherwise, a complete review of systems is negative.  PAST MEDICAL HISTORY: Past Medical History:  Diagnosis Date   Allergy    Anxiety    Arthritis    Asthma    Depression    Diabetes mellitus without complication ([place])    Ketoacidosis 03/2016   Sleep apnea    Thyroid disease     PAST SURGICAL HISTORY: Past Surgical History:  Procedure Laterality Date   BREAST SURGERY     COLONOSCOPY WITH PROPOFOL N/A 09/19/2021   Procedure: COLONOSCOPY WITH PROPOFOL;  Surgeon: Jonathon Bellows, MD;  Location: [facility] ENDOSCOPY;  Service: Gastroenterology;  Laterality: N/A;  DM   HERNIA  REPAIR     REDUCTION MAMMAPLASTY Bilateral 2003   TUBAL LIGATION      FAMILY HISTORY: Family History  Problem Relation Age of Onset   Diabetes Father    Scoliosis Daughter    Autoimmune disease Daughter    Sleep apnea Daughter    Anxiety disorder Daughter    Depression Daughter    Personality disorder Daughter    Osteoporosis Maternal Grandmother    Alzheimer's disease Paternal Grandmother    Liver disease Paternal Grandfather    Breast cancer Neg Hx     HEALTH MAINTENANCE: Social History   Tobacco Use   Smoking status: Never   Smokeless tobacco: Never  Substance Use Topics   Alcohol use: Not Currently   Drug use: Never     No Known Allergies  Current Outpatient Medications  Medication Sig Dispense Refill   Blood Glucose Monitoring Suppl (ONETOUCH ULTRALINK) w/Device KIT by Does not apply route 3 (three) times daily.     Continuous Blood Gluc Sensor (DEXCOM G6 SENSOR) MISC 1 DEVICE BY DOES NOT APPLY ROUTE AS DIRECTED 9 each 3   Continuous Blood Gluc Transmit (DEXCOM G6 TRANSMITTER) MISC 1 DEVICE BY DOES NOT APPLY ROUTE AS DIRECTED 1 each 11   dapagliflozin propanediol (FARXIGA) 10 MG TABS tablet Take 1 tablet (10 mg total) by mouth daily before breakfast. 90 tablet 3   DULoxetine (CYMBALTA) 60 MG capsule TAKE 1 CAPSULE BY MOUTH EVERY DAY 90 capsule 1   Insulin Glargine (BASAGLAR KWIKPEN)  100 UNIT/ML Inject 30 Units into the skin daily. 30 mL 3   insulin lispro (HUMALOG KWIKPEN) 200 UNIT/ML KwikPen Max daily 30 units with correction scale 30 mL 3   Insulin Pen Needle 31G X 5 MM MISC 1 Device by Does not apply route in the morning, at noon, in the evening, and at bedtime. 400 each 3   levothyroxine (SYNTHROID) 88 MCG tablet TAKE 1 TABLET BY MOUTH EVERY DAY BEFORE BREAKFAST 90 tablet 0   metFORMIN (GLUCOPHAGE) 1000 MG tablet Take 1 tablet (1,000 mg total) by mouth 2 (two) times daily with a meal. 180 tablet 3   rizatriptan (MAXALT-MLT) 10 MG disintegrating tablet TAKE 1  TABLET BY MOUTH AS NEEDED FOR MIGRAINE. MAY REPEAT IN 2 HOURS IF NEEDED 10 tablet 3   rosuvastatin (CRESTOR) 40 MG tablet Take 1 tablet (40 mg total) by mouth daily. 90 tablet 3   Semaglutide, 2 MG/DOSE, (OZEMPIC, 2 MG/DOSE,) 8 MG/3ML SOPN Inject 2 mg into the skin once a week. 9 mL 3   traZODone (DESYREL) 100 MG tablet TAKE 2 TABLETS (200 MG TOTAL) BY MOUTH AT BEDTIME AS NEEDED FOR SLEEP 180 tablet 1   No current facility-administered medications for this visit.    OBJECTIVE: Vitals:   03/24/22 1112  BP: 109/74  Pulse: 87  Resp: 20  Temp: 98.6 F (37 C)  SpO2: 100%     Body mass index is 32.56 kg/m.      General: Well-developed, well-nourished, no acute distress. Eyes: Pink conjunctiva, anicteric sclera. HEENT: Normocephalic, moist mucous membranes, clear oropharnyx. Lungs: Clear to auscultation bilaterally. Heart: Regular rate and rhythm. No rubs, murmurs, or gallops. Abdomen: Soft, nontender, nondistended. No organomegaly noted, normoactive bowel sounds. Musculoskeletal: No edema, cyanosis, or clubbing. Neuro: Alert, answering all questions appropriately. Cranial nerves grossly intact. Skin: No rashes or petechiae noted. Psych: Normal affect. Lymphatics: No cervical, calvicular, axillary or inguinal LAD.   LAB RESULTS:  Lab Results  Component Value Date   NA 141 03/17/2022   K 4.6 03/17/2022   CL 101 03/17/2022   CO2 23 03/17/2022   GLUCOSE 120 (H) 03/17/2022   BUN 14 03/17/2022   CREATININE 0.90 03/17/2022   CALCIUM 9.6 03/17/2022   PROT 7.3 03/17/2022   ALBUMIN 4.9 03/17/2022   AST 21 03/17/2022   ALT 23 03/17/2022   ALKPHOS 70 03/17/2022   BILITOT 0.6 03/17/2022   GFRNONAA 101 07/19/2019   GFRAA 117 07/19/2019    Lab Results  Component Value Date   WBC 8.7 03/17/2022   NEUTROABS 5.3 03/17/2022   HGB 16.5 (H) 03/17/2022   HCT 49.4 (H) 03/17/2022   MCV 88 03/17/2022   PLT 257 03/17/2022    Lab Results  Component Value Date   TIBC 340 07/19/2019    IRONPCTSAT 35 07/19/2019     STUDIES: No results found.  ASSESSMENT AND PLAN:   Valerie Schmitt is a 57 y.o. female with pmh of diabetes, sleep apnea, anxiety was referred to hematology for polycythemia.  # Polycythemia -Unknown etiology.  Present at least since 2021.  Do not have labs prior to that she was in [place].  Most recent hemoglobin 16.5, hematocrit 49.4. -exam was unremarkable. -Will obtain labs as below to rule out myeloproliferative neoplasm versus secondary causes.  Patient denies any smoking.  She is on dapagliflozin which in 5% of the cases can cause polycythemia.  I will also obtain ultrasound of the abdomen to rule out any liver or renal masses. She also has  prior history of OSA and used CPAP back in 2005.  If above workup is unremarkable will consider doing sleep study.  Currently she has been having issues with increasing headache and tinnitus causing balance problem.  She is following with ENT.  MRI brain is scheduled on January 16.  ENT was also looking into doing a sleep study.  # Diabetes -On insulin, dapagliflozin, metformin, Ozempic  # Hyperlipidemia -On Zocor  # Hypothyroidism -On levothyroxine  Orders Placed This Encounter  Procedures   US Abdomen Complete   Erythropoietin   CBC with Differential   JAK2 [id number] rfx CALR/MPL/E12-15   RTC in 2 weeks for MyChart video labs today  Patient expressed understanding and was in agreement with this plan. She also understands that She can call clinic at any time with any questions, concerns, or complaints.   I spent a total of 45 minutes reviewing chart data, face-to-face evaluation with the patient, counseling and coordination of care as detailed above.  Jane Canary, MD   03/24/2022 11:54 AM

## 2022-03-25 LAB — ERYTHROPOIETIN: Erythropoietin: 17.7 m[IU]/mL (ref 2.6–18.5)

## 2022-04-01 ENCOUNTER — Ambulatory Visit
Admission: RE | Admit: 2022-04-01 | Discharge: 2022-04-01 | Disposition: A | Discharge status: Home / Self Care | Payer: Managed Care, Other (non HMO) | Source: Ambulatory Visit | Attending: Otolaryngology | Admitting: Otolaryngology

## 2022-04-01 DIAGNOSIS — H9312 Tinnitus, left ear: Secondary | ICD-10-CM

## 2022-04-01 MED ORDER — GADOPICLENOL 0.5 MMOL/ML IV SOLN
10.0000 mL | Freq: Once | INTRAVENOUS | Status: AC | PRN
  Administered 2022-04-01: 10 mL via INTRAVENOUS

## 2022-04-03 LAB — CALR +MPL + E12-E15  (REFLEX)

## 2022-04-03 LAB — JAK2 V617F RFX CALR/MPL/E12-15

## 2022-04-07 ENCOUNTER — Inpatient Hospital Stay: Payer: Managed Care, Other (non HMO) | Admitting: Internal Medicine

## 2022-04-07 ENCOUNTER — Ambulatory Visit
Admission: RE | Admit: 2022-04-07 | Discharge: 2022-04-07 | Disposition: A | Discharge status: Home / Self Care | Payer: Managed Care, Other (non HMO) | Source: Ambulatory Visit | Attending: Internal Medicine | Admitting: Internal Medicine

## 2022-04-07 ENCOUNTER — Encounter: Payer: Self-pay | Admitting: Internal Medicine

## 2022-04-07 DIAGNOSIS — D582 Other hemoglobinopathies: Secondary | ICD-10-CM

## 2022-04-07 DIAGNOSIS — D751 Secondary polycythemia: Secondary | ICD-10-CM | POA: Diagnosis not present

## 2022-04-07 NOTE — Progress Notes (Unsigned)
Patient scheduled for virtual visit today to review lab results. Patient reports she had abdominal ultrasound this morning, results still pending.

## 2022-04-09 NOTE — Progress Notes (Signed)
[place]  Telephone:([phone] [phone]  I connected with Valerie Schmitt on 04/09/22 at  3:30 PM EST by my chart video and verified that I am speaking with the correct person using two identifiers.   I discussed the limitations, risks, security and privacy concerns of performing an evaluation and management service by telemedicine and the availability of in-person appointments. I also discussed with the patient that there may be a patient responsible charge related to this service. The patient expressed understanding and agreed to proceed.   Other persons participating in the visit and their role in the encounter: none   Patient's location: home  Provider's location: clinic   Chief Complaint: discuss labs   ID: Valerie Schmitt OB: [birth year]  MR#: [id number]  CBU#:[id number]  Patient Care Team: [name], PA-C as PCP - General (Physician Assistant) Anell Barr, OD (Optometry)   HPI: Valerie Schmitt is a 57 y.o. female with past medical history of diabetes, sleep apnea, anxiety was referred to hematology for polycythemia.  Patient has elevated hemoglobin since 2021.  We do not have blood work prior to that since she was in [place].  She reports feeling well.  Denies any unintentional weight loss, hot flashes, dizziness, blurry vision, tingling or numbness in hands or feet.  She is a remote smoker quit many years ago.  She has history of OSA and was using CPAP machine but last used it in 2005.  After that she had intentional weight loss and denied any further snoring.  She is also having increased frequency of headaches with tinnitus causing balance problems.  She is following with ENT and MRI brain has been scheduled.  Patient reports that when she had audiometric test time she was hearing okay at low-frequency however with high frequencies she had difficulty.  Patient is also on dapagliflozin for diabetes.  Interval  history- Patient was seen today via MyChart video to discuss labs. She had MRI of the brain for her dizziness and tinnitus.  It showed white matter changes and has been referred to neurologist.  Her dizziness has improved but continues to have tinnitus.  Otherwise denies other concerns.   REVIEW OF SYSTEMS:   ROS  As per HPI. Otherwise, a complete review of systems is negative.  PAST MEDICAL HISTORY: Past Medical History:  Diagnosis Date   Allergy    Anxiety    Arthritis    Asthma    Depression    Diabetes mellitus without complication ([place])    Ketoacidosis 03/2016   Sleep apnea    Thyroid disease     PAST SURGICAL HISTORY: Past Surgical History:  Procedure Laterality Date   BREAST SURGERY     COLONOSCOPY WITH PROPOFOL N/A 09/19/2021   Procedure: COLONOSCOPY WITH PROPOFOL;  Surgeon: Jonathon Bellows, MD;  Location[facility];  Service: Gastroenterology;  Laterality: N/A;  DM   HERNIA REPAIR     REDUCTION MAMMAPLASTY Bilateral 2003   TUBAL LIGATION      FAMILY HISTORY: Family History  Problem Relation Age of Onset   Diabetes Father    Scoliosis Daughter    Autoimmune disease Daughter    Sleep apnea Daughter    Anxiety disorder Daughter    Depression Daughter    Personality disorder Daughter    Osteoporosis Maternal Grandmother    Alzheimer's disease Paternal Grandmother    Liver disease Paternal Grandfather    Breast cancer Neg Hx     HEALTH MAINTENANCE: Social History  Tobacco Use   Smoking status: Never   Smokeless tobacco: Never  Substance Use Topics   Alcohol use: Not Currently   Drug use: Never     No Known Allergies  Current Outpatient Medications  Medication Sig Dispense Refill   Blood Glucose Monitoring Suppl (ONETOUCH ULTRALINK) w/Device KIT by Does not apply route 3 (three) times daily.     Continuous Blood Gluc Sensor (DEXCOM G6 SENSOR) MISC 1 DEVICE BY DOES NOT APPLY ROUTE AS DIRECTED 9 each 3   Continuous Blood Gluc Transmit (DEXCOM G6  TRANSMITTER) MISC 1 DEVICE BY DOES NOT APPLY ROUTE AS DIRECTED 1 each 11   dapagliflozin propanediol (FARXIGA) 10 MG TABS tablet Take 1 tablet (10 mg total) by mouth daily before breakfast. 90 tablet 3   DULoxetine (CYMBALTA) 60 MG capsule TAKE 1 CAPSULE BY MOUTH EVERY DAY 90 capsule 1   Insulin Glargine (BASAGLAR KWIKPEN) 100 UNIT/ML Inject 30 Units into the skin daily. 30 mL 3   insulin lispro (HUMALOG KWIKPEN) 200 UNIT/ML KwikPen Max daily 30 units with correction scale 30 mL 3   Insulin Pen Needle 31G X 5 MM MISC 1 Device by Does not apply route in the morning, at noon, in the evening, and at bedtime. 400 each 3   levothyroxine (SYNTHROID) 88 MCG tablet TAKE 1 TABLET BY MOUTH EVERY DAY BEFORE BREAKFAST 90 tablet 0   metFORMIN (GLUCOPHAGE) 1000 MG tablet Take 1 tablet (1,000 mg total) by mouth 2 (two) times daily with a meal. 180 tablet 3   rizatriptan (MAXALT-MLT) 10 MG disintegrating tablet TAKE 1 TABLET BY MOUTH AS NEEDED FOR MIGRAINE. MAY REPEAT IN 2 HOURS IF NEEDED 10 tablet 3   rosuvastatin (CRESTOR) 40 MG tablet Take 1 tablet (40 mg total) by mouth daily. 90 tablet 3   Semaglutide, 2 MG/DOSE, (OZEMPIC, 2 MG/DOSE,) 8 MG/3ML SOPN Inject 2 mg into the skin once a week. 9 mL 3   traZODone (DESYREL) 100 MG tablet TAKE 2 TABLETS (200 MG TOTAL) BY MOUTH AT BEDTIME AS NEEDED FOR SLEEP 180 tablet 1   No current facility-administered medications for this visit.    OBJECTIVE: There were no vitals filed for this visit.    There is no height or weight on file to calculate BMI.      Physical exam not performed   LAB RESULTS:  Lab Results  Component Value Date   NA 141 03/17/2022   K 4.6 03/17/2022   CL 101 03/17/2022   CO2 23 03/17/2022   GLUCOSE 120 (H) 03/17/2022   BUN 14 03/17/2022   CREATININE 0.90 03/17/2022   CALCIUM 9.6 03/17/2022   PROT 7.3 03/17/2022   ALBUMIN 4.9 03/17/2022   AST 21 03/17/2022   ALT 23 03/17/2022   ALKPHOS 70 03/17/2022   BILITOT 0.6 03/17/2022    GFRNONAA 101 07/19/2019   GFRAA 117 07/19/2019    Lab Results  Component Value Date   WBC 8.1 03/24/2022   NEUTROABS 5.3 03/24/2022   HGB 15.3 (H) 03/24/2022   HCT 46.0 03/24/2022   MCV 88.0 03/24/2022   PLT 231 03/24/2022    Lab Results  Component Value Date   TIBC 340 07/19/2019   IRONPCTSAT 35 07/19/2019     STUDIES: US Abdomen Complete  Result Date: 04/08/2022 CLINICAL DATA:  Polycythemia. EXAM: ABDOMEN ULTRASOUND COMPLETE COMPARISON:  None Available. FINDINGS: Gallbladder: No gallstones or wall thickening visualized. No sonographic Murphy sign noted by sonographer. Common bile duct: Diameter: 2.4 mm Liver: Increased echogenicity. No focal  lesion. Portal vein is patent on color Doppler imaging with normal direction of blood flow towards the liver. IVC: No abnormality visualized. Pancreas: Visualized portion unremarkable. Spleen: Size and appearance within normal limits. Right Kidney: Length: 12.1 cm. Echogenicity within normal limits. No mass or hydronephrosis visualized. Left Kidney: Length: 12.4 cm. Echogenicity within normal limits. No mass or hydronephrosis visualized. Abdominal aorta: No aneurysm visualized. Other findings: None. IMPRESSION: 1. Increased hepatic parenchymal echogenicity suggestive of steatosis. 2. No cholelithiasis or sonographic evidence for acute cholecystitis. Electronically Signed   By: Lovey Newcomer M.D.   On: 04/08/2022 06:43   MR BRAIN/IAC W WO CONTRAST  Result Date: 04/02/2022 CLINICAL DATA:  Left ear tinnitus. EXAM: MRI HEAD WITHOUT AND WITH CONTRAST TECHNIQUE: Multiplanar, multiecho pulse sequences of the brain and surrounding structures were obtained without and with intravenous contrast. COMPARISON:  None Available. FINDINGS: Brain: No acute infarction, hemorrhage, hydrocephalus, extra-axial collection or mass lesion. Scattered foci of T2 hyperintensity are within white matter of the cerebral hemispheres and within the pons, nonspecific. Dedicated  imaging through the internal auditory canals demonstrates a normal course of cranial nerves VII and VIII without evidence of a mass or abnormal enhancement. The inner ear structures demonstrate normal signal and morphology bilaterally. No mass is present in the cerebellopontine angles. Vascular: Normal flow voids. Skull and upper cervical spine: Normal marrow signal. Sinuses/Orbits: Negative. Other: None. IMPRESSION: 1. No mass lesion or abnormal contrast enhancement in the internal auditory canals or cerebellopontine angles. 2. Scattered foci of T2 hyperintensity within the white matter of the cerebral hemispheres and pons, nonspecific. Differential diagnosis include early chronic microangiopathy, demyelinating disease, vasculitis and post inflammatory/infectious process. Electronically Signed   By: Pedro Earls M.D.   On: 04/02/2022 10:29    ASSESSMENT AND PLAN:   Valerie Schmitt is a 57 y.o. female with pmh of diabetes, sleep apnea, anxiety was referred to hematology for polycythemia.  # Polycythemia -Likely secondary.  Differentials include SGLT inhibitor use which in 5% of the cases can cause polycythemia or OSA.   -She has polycythemia present at least since 2021.  EPO level normal.  JAK2 V617F/exon 12/CALR/MPL negative.  Ultrasound abdomen showed hepatic steatosis otherwise no liver or renal masses.  -I discussed with the patient about the potential etiologies for polycythemia.  She is on dapagliflozin for diabetes which sometimes can cause polycythemia.  She will discuss with her endocrinologist if there is alternative option.  Otherwise I discussed that if she really needs to be on the medication we can continue since hemoglobin is only mildly elevated from upper limit of normal.  Also she has prior history of OSA and used CPAP back in 2005.  After that she had intentional weight loss and did not need CPAP.  I would like to repeat a sleep study.  Patient prefers to do that  at home.  I will make a referral to pulmonary.  -No indication for phlebotomy.  # Diabetes -On insulin, dapagliflozin, metformin, Ozempic  # Hyperlipidemia -On Zocor  # Hypothyroidism -On levothyroxine  # Dizziness and tinnitus -MRI brain showed white matter changes.  Patient was referred to neurology.  Orders Placed This Encounter  Procedures   Ambulatory referral to Pulmonology   RTC in 4 months for MD visit, labs  Patient expressed understanding and was in agreement with this plan. She also understands that She can call clinic at any time with any questions, concerns, or complaints.   I spent a total of 30 minutes reviewing  chart data, face-to-face evaluation with the patient, counseling and coordination of care as detailed above.  Jane Canary, MD   04/09/2022 9:02 AM

## 2022-05-04 ENCOUNTER — Telehealth: Payer: Self-pay

## 2022-05-04 NOTE — Telephone Encounter (Signed)
Spoke to patient and confirmed that she does not wear cpap.

## 2022-05-05 ENCOUNTER — Encounter: Payer: Self-pay | Admitting: Internal Medicine

## 2022-05-05 ENCOUNTER — Ambulatory Visit (INDEPENDENT_AMBULATORY_CARE_PROVIDER_SITE_OTHER): Payer: Managed Care, Other (non HMO) | Admitting: Internal Medicine

## 2022-05-05 VITALS — BP 128/74 | HR 87 | Temp 98.1°F | Ht 72.0 in | Wt 247.2 lb

## 2022-05-05 DIAGNOSIS — G4719 Other hypersomnia: Secondary | ICD-10-CM

## 2022-05-05 DIAGNOSIS — G4733 Obstructive sleep apnea (adult) (pediatric): Secondary | ICD-10-CM | POA: Diagnosis not present

## 2022-05-05 NOTE — Patient Instructions (Signed)
Recommend home sleep study to assess for sleep apnea  Recommend weight loss   Avoid secondhand smoke Avoid SICK contacts Recommend  Masking  when appropriate Recommend Keep up-to-date with vaccinations

## 2022-05-05 NOTE — Progress Notes (Signed)
Name: Valerie Schmitt MRN: [id number] DOB: 02-15-1966    CHIEF COMPLAINT:  EXCESSIVE DAYTIME SLEEPINESS   HISTORY OF PRESENT ILLNESS: Patient is seen today for problems and issues with sleep related to excessive daytime sleepiness Patient  has been having sleep problems for many years Patient has been having excessive daytime sleepiness for a long time Patient has been having extreme fatigue and tiredness, lack of energy +  very Loud snoring every night + struggling breathe at night and gasps for air + morning headaches + Nonrefreshing sleep  Patient with a previous diagnosis of sleep apnea 10 years ago  At that time patient weighed approximately 370 pounds and was recently married and did not want to comply with CPAP therapy  Patient currently weighs 247 pounds She does have nonrefreshing sleep and excessive daytime sleepiness She was diagnosed with high hemoglobin and workup for high hemoglobin was sleep apnea which was referred to [place] by oncology  Patient sees ENT for tinnitus Significant workup being performed for this    EPWORTH SLEEP SCORE 4   Patient is a non-smoker patient is nonalcoholic 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  has a past surgical history that includes Breast surgery; Hernia repair; Tubal ligation; Reduction mammaplasty (Bilateral, 2003); and Colonoscopy with propofol (N/A, 09/19/2021). Prior to Admission medications   Medication Sig Start Date End Date Taking? Authorizing Provider  Blood Glucose Monitoring Suppl (ONETOUCH ULTRALINK) w/Device KIT by Does not apply route 3 (three) times daily.    [provider]  Continuous Blood Gluc Sensor (DEXCOM G6 SENSOR) MISC 1 DEVICE BY DOES NOT APPLY ROUTE AS DIRECTED 09/15/21    Shamleffer, Melanie Crazier, MD  Continuous Blood Gluc Transmit (DEXCOM G6 TRANSMITTER) MISC 1 DEVICE BY DOES NOT APPLY ROUTE AS DIRECTED 10/13/21   Shamleffer, Melanie Crazier, MD  dapagliflozin propanediol (FARXIGA) 10 MG TABS tablet Take 1 tablet (10 mg total) by mouth daily before breakfast. 03/17/22   Shamleffer, Melanie Crazier, MD  DULoxetine (CYMBALTA) 60 MG capsule TAKE 1 CAPSULE BY MOUTH EVERY DAY 02/02/22   Drubel, Ria Comment, PA-C  Insulin Glargine (BASAGLAR KWIKPEN) 100 UNIT/ML Inject 30 Units into the skin daily. 03/17/22   Shamleffer, Melanie Crazier, MD  insulin lispro (HUMALOG KWIKPEN) 200 UNIT/ML KwikPen Max daily 30 units with correction scale 03/17/22   Shamleffer, Melanie Crazier, MD  Insulin Pen Needle 31G X 5 MM MISC 1 Device by Does not apply route in the morning, at noon, in the evening, and at bedtime. 03/17/22   Shamleffer, Melanie Crazier, MD  levothyroxine (SYNTHROID) 88 MCG tablet TAKE 1 TABLET BY MOUTH EVERY DAY BEFORE BREAKFAST 06/25/21   Drubel, Ria Comment, PA-C  metFORMIN (GLUCOPHAGE) 1000 MG tablet Take 1 tablet (1,000 mg total) by mouth 2 (two) times daily with a meal. 03/17/22   Shamleffer, Melanie Crazier, MD  rizatriptan (MAXALT-MLT) 10 MG disintegrating tablet TAKE 1 TABLET BY MOUTH AS NEEDED FOR MIGRAINE. MAY REPEAT IN 2 HOURS IF NEEDED 11/03/21   Drubel, Ria Comment, PA-C  rosuvastatin (CRESTOR) 40 MG tablet Take 1 tablet (40 mg total) by mouth daily. 03/17/22   Shamleffer, Melanie Crazier, MD  Semaglutide, 2 MG/DOSE, (OZEMPIC, 2 MG/DOSE,) 8 MG/3ML SOPN Inject 2 mg into the skin once a week. 03/17/22   Shamleffer, Melanie Crazier, MD  traZODone (DESYREL) 100 MG tablet  TAKE 2 TABLETS (200 MG TOTAL) BY MOUTH AT BEDTIME AS NEEDED FOR SLEEP 02/02/22   Drubel, Ria Comment, PA-C   No Known Allergies  FAMILY HISTORY:  family history includes Alzheimer's disease in her paternal grandmother; Anxiety disorder in her daughter; Autoimmune disease in her daughter; Depression in her daughter; Diabetes  in her father; Liver disease in her paternal grandfather; Osteoporosis in her maternal grandmother; Personality disorder in her daughter; Scoliosis in her daughter; Sleep apnea in her daughter. SOCIAL HISTORY:  reports that she has never smoked. She has never used smokeless tobacco. She reports that she does not currently use alcohol. She reports that she does not use drugs.   Review of Systems:  Gen:  Denies  fever, sweats, chills weight loss  HEENT: Denies blurred vision, double vision, ear pain, eye pain, hearing loss, nose bleeds, sore throat Cardiac:  No dizziness, chest pain or heaviness, chest tightness,edema, No JVD Resp:   No cough, -sputum production, -shortness of breath,-wheezing, -hemoptysis,  Gi: Denies swallowing difficulty, stomach pain, nausea or vomiting, diarrhea, constipation, bowel incontinence Gu:  Denies bladder incontinence, burning urine Ext:   Denies Joint pain, stiffness or swelling Skin: Denies  skin rash, easy bruising or bleeding or hives Endoc:  Denies polyuria, polydipsia , polyphagia or weight change Psych:   Denies depression, insomnia or hallucinations  Other:  All other systems negative   ALL OTHER ROS ARE NEGATIVE  BP 128/74 (BP Location: Left Arm, Cuff Size: Normal)   Pulse 87   Temp 98.1 F (36.7 C) (Temporal)   Ht 6' (1.829 m)   Wt 247 lb 3.2 oz (112.1 kg)   SpO2 99%   BMI 33.53 kg/m     Physical Examination:   General Appearance: No distress  EYES PERRLA, EOM intact.   NECK Supple, No JVD Pulmonary: normal breath sounds, No wheezing.  CardiovascularNormal S1,S2.  No m/r/g.   Abdomen: Benign, Soft, non-tender. Skin:   warm, no rashes, no ecchymosis  Extremities: normal, no cyanosis, clubbing. Neuro:without focal findings,  speech normal  PSYCHIATRIC: Mood, affect within normal limits.   ALL OTHER ROS ARE NEGATIVE    ASSESSMENT AND PLAN SYNOPSIS  Patient with signs and symptoms of excessive daytime sleepiness with probable  underlying diagnosis of obstructive sleep apnea in the setting of obesity and deconditioned state   Recommend Sleep Study for definitve diagnosis  Obesity -recommend significant weight loss -recommend changing diet  Deconditioned state -Recommend increased daily activity and exercise   MEDICATION ADJUSTMENTS/LABS AND TESTS ORDERED: Recommend Sleep Study Recommend weight loss   CURRENT MEDICATIONS REVIEWED AT LENGTH WITH PATIENT TODAY   Patient  satisfied with Plan of action and management. All questions answered  Follow up  3 months  Total Time Spent  35 mins   Maretta Bees Patricia Pesa, M.D.  [facility]  Medical Director [place] Director [facility] Cardio-Pulmonary Department

## 2022-05-17 ENCOUNTER — Other Ambulatory Visit: Payer: Self-pay | Admitting: Physician Assistant

## 2022-07-08 ENCOUNTER — Telehealth: Payer: Self-pay | Admitting: Physician Assistant

## 2022-07-08 NOTE — Telephone Encounter (Signed)
Pt called to report that her prescription is supposed to be written for two a day instead of 1. Pt states that she does not have any pills left in her current supply, says she wants enough to last her 3 months twice a day because that is what she has taken for years.  DULoxetine (CYMBALTA) 60 MG capsule   [id number] - [place], [place] - [address]  [address] [place] [address]  Phone: [phone] Fax: [phone]

## 2022-07-16 ENCOUNTER — Telehealth: Payer: Self-pay | Admitting: Pulmonary Disease

## 2022-07-16 DIAGNOSIS — G4733 Obstructive sleep apnea (adult) (pediatric): Secondary | ICD-10-CM | POA: Diagnosis not present

## 2022-07-16 DIAGNOSIS — G4719 Other hypersomnia: Secondary | ICD-10-CM

## 2022-07-16 NOTE — Telephone Encounter (Signed)
Call patient  Sleep study result  Date of study: 07/07/2022  Impression: Moderate obstructive sleep apnea Moderate oxygen desaturations  Recommendation:  DME referral  Recommend CPAP therapy for moderate obstructive sleep apnea  Auto titrating CPAP with pressure settings of 5-15 will be appropriate  Encourage weight loss measures  Follow-up in the office 4 to 6 weeks following initiation of treatment

## 2022-07-17 ENCOUNTER — Other Ambulatory Visit: Payer: Self-pay | Admitting: Physician Assistant

## 2022-07-17 NOTE — Telephone Encounter (Signed)
ATC the patient. LVM for the patient to return my call. 

## 2022-07-20 NOTE — Telephone Encounter (Signed)
ATC again. Left another message for the patient to return my call. I will try one more time due to the nature of the call.

## 2022-07-21 ENCOUNTER — Other Ambulatory Visit: Payer: Self-pay | Admitting: Physician Assistant

## 2022-07-21 DIAGNOSIS — F339 Major depressive disorder, recurrent, unspecified: Secondary | ICD-10-CM

## 2022-07-21 MED ORDER — DULOXETINE HCL 60 MG PO CPEP: 60.0000 mg | ORAL_CAPSULE | Freq: Every day | ORAL | 0 refills | Status: DC

## 2022-07-21 NOTE — Telephone Encounter (Signed)
Requested Prescriptions  Pending Prescriptions Disp Refills   DULoxetine (CYMBALTA) 60 MG capsule 90 capsule 0    Sig: Take 1 capsule (60 mg total) by mouth daily.     Psychiatry: Antidepressants - SNRI - duloxetine Passed - 07/21/2022 10:36 AM      Passed - Cr in normal range and within 360 days    Creatinine, Ser  Date Value Ref Range Status  03/17/2022 0.90 0.57 - 1.00 mg/dL Final   Creatinine,U  Date Value Ref Range Status  07/03/2020 251.8 mg/dL Final         Passed - eGFR is 30 or above and within 360 days    GFR calc Af Amer  Date Value Ref Range Status  07/19/2019 117 >59 mL/min/1.73 Final    Comment:    **[facility] currently reports eGFR in compliance with the current**   recommendations of the [employer]. [facility] will   update reporting as new guidelines are published from the NKF-ASN   Task force.    GFR calc non Af Amer  Date Value Ref Range Status  07/19/2019 101 >59 mL/min/1.73 Final   GFR  Date Value Ref Range Status  12/25/2020 89.12 >60.00 mL/min Final    Comment:    Calculated using the CKD-EPI Creatinine Equation (2021)   eGFR  Date Value Ref Range Status  03/17/2022 75 >59 mL/min/1.73 Final         Passed - Completed PHQ-2 or PHQ-9 in the last 360 days      Passed - Last BP in normal range    BP Readings from Last 1 Encounters:  05/05/22 128/74         Passed - Valid encounter within last 6 months    Recent Outpatient Visits           5 months ago Type 2 diabetes mellitus with hyperglycemia, with long-term current use of insulin [facility])   [place] [facility] [name], PA-C   11 months ago Encounter for health maintenance examination   [facility] [name], PA-C   1 year ago Routine health maintenance   [facility] Chrismon, Jod[place], [place]   2 years ago Insomnia, unspecified type   [facility] Flinchum, Eula Fried,  FNP   2 years ago Depression, recurrent [facility])   [place] [facility] Flinchum, Eula Fried, FNP       Future Appointments             In 3 weeks Drubel, Lou [facility], [facility]

## 2022-07-21 NOTE — Telephone Encounter (Signed)
I have notified the patient and placed the order for the CPAP.  Nothing further needed. 

## 2022-07-21 NOTE — Telephone Encounter (Signed)
Medication Refill - Medication: DULoxetine (CYMBALTA) 60 MG capsule   Has the patient contacted their pharmacy? Yes.     Preferred Pharmacy (with phone number or street name):  [id number] - [place], [place] - [address] Phone: [phone]  Fax: [phone]      Has the patient been seen for an appointment in the last year OR does the patient have an upcoming appointment? Yes.    Please assist patient further

## 2022-08-03 ENCOUNTER — Other Ambulatory Visit: Payer: Self-pay | Admitting: Physician Assistant

## 2022-08-03 DIAGNOSIS — F5101 Primary insomnia: Secondary | ICD-10-CM

## 2022-08-13 NOTE — Progress Notes (Signed)
I,Valerie  Schmitt,acting as a Neurosurgeon for [employer], PA-C.,have documented all relevant documentation on the behalf of Valerie Ferguson, PA-C,as directed by  Valerie Ferguson, PA-C while in the presence of Valerie Ferguson, PA-C.   Complete physical exam   Patient: Valerie Schmitt   DOB: 1966/02/04   57 y.o. Female  MRN: [id number] Visit Date: 08/14/2022  Today's healthcare provider: [name], PA-C   Chief Complaint  Patient presents with   Annual Exam   Subjective    Valerie Schmitt is a 57 y.o. female who presents today for a complete physical exam.  She reports consuming a general diet.  She generally feels well. She reports sleeping poorly. She does not have additional problems to discuss today.  HPI   Discussed the use of AI scribe software for clinical note transcription with the patient, who gave verbal consent to proceed.  History of Present Illness   The patient presents for a general check-up and to discuss various health concerns. She has a history of thick blood, for which she was referred to an oncologist. However, no cause was found for this condition. She also reports a history of tinnitus, which has been worsening, and describes the noise as sounding like cicadas. She has been evaluated by a neurologist and found to have brain lesions and a history of a mini stroke. She was also diagnosed with moderate sleep apnea and recently started using a CPAP machine, which has disrupted her sleep.  The patient also reports a decrease in her dose of Cymbalta (duloxetine) from 120mg  to 60mg , which she believes has increased her anxiety. She has been taking [place] (dapagliflozin) for her kidneys, but stopped temporarily due to concerns from the oncologist that it might be causing her blood to thicken. She has since resumed taking it.  She has a glucose monitor and reports a current reading of 108. She also mentions a concern about potential skin cancer due to a  history of sun exposure and has noticed a line and bumps on her finger, which she believes may be joint fluid.      Past Medical History:  Diagnosis Date   Allergy    Anxiety    Arthritis    Asthma    Depression    Diabetes mellitus without complication (HCC)    Ketoacidosis 03/2016   Sleep apnea    Thyroid disease    Past Surgical History:  Procedure Laterality Date   BREAST SURGERY     COLONOSCOPY WITH PROPOFOL N/A 09/19/2021   Procedure: COLONOSCOPY WITH PROPOFOL;  Surgeon: Wyline Mood, MD;  Location: [facility] ENDOSCOPY;  Service: Gastroenterology;  Laterality: N/A;  DM   HERNIA REPAIR     REDUCTION MAMMAPLASTY Bilateral 2003   TUBAL LIGATION     Social History   Socioeconomic History   Marital status: Divorced    Spouse name: Not on file   Number of children: Not on file   Years of education: Not on file   Highest education level: Not on file  Occupational History   Not on file  Tobacco Use   Smoking status: Never   Smokeless tobacco: Never  Substance and Sexual Activity   Alcohol use: Not Currently   Drug use: Never   Sexual activity: Not Currently    Birth control/protection: None  Other Topics Concern   Not on file  Social History Narrative   Not on file   Social Determinants of Health   Financial Resource Strain: Not on  file  Food Insecurity: Not on file  Transportation Needs: Not on file  Physical Activity: Not on file  Stress: Not on file  Social Connections: Not on file  Intimate Partner Violence: Not on file   Family Status  Relation Name Status   Father  Deceased   Daughter  (Not Specified)   MGM  (Not Specified)   PGM  (Not Specified)   PGF  (Not Specified)   Neg Hx  (Not Specified)   Family History  Problem Relation Age of Onset   Diabetes Father    Scoliosis Daughter    Autoimmune disease Daughter    Sleep apnea Daughter    Anxiety disorder Daughter    Depression Daughter    Personality disorder Daughter    Osteoporosis Maternal  Grandmother    Alzheimer's disease Paternal Grandmother    Liver disease Paternal Grandfather    Breast cancer Neg Hx    No Known Allergies  Patient Care Team: Valerie Ferguson, PA-C as PCP - General (Physician Assistant) Isla Pence, OD (Optometry)   Medications: Outpatient Medications Prior to Visit  Medication Sig   aspirin EC 81 MG tablet Take 81 mg by mouth daily. Swallow whole.   Blood Glucose Monitoring Suppl (ONETOUCH ULTRALINK) w/Device KIT by Does not apply route 3 (three) times daily.   Continuous Blood Gluc Sensor (DEXCOM G6 SENSOR) MISC 1 DEVICE BY DOES NOT APPLY ROUTE AS DIRECTED   Continuous Blood Gluc Transmit (DEXCOM G6 TRANSMITTER) MISC 1 DEVICE BY DOES NOT APPLY ROUTE AS DIRECTED   dapagliflozin propanediol (FARXIGA) 10 MG TABS tablet Take 1 tablet (10 mg total) by mouth daily before breakfast.   Insulin Glargine (BASAGLAR KWIKPEN) 100 UNIT/ML Inject 30 Units into the skin daily.   insulin lispro (HUMALOG KWIKPEN) 200 UNIT/ML KwikPen Max daily 30 units with correction scale   Insulin Pen Needle 31G X 5 MM MISC 1 Device by Does not apply route in the morning, at noon, in the evening, and at bedtime.   levothyroxine (SYNTHROID) 88 MCG tablet TAKE 1 TABLET BY MOUTH EVERY DAY BEFORE BREAKFAST   metFORMIN (GLUCOPHAGE) 1000 MG tablet Take 1 tablet (1,000 mg total) by mouth 2 (two) times daily with a meal.   rizatriptan (MAXALT-MLT) 10 MG disintegrating tablet TAKE 1 TABLET BY MOUTH AS NEEDED FOR MIGRAINE. MAY REPEAT IN 2 HOURS IF NEEDED   rosuvastatin (CRESTOR) 40 MG tablet Take 1 tablet (40 mg total) by mouth daily.   Semaglutide, 2 MG/DOSE, (OZEMPIC, 2 MG/DOSE,) 8 MG/3ML SOPN Inject 2 mg into the skin once a week.   traZODone (DESYREL) 100 MG tablet TAKE 2 TABLETS (200 MG TOTAL) BY MOUTH AT BEDTIME AS NEEDED FOR SLEEP   [DISCONTINUED] DULoxetine (CYMBALTA) 60 MG capsule Take 1 capsule (60 mg total) by mouth daily.   No facility-administered medications prior to visit.     Review of Systems  Constitutional:  Negative for fatigue and fever.  HENT:  Positive for tinnitus.   Respiratory:  Negative for cough and shortness of breath.   Cardiovascular:  Negative for chest pain and leg swelling.  Gastrointestinal:  Negative for abdominal pain.  Neurological:  Negative for dizziness and headaches.     Objective    BP 112/62 (BP Location: Left Arm, Patient Position: Sitting, Cuff Size: Large)   Pulse 79   Temp 97.7 F (36.5 C) (Oral)   Resp 14   Ht 6' (1.829 m)   Wt 241 lb 4.8 oz (109.5 kg)   SpO2 100%  BMI 32.73 kg/m    Physical Exam Constitutional:      General: She is awake.     Appearance: She is well-developed. She is not ill-appearing.  HENT:     Head: Normocephalic.     Right Ear: Tympanic membrane normal.     Left Ear: Tympanic membrane normal.     Nose: Nose normal. No congestion or rhinorrhea.     Mouth/Throat:     Pharynx: No oropharyngeal exudate or posterior oropharyngeal erythema.  Eyes:     Conjunctiva/sclera: Conjunctivae normal.     Pupils: Pupils are equal, round, and reactive to light.  Neck:     Thyroid: No thyroid mass or thyromegaly.  Cardiovascular:     Rate and Rhythm: Normal rate and regular rhythm.     Heart sounds: Normal heart sounds.  Pulmonary:     Effort: Pulmonary effort is normal.     Breath sounds: Normal breath sounds.  Abdominal:     Palpations: Abdomen is soft.     Tenderness: There is no abdominal tenderness.  Musculoskeletal:     Right lower leg: No swelling. No edema.     Left lower leg: No swelling. No edema.  Lymphadenopathy:     Cervical: No cervical adenopathy.  Skin:    General: Skin is warm.  Neurological:     Mental Status: She is alert and oriented to person, place, and time.  Psychiatric:        Attention and Perception: Attention normal.        Mood and Affect: Mood normal.        Speech: Speech normal.        Behavior: Behavior normal. Behavior is cooperative.     Last  depression screening scores    08/14/2022    8:59 AM 08/06/2021    9:30 AM 08/05/2020    1:37 PM  PHQ 2/9 Scores  PHQ - 2 Score 0 1 0  PHQ- 9 Score 2 4    Last fall risk screening    08/14/2022    8:59 AM  Fall Risk   Falls in the past year? 1  Risk for fall due to : History of fall(s)  Follow up Falls evaluation completed   Last Audit-C alcohol use screening    08/14/2022    9:00 AM  Alcohol Use Disorder Test (AUDIT)  2. How many drinks containing alcohol do you have on a typical day when you are drinking? 0  3. How often do you have six or more drinks on one occasion? 0   A score of 3 or more in women, and 4 or more in men indicates increased risk for alcohol abuse, EXCEPT if all of the points are from question 1   No results found for any visits on 08/14/22.  Assessment & Plan    Routine Health Maintenance and Physical Exam  Exercise Activities and Dietary recommendations --balanced diet high in fiber and protein, low in sugars, carbs, fats. --physical activity/exercise 30 minutes 3-5 times a week     Immunization History  Administered Date(s) Administered   PFIZER Comirnaty(Gray Top)Covid-19 Tri-Sucrose Vaccine 07/23/2020    Health Maintenance  Topic Date Due   HIV Screening  Never done   DTaP/Tdap/Td (1 - Tdap) Never done   Zoster Vaccines- Shingrix (1 of 2) Never done   COVID-19 Vaccine (2 - Pfizer risk series) 08/13/2020   PAP SMEAR-Modifier  07/17/2021   OPHTHALMOLOGY EXAM  04/02/2022   FOOT EXAM  09/04/2022  HEMOGLOBIN A1C  09/15/2022   MAMMOGRAM  09/18/2022   INFLUENZA VACCINE  10/15/2022   Diabetic kidney evaluation - eGFR measurement  03/18/2023   Diabetic kidney evaluation - Urine ACR  03/18/2023   Colonoscopy  09/19/2024   Hepatitis C Screening  Completed   HPV VACCINES  Aged Out    Discussed health benefits of physical activity, and encouraged her to engage in regular exercise appropriate for her age and condition.  Problem List Items  Addressed This Visit       Respiratory   OSA (obstructive sleep apnea)    Recently diagnosed and started on CPAP therapy. Patient reports disrupted sleep since starting therapy. -Continue CPAP therapy and monitor for improvement.        Endocrine   Hyperlipidemia associated with type 2 diabetes mellitus (HCC)    Managed with rosuvastatin 40 mg  Reviewed lipids LDL at goal      Relevant Medications   aspirin EC 81 MG tablet   Type 2 diabetes mellitus without complication, with long-term current use of insulin (HCC)    Last A1c 6.4% followed by endo  Uacr utd, on statin, farxiga.       Relevant Medications   aspirin EC 81 MG tablet     Other   Depression, recurrent (HCC)    Patient reports increased anxiety, potentially related to recent decrease in Cymbalta dosage from 120mg  to 60mg . -Increase Cymbalta back to 120mg  daily.      Relevant Medications   DULoxetine (CYMBALTA) 60 MG capsule   Elevated hemoglobin (HCC)     no clear etiology identified by oncologist. No history of smoking or other risk factors. Repeat cbc today      Relevant Orders   CBC w/Diff/Platelet   Comprehensive Metabolic Panel (CMET)   Tinnitus of both ears    Persistent ringing in the left ear, with hearing loss noted on audiogram. Patient has been evaluated by a neurologist and ENT, with a plan for potential hearing aid placement. -Follow up with ENT on July 2nd.      Other Visit Diagnoses     Annual physical exam    -  Primary      General Health Maintenance: -Administer first dose of Shingles vaccine today. -Schedule Pap smear and mammogram. -Plan for full body skin check with dermatologist. -Check A1C at next endocrinologist visit on July 9th.        Return in about 6 months (around 02/13/2023) for chronic conditions.     I, Valerie Ferguson, PA-C have reviewed all documentation for this visit. The documentation on  08/14/22   for the exam, diagnosis, procedures, and orders are all  accurate and complete.  Valerie Ferguson, PA-C [facility] [address] [place], [place], [address] Office: [phone] Fax: [phone]   [facility]

## 2022-08-14 ENCOUNTER — Ambulatory Visit (INDEPENDENT_AMBULATORY_CARE_PROVIDER_SITE_OTHER): Payer: Managed Care, Other (non HMO) | Admitting: Physician Assistant

## 2022-08-14 ENCOUNTER — Encounter: Payer: Self-pay | Admitting: Physician Assistant

## 2022-08-14 VITALS — BP 112/62 | HR 79 | Temp 97.7°F | Resp 14 | Ht 72.0 in | Wt 241.3 lb

## 2022-08-14 DIAGNOSIS — E119 Type 2 diabetes mellitus without complications: Secondary | ICD-10-CM

## 2022-08-14 DIAGNOSIS — Z23 Encounter for immunization: Secondary | ICD-10-CM | POA: Diagnosis not present

## 2022-08-14 DIAGNOSIS — H9313 Tinnitus, bilateral: Secondary | ICD-10-CM

## 2022-08-14 DIAGNOSIS — G4733 Obstructive sleep apnea (adult) (pediatric): Secondary | ICD-10-CM

## 2022-08-14 DIAGNOSIS — Z0000 Encounter for general adult medical examination without abnormal findings: Secondary | ICD-10-CM

## 2022-08-14 DIAGNOSIS — F339 Major depressive disorder, recurrent, unspecified: Secondary | ICD-10-CM | POA: Diagnosis not present

## 2022-08-14 DIAGNOSIS — Z794 Long term (current) use of insulin: Secondary | ICD-10-CM

## 2022-08-14 DIAGNOSIS — D582 Other hemoglobinopathies: Secondary | ICD-10-CM | POA: Diagnosis not present

## 2022-08-14 DIAGNOSIS — E1169 Type 2 diabetes mellitus with other specified complication: Secondary | ICD-10-CM

## 2022-08-14 DIAGNOSIS — E785 Hyperlipidemia, unspecified: Secondary | ICD-10-CM

## 2022-08-14 MED ORDER — DULOXETINE HCL 60 MG PO CPEP: 120.0000 mg | ORAL_CAPSULE | Freq: Every day | ORAL | 3 refills | Status: DC

## 2022-08-14 NOTE — Assessment & Plan Note (Signed)
Patient reports increased anxiety, potentially related to recent decrease in Cymbalta dosage from 120mg  to 60mg . -Increase Cymbalta back to 120mg  daily.

## 2022-08-14 NOTE — Assessment & Plan Note (Signed)
Last A1c 6.4% followed by endo  Uacr utd, on statin, farxiga.

## 2022-08-14 NOTE — Assessment & Plan Note (Signed)
Managed with rosuvastatin 40 mg  Reviewed lipids LDL at goal

## 2022-08-14 NOTE — Assessment & Plan Note (Addendum)
no clear etiology identified by oncologist. No history of smoking or other risk factors. Repeat cbc today

## 2022-08-14 NOTE — Assessment & Plan Note (Signed)
Recently diagnosed and started on CPAP therapy. Patient reports disrupted sleep since starting therapy. -Continue CPAP therapy and monitor for improvement.

## 2022-08-14 NOTE — Addendum Note (Signed)
Addended by: Lubertha Basque on: 08/14/2022 10:57 AM   Modules accepted: Orders

## 2022-08-14 NOTE — Assessment & Plan Note (Signed)
Persistent ringing in the left ear, with hearing loss noted on audiogram. Patient has been evaluated by a neurologist and ENT, with a plan for potential hearing aid placement. -Follow up with ENT on July 2nd.

## 2022-08-15 LAB — CBC WITH DIFFERENTIAL/PLATELET
Basophils Absolute: 0.1 10*3/uL (ref 0.0–0.2)
Basos: 1 %
EOS (ABSOLUTE): 0.5 10*3/uL — ABNORMAL HIGH (ref 0.0–0.4)
Eos: 6 %
Hematocrit: 44.2 % (ref 34.0–46.6)
Hemoglobin: 14.6 g/dL (ref 11.1–15.9)
Immature Grans (Abs): 0 10*3/uL (ref 0.0–0.1)
Immature Granulocytes: 0 %
Lymphocytes Absolute: 1.6 10*3/uL (ref 0.7–3.1)
Lymphs: 21 %
MCH: 29.3 pg (ref 26.6–33.0)
MCHC: 33 g/dL (ref 31.5–35.7)
MCV: 89 fL (ref 79–97)
Monocytes Absolute: 0.5 10*3/uL (ref 0.1–0.9)
Monocytes: 7 %
Neutrophils Absolute: 5 10*3/uL (ref 1.4–7.0)
Neutrophils: 65 %
Platelets: 253 10*3/uL (ref 150–450)
RBC: 4.98 x10E6/uL (ref 3.77–5.28)
RDW: 12.5 % (ref 11.7–15.4)
WBC: 7.8 10*3/uL (ref 3.4–10.8)

## 2022-08-15 LAB — COMPREHENSIVE METABOLIC PANEL
ALT: 33 IU/L — ABNORMAL HIGH (ref 0–32)
AST: 29 IU/L (ref 0–40)
Albumin/Globulin Ratio: 1.9 (ref 1.2–2.2)
Albumin: 4.6 g/dL (ref 3.8–4.9)
Alkaline Phosphatase: 62 IU/L (ref 44–121)
BUN/Creatinine Ratio: 11 (ref 9–23)
BUN: 11 mg/dL (ref 6–24)
Bilirubin Total: 0.5 mg/dL (ref 0.0–1.2)
CO2: 24 mmol/L (ref 20–29)
Calcium: 10.1 mg/dL (ref 8.7–10.2)
Chloride: 101 mmol/L (ref 96–106)
Creatinine, Ser: 0.98 mg/dL (ref 0.57–1.00)
Globulin, Total: 2.4 g/dL (ref 1.5–4.5)
Glucose: 117 mg/dL — ABNORMAL HIGH (ref 70–99)
Potassium: 4.9 mmol/L (ref 3.5–5.2)
Sodium: 142 mmol/L (ref 134–144)
Total Protein: 7 g/dL (ref 6.0–8.5)
eGFR: 68 mL/min/{1.73_m2} (ref >59)

## 2022-09-12 ENCOUNTER — Other Ambulatory Visit: Payer: Self-pay | Admitting: Internal Medicine

## 2022-09-21 NOTE — Progress Notes (Unsigned)
Name: Valerie Schmitt  Age/ Sex: 57 y.o., female   MRN/ DOB: [id number], 09-07-1965     PCP: [name], PA-C   Reason for Endocrinology Evaluation: Type 2 Diabetes Mellitus  Initial Endocrine Consultative Visit: 04/26/2019    PATIENT IDENTIFIER: Valerie Schmitt is a 57 y.o. female with a past medical history of T2DM and Dyslipidemia. The patient has followed with Endocrinology clinic since 04/26/2019 for consultative assistance with management of her diabetes.  DIABETIC HISTORY:  [name] was diagnosed with T2DM in 2016. She has been on Glimepiride in the past without reported intolerance.She moved from [place] 06/2018 and records were not available.  Her hemoglobin A1c was 12.9% on initial visit.  On her initial visit to our clinic, her A1c was 12.9%. She was already on MDI regimen , metformin and Ozempic which were continued.   SUBJECTIVE:   During the last visit (03/17/2022): A1c 6.4 %     Today (09/22/2022): [name] is here for a follow up on diabetes management.  She checks her blood sugars occasionally preprandial . The patient has  had hypoglycemic episodes since the last clinic visit, she is symptomatic with these episodes    Pt noted with weight loss  Denies nausea, vomiting  Denies constipation or diarrhea     HOME DIABETES REGIMEN:  Metformin 1000 mg BID Farxiga 10 mg daily  Ozempic 2 mg weekly  Basaglar 30 units daily Humalog  5  units with Brunch and 6 units with Supper  Correction scale : Humalog (BG-130/20)  Rosuvastatin 40 mg daily       Statin: yes ACE-I/ARB: no    CONTINUOUS GLUCOSE MONITORING RECORD INTERPRETATION    Dates of Recording:6/26-09/22/2022  Sensor description:dexcom  Results statistics:   CGM use % of time 93  Average and SD 129/34  Time in range 90 %  % Time Above 180 9  % Time above 250 <1  % Time Below target <1      Glycemic patterns summary: optimal BG's through the night and most of the day    Hyperglycemic episodes  post supper   Hypoglycemic episodes occurred during the night and middle of the day  Overnight periods: Trends down      DIABETIC COMPLICATIONS: Microvascular complications:   Denies: CKD, neuropathy, retinopathy  Last Eye Exam: Completed 03/2021  Macrovascular complications:  Denies: CAD, CVA, PVD   HISTORY:  Past Medical History:  Past Medical History:  Diagnosis Date   Allergy    Anxiety    Arthritis    Asthma    Depression    Diabetes mellitus without complication (HCC)    Ketoacidosis 03/2016   Sleep apnea    Thyroid disease    Past Surgical History:  Past Surgical History:  Procedure Laterality Date   BREAST SURGERY     COLONOSCOPY WITH PROPOFOL N/A 09/19/2021   Procedure: COLONOSCOPY WITH PROPOFOL;  Surgeon: Wyline Mood, MD;  Location: [facility] ENDOSCOPY;  Service: Gastroenterology;  Laterality: N/A;  DM   HERNIA REPAIR     REDUCTION MAMMAPLASTY Bilateral 2003   TUBAL LIGATION     Social History:  reports that she has never smoked. She has never used smokeless tobacco. She reports that she does not currently use alcohol. She reports that she does not use drugs. Family History:  Family History  Problem Relation Age of Onset   Diabetes Father    Scoliosis Daughter    Autoimmune disease Daughter    Sleep apnea Daughter  Anxiety disorder Daughter    Depression Daughter    Personality disorder Daughter    Osteoporosis Maternal Grandmother    Alzheimer's disease Paternal Grandmother    Liver disease Paternal Grandfather    Breast cancer Neg Hx      HOME MEDICATIONS: Allergies as of 09/22/2022   No Known Allergies      Medication List        Accurate as of September 22, 2022  7:38 AM. If you have any questions, ask your nurse or doctor.          aspirin EC 81 MG tablet Take 81 mg by mouth daily. Swallow whole.   Basaglar KwikPen 100 UNIT/ML INJECT 30 UNITS INTO THE SKIN DAILY   dapagliflozin propanediol 10 MG Tabs  tablet Commonly known as: Farxiga Take 1 tablet (10 mg total) by mouth daily before breakfast.   Dexcom G6 Sensor Misc 1 DEVICE BY DOES NOT APPLY ROUTE AS DIRECTED   Dexcom G6 Transmitter Misc 1 DEVICE BY DOES NOT APPLY ROUTE AS DIRECTED   DULoxetine 60 MG capsule Commonly known as: Cymbalta Take 2 capsules (120 mg total) by mouth daily.   HumaLOG KwikPen 200 UNIT/ML KwikPen Generic drug: insulin lispro Max daily 30 units with correction scale   Insulin Pen Needle 31G X 5 MM Misc 1 Device by Does not apply route in the morning, at noon, in the evening, and at bedtime.   levothyroxine 88 MCG tablet Commonly known as: SYNTHROID TAKE 1 TABLET BY MOUTH EVERY DAY BEFORE BREAKFAST   metFORMIN 1000 MG tablet Commonly known as: GLUCOPHAGE Take 1 tablet (1,000 mg total) by mouth 2 (two) times daily with a meal.   OneTouch UltraLink w/Device Kit by Does not apply route 3 (three) times daily.   Ozempic (2 MG/DOSE) 8 MG/3ML Sopn Generic drug: Semaglutide (2 MG/DOSE) Inject 2 mg into the skin once a week.   rizatriptan 10 MG disintegrating tablet Commonly known as: MAXALT-MLT TAKE 1 TABLET BY MOUTH AS NEEDED FOR MIGRAINE. MAY REPEAT IN 2 HOURS IF NEEDED   rosuvastatin 40 MG tablet Commonly known as: CRESTOR Take 1 tablet (40 mg total) by mouth daily.   traZODone 100 MG tablet Commonly known as: DESYREL TAKE 2 TABLETS (200 MG TOTAL) BY MOUTH AT BEDTIME AS NEEDED FOR SLEEP         OBJECTIVE:   Vital Signs: BP 118/76 (BP Location: Left Arm, Patient Position: Sitting, Cuff Size: Large)   Pulse 91   Ht 6' (1.829 m)   Wt 238 lb (108 kg)   SpO2 97%   BMI 32.28 kg/m   Wt Readings from Last 3 Encounters:  09/22/22 238 lb (108 kg)  08/14/22 241 lb 4.8 oz (109.5 kg)  05/05/22 247 lb 3.2 oz (112.1 kg)     Exam: General: Pt appears well and is in NAD  Lungs: Clear with good BS bilat with no rales, rhonchi, or wheezes  Heart: RRR with normal S1 and S2 and no gallops; no  murmurs; no rub  Abdomen: Normoactive bowel sounds, soft, nontender, without masses or organomegaly palpable  Extremities: No pretibial edema.   Neuro: MS is good with appropriate affect, pt is alert and Ox3      DM foot exam: 09/22/2022   The skin of the feet is intact  The pedal pulses are 2+ on right and 2+ on left. The sensation is intact to a screening 5.07, 10 gram monofilament bilaterally    DATA REVIEWED:  Lab Results  Component Value Date   HGBA1C 5.9 (A) 09/22/2022   HGBA1C 6.4 (A) 03/17/2022   HGBA1C 5.9 (A) 09/03/2021    Latest Reference Range & Units 08/14/22 09:30  Sodium 134 - 144 mmol/L 142  Potassium 3.5 - 5.2 mmol/L 4.9  Chloride 96 - 106 mmol/L 101  CO2 20 - 29 mmol/L 24  Glucose 70 - 99 mg/dL 540 (H)  BUN 6 - 24 mg/dL 11  Creatinine 9.81 - 1.91 mg/dL 4.78  Calcium 8.7 - 29.5 mg/dL 62.1  BUN/Creatinine Ratio 9 - 23  11  eGFR >59 mL/min/1.73 68  Alkaline Phosphatase 44 - 121 IU/L 62  Albumin 3.8 - 4.9 g/dL 4.6  Albumin/Globulin Ratio 1.2 - 2.2  1.9  AST 0 - 40 IU/L 29  ALT 0 - 32 IU/L 33 (H)  Total Protein 6.0 - 8.5 g/dL 7.0  Total Bilirubin 0.0 - 1.2 mg/dL 0.5  (H): Data is abnormally high    ASSESSMENT / PLAN / RECOMMENDATIONS:   1) Type 2 Diabetes Mellitus, Optimally  controlled, Without complications - Most recent A1c of 5.9 %. Goal A1c < 7.0 %.    - A1c remains optimal -In reviewing his CGM, patient has been noted with hypoglycemia at night and after brunch, will reduce insulin accordingly -Will contain the idea of switching to [facility] once shortage of supply have resolved   MEDICATIONS: - Continue Metformin 1000 mg TWice a day  - Continue Farxiga 10 mg daily  - Continue Ozempic 2 mg weekly  - Decrease Basaglar  26 units ONCE a day  - Change Humalog 4 units with brunch and 6 units with supper  - Continue Correction scale : Humalog (BG-130/20)   EDUCATION / INSTRUCTIONS: BG monitoring instructions: Patient is instructed to check her  blood sugars 3 times a day, before each meal . Call [place] [facility] clinic if: BG persistently < 70  I reviewed the Rule of 15 for the treatment of hypoglycemia in detail with the patient. Literature supplied.     2) Diabetic complications:  Eye: Does not have known diabetic retinopathy.  Neuro/ Feet: Does not have known diabetic peripheral neuropathy .  Renal: Patient does not have known baseline CKD. She   is not on an ACEI/ARB at present.   3) Dyslipidemia :   -I have switched atorvastatin 80 mg to rosuvastatin 40 mg daily and her lipid panel has improved -TG normal 147 mg/DL, LDL at goal 56 mg/DL on labs 05/863 -No change  Medication   Continue Rosuvastatin 40 mg daily     F/U in 6 months    Signed electronically by: Lyndle Herrlich, MD  [facility] Endocrinology  [facility] [address] [place] [place], [place] [address] Phone: [phone] FAX: [phone]   CC: Burnett Corrente [address] [place] [address] Phone: [phone]  Fax: [phone]  Return to Endocrinology clinic as below: Future Appointments  Date Time Provider Department Center  10/29/2022  2:00 PM Erin Fulling, MD [id number] None

## 2022-09-22 ENCOUNTER — Encounter: Payer: Self-pay | Admitting: Internal Medicine

## 2022-09-22 ENCOUNTER — Ambulatory Visit: Payer: Managed Care, Other (non HMO) | Admitting: Internal Medicine

## 2022-09-22 VITALS — BP 118/76 | HR 91 | Ht 72.0 in | Wt 238.0 lb

## 2022-09-22 DIAGNOSIS — Z794 Long term (current) use of insulin: Secondary | ICD-10-CM

## 2022-09-22 DIAGNOSIS — E119 Type 2 diabetes mellitus without complications: Secondary | ICD-10-CM

## 2022-09-22 DIAGNOSIS — Z7984 Long term (current) use of oral hypoglycemic drugs: Secondary | ICD-10-CM

## 2022-09-22 DIAGNOSIS — Z7985 Long-term (current) use of injectable non-insulin antidiabetic drugs: Secondary | ICD-10-CM

## 2022-09-22 DIAGNOSIS — E782 Mixed hyperlipidemia: Secondary | ICD-10-CM

## 2022-09-22 LAB — POCT GLYCOSYLATED HEMOGLOBIN (HGB A1C): Hemoglobin A1C: 5.9 % — AB (ref 4.0–5.6)

## 2022-09-22 MED ORDER — ROSUVASTATIN CALCIUM 40 MG PO TABS: 40.0000 mg | ORAL_TABLET | Freq: Every day | ORAL | 3 refills | Status: DC

## 2022-09-22 MED ORDER — BASAGLAR KWIKPEN 100 UNIT/ML ~~LOC~~ SOPN: 26.0000 [IU] | PEN_INJECTOR | Freq: Every day | SUBCUTANEOUS | 3 refills | Status: DC

## 2022-09-22 MED ORDER — OZEMPIC (2 MG/DOSE) 8 MG/3ML ~~LOC~~ SOPN: 2.0000 mg | PEN_INJECTOR | SUBCUTANEOUS | 3 refills | Status: DC

## 2022-09-22 MED ORDER — METFORMIN HCL 1000 MG PO TABS: 1000.0000 mg | ORAL_TABLET | Freq: Two times a day (BID) | ORAL | 3 refills | Status: DC

## 2022-09-22 MED ORDER — DAPAGLIFLOZIN PROPANEDIOL 10 MG PO TABS: 10.0000 mg | ORAL_TABLET | Freq: Every day | ORAL | 3 refills | Status: DC

## 2022-09-22 MED ORDER — HUMALOG KWIKPEN 200 UNIT/ML ~~LOC~~ SOPN: PEN_INJECTOR | SUBCUTANEOUS | 3 refills | Status: DC

## 2022-09-22 MED ORDER — INSULIN PEN NEEDLE 31G X 5 MM MISC: 1.0000 | Freq: Four times a day (QID) | 3 refills | Status: DC

## 2022-09-22 NOTE — Patient Instructions (Signed)
-   Continue Metformin 1000 mg twice daily  - Continue Ozempic 2 mg weekly  - Continue Farxiga 10 mg daily  - Decrease  Basaglar  26 units daily  - Change Humalog 4 units with Brunch and 6 with Supper  - Humalog correctional insulin: ADD extra units on insulin to your meal-time Humalog dose if your blood sugars are higher than 150. Use the scale below to help guide you:   Blood sugar before meal Number of units to inject  Less than 150 0 unit  151 -  170 1 units  171 -  190 2 units  191 -  210 3 units  211 -  230 4 units  231 -  250 5 units  251 -  270 6 units  271 -  290 7 units  291 -  310 8 units  311 - 330 9 units         HOW TO TREAT LOW BLOOD SUGARS (Blood sugar LESS THAN 70 MG/DL) Please follow the RULE OF 15 for the treatment of hypoglycemia treatment (when your (blood sugars are less than 70 mg/dL)   STEP 1: Take 15 grams of carbohydrates when your blood sugar is low, which includes:  3-4 GLUCOSE TABS  OR 3-4 OZ OF JUICE OR REGULAR SODA OR ONE TUBE OF GLUCOSE GEL    STEP 2: RECHECK blood sugar in 15 MINUTES STEP 3: If your blood sugar is still low at the 15 minute recheck --> then, go back to STEP 1 and treat AGAIN with another 15 grams of carbohydrates.

## 2022-10-03 ENCOUNTER — Other Ambulatory Visit: Payer: Self-pay | Admitting: Internal Medicine

## 2022-10-13 LAB — HM DIABETES EYE EXAM

## 2022-10-14 ENCOUNTER — Encounter: Payer: Self-pay | Admitting: Internal Medicine

## 2022-10-29 ENCOUNTER — Ambulatory Visit: Payer: Managed Care, Other (non HMO) | Admitting: Internal Medicine

## 2022-10-29 ENCOUNTER — Encounter: Payer: Self-pay | Admitting: Internal Medicine

## 2022-10-29 VITALS — BP 130/60 | HR 89 | Temp 97.8°F | Ht 72.0 in | Wt 242.6 lb

## 2022-10-29 DIAGNOSIS — G4733 Obstructive sleep apnea (adult) (pediatric): Secondary | ICD-10-CM

## 2022-10-29 NOTE — Patient Instructions (Signed)
Continue Therapy as prescribed   You get an A!! Great Job!   Recommend Weight loss

## 2022-10-29 NOTE — Progress Notes (Signed)
Name: Asayo Buckalew MRN: [id number] DOB: 1965-04-03    CHIEF COMPLAINT:  Follow Up Assessment of Sleep Anea   HISTORY OF PRESENT ILLNESS: Follow up OSA assessment HST performed shows AHI 21 on  07/2022   CPAP download August 2024 reviewed in detail Patient has significantly reduced AHI to 1.4 Auto CPAP 5-10   No evidence of heart failure at this time No evidence or signs of infection at this time No respiratory distress No fevers, chills, nausea, vomiting, diarrhea No evidence of lower extremity edema No evidence hemoptysis   Discussed sleep data and reviewed with patient.  Encouraged proper weight management.  Discussed driving precautions and its relationship with hypersomnolence.  Discussed operating dangerous equipment and its relationship with hypersomnolence.  Discussed sleep hygiene, and benefits of a fixed sleep waked time.  The importance of getting eight or more hours of sleep discussed with patient.  Discussed limiting the use of the computer and television before bedtime.  Decrease naps during the day, so night time sleep will become enhanced.  Limit caffeine, and sleep deprivation.  HTN, stroke, and heart failure are potential risk factors.  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  has a past surgical history that includes Breast surgery; Hernia repair; Tubal ligation; Reduction  mammaplasty (Bilateral, 2003); and Colonoscopy with propofol (N/A, 09/19/2021). Prior to Admission medications   Medication Sig Start Date End Date Taking? Authorizing Provider  aspirin EC 81 MG tablet Take 81 mg by mouth daily. Swallow whole.    [provider]  Blood Glucose Monitoring Suppl (ONETOUCH ULTRALINK) w/Device KIT by Does not apply route 3 (three) times daily.    [provider]  Continuous Blood Gluc Transmit (DEXCOM G6 TRANSMITTER) MISC 1 DEVICE BY DOES NOT APPLY ROUTE AS DIRECTED 10/13/21   Shamleffer, Konrad Dolores, MD  Continuous Glucose Sensor (DEXCOM G6 SENSOR) MISC USE AS DIRECTED 10/05/22   Shamleffer, Konrad Dolores, MD  dapagliflozin propanediol (FARXIGA) 10 MG TABS tablet Take 1 tablet (10 mg total) by mouth daily before breakfast. 09/22/22   Shamleffer, Konrad Dolores, MD  DULoxetine (CYMBALTA) 60 MG capsule Take 2 capsules (120 mg total) by mouth daily. 08/14/22   [name], PA-C  Insulin Glargine (BASAGLAR KWIKPEN) 100 UNIT/ML Inject 26 Units into the skin daily. 09/22/22   Shamleffer, Konrad Dolores, MD  insulin lispro (HUMALOG KWIKPEN) 200 UNIT/ML KwikPen Max daily 30 units with correction scale 09/22/22   Shamleffer, Konrad Dolores, MD  Insulin Pen Needle 31G X 5 MM MISC 1 Device by Does not apply route in the morning, at noon, in the evening, and at bedtime. 09/22/22   Shamleffer, Konrad Dolores, MD  levothyroxine (SYNTHROID) 88 MCG tablet TAKE 1 TABLET BY MOUTH EVERY DAY BEFORE BREAKFAST 07/17/22   Drubel, Lillia Abed, PA-C  metFORMIN (GLUCOPHAGE) 1000 MG tablet Take 1 tablet (1,000 mg total) by mouth 2 (two) times daily with a meal. 09/22/22   Shamleffer, Konrad Dolores, MD  rizatriptan (MAXALT-MLT) 10 MG disintegrating tablet TAKE 1  TABLET BY MOUTH AS NEEDED FOR MIGRAINE. MAY REPEAT IN 2 HOURS IF NEEDED 11/03/21   Drubel, Lillia Abed, PA-C  rosuvastatin (CRESTOR) 40 MG tablet Take 1 tablet (40 mg total) by mouth daily. 09/22/22   Shamleffer, Konrad Dolores, MD   Semaglutide, 2 MG/DOSE, (OZEMPIC, 2 MG/DOSE,) 8 MG/3ML SOPN Inject 2 mg into the skin once a week. 09/22/22   Shamleffer, Konrad Dolores, MD  traZODone (DESYREL) 100 MG tablet TAKE 2 TABLETS (200 MG TOTAL) BY MOUTH AT BEDTIME AS NEEDED FOR SLEEP 08/03/22   Drubel, Lillia Abed, PA-C   No Known Allergies  FAMILY HISTORY:  family history includes Alzheimer's disease in her paternal grandmother; Anxiety disorder in her daughter; Autoimmune disease in her daughter; Depression in her daughter; Diabetes in her father; Liver disease in her paternal grandfather; Osteoporosis in her maternal grandmother; Personality disorder in her daughter; Scoliosis in her daughter; Sleep apnea in her daughter. SOCIAL HISTORY:  reports that she has never smoked. She has never used smokeless tobacco. She reports that she does not currently use alcohol. She reports that she does not use drugs.  BP 130/60 (BP Location: Left Arm, Cuff Size: Normal)   Pulse 89   Temp 97.8 F (36.6 C) (Temporal)   Ht 6' (1.829 m)   Wt 242 lb 9.6 oz (110 kg)   SpO2 97%   BMI 32.90 kg/m      Review of Systems: Gen:  Denies  fever, sweats, chills weight loss  HEENT: Denies blurred vision, double vision, ear pain, eye pain, hearing loss, nose bleeds, sore throat Cardiac:  No dizziness, chest pain or heaviness, chest tightness,edema, No JVD Resp:   No cough, -sputum production, -shortness of breath,-wheezing, -hemoptysis,  Other:  All other systems negative   Physical Examination:   General Appearance: No distress  EYES PERRLA, EOM intact.   NECK Supple, No JVD Pulmonary: normal breath sounds, No wheezing.  CardiovascularNormal S1,S2.  No m/r/g.   Abdomen: Benign, Soft, non-tender. Neurology UE/LE 5/5 strength, no focal deficits Ext pulses intact, cap refill intact ALL OTHER ROS ARE NEGATIVE    ASSESSMENT AND PLAN SYNOPSIS  Patient with signs and symptoms and diagnosis of OSA  in the setting of obesity and deconditioned  state  OSA assessment Well controlled AHI reduced to 1.6 Patient uses nasal pillows Previous AHI was 21 Patient is NOT tolerating the device however we will continue to use CPAP therapy She does not want oral device or inspire device evaluation   Obesity -recommend significant weight loss -recommend changing diet  Deconditioned state -Recommend increased daily activity and exercise    MEDICATION ADJUSTMENTS/LABS AND TESTS ORDERED: Continue Therapy as prescribed Recommend Weight loss   CURRENT MEDICATIONS REVIEWED AT LENGTH WITH PATIENT TODAY   Patient  satisfied with Plan of action and management. All questions answered  Follow-up 1 year  Total time spent 31 minutes   Lucie Leather, M.D.  [facility]  Medical Director [facility] Medical Director [facility] Cardio-Pulmonary Department

## 2022-11-04 ENCOUNTER — Other Ambulatory Visit: Payer: Self-pay | Admitting: Internal Medicine

## 2022-12-13 ENCOUNTER — Other Ambulatory Visit: Payer: Self-pay | Admitting: Internal Medicine

## 2022-12-13 ENCOUNTER — Other Ambulatory Visit: Payer: Self-pay | Admitting: Physician Assistant

## 2022-12-13 DIAGNOSIS — G43109 Migraine with aura, not intractable, without status migrainosus: Secondary | ICD-10-CM

## 2023-01-29 ENCOUNTER — Other Ambulatory Visit: Payer: Self-pay | Admitting: Physician Assistant

## 2023-01-29 NOTE — Telephone Encounter (Signed)
Requested medication (s) are due for refill today: yes  Requested medication (s) are on the active medication list: yes  Last refill:  07/17/22  Future visit scheduled: no  Notes to clinic:  Unable to refill per protocol due to failed labs, no TSH updated results.      Requested Prescriptions  Pending Prescriptions Disp Refills   levothyroxine (SYNTHROID) 88 MCG tablet [Pharmacy Med Name: LEVOTHYROXINE 88 MCG TABLET] 90 tablet 1    Sig: TAKE 1 TABLET BY MOUTH EVERY DAY BEFORE BREAKFAST     Endocrinology:  Hypothyroid Agents Failed - 01/29/2023  1:32 AM      Failed - TSH in normal range and within 360 days    TSH  Date Value Ref Range Status  08/06/2021 1.960 0.450 - 4.500 uIU/mL Final         Passed - Valid encounter within last 12 months    Recent Outpatient Visits           5 months ago Need for shingles vaccine   [facility] [name], PA-C   11 months ago Type 2 diabetes mellitus with hyperglycemia, with long-term current use of insulin [facility])   [facility] [name], PA-C   1 year ago Encounter for health maintenance examination   [facility] [name], PA-C   2 years ago Routine health maintenance   [facility] Chrismon, Jodell Cipro, [place]   2 years ago Insomnia, unspecified type   [facility] Flinchum, Eula Fried, FNP

## 2023-02-21 ENCOUNTER — Other Ambulatory Visit: Payer: Self-pay | Admitting: Physician Assistant

## 2023-02-23 NOTE — Telephone Encounter (Signed)
Requested medication (s) are due for refill today -yes  Requested medication (s) are on the active medication list -yes  Future visit scheduled -no  Last refill: 01/29/23 #30  Notes to clinic: fail lab protocol- over 1 year-08/06/21, requesting 90 day Rx  Requested Prescriptions  Pending Prescriptions Disp Refills   levothyroxine (SYNTHROID) 88 MCG tablet [Pharmacy Med Name: LEVOTHYROXINE 88 MCG TABLET] 90 tablet 1    Sig: TAKE 1 TABLET BY MOUTH EVERY DAY BEFORE BREAKFAST     Endocrinology:  Hypothyroid Agents Failed - 02/21/2023 11:31 AM      Failed - TSH in normal range and within 360 days    TSH  Date Value Ref Range Status  08/06/2021 1.960 0.450 - 4.500 uIU/mL Final         Passed - Valid encounter within last 12 months    Recent Outpatient Visits           6 months ago Need for shingles vaccine   [facility] [name], PA-C   1 year ago Type 2 diabetes mellitus with hyperglycemia, with long-term current use of insulin (HCC)   [facility] [name], PA-C   1 year ago Encounter for health maintenance examination   [facility] [name], PA-C   2 years ago Routine health maintenance   [place] [facility] Chrismon, Jodell Cipro, PA-C   3 years ago Insomnia, unspecified type   [facility] Flinchum, Eula Fried, FNP                 Requested Prescriptions  Pending Prescriptions Disp Refills   levothyroxine (SYNTHROID) 88 MCG tablet [Pharmacy Med Name: LEVOTHYROXINE 88 MCG TABLET] 90 tablet 1    Sig: TAKE 1 TABLET BY MOUTH EVERY DAY BEFORE BREAKFAST     Endocrinology:  Hypothyroid Agents Failed - 02/21/2023 11:31 AM      Failed - TSH in normal range and within 360 days    TSH  Date Value Ref Range Status  08/06/2021 1.960 0.450 - 4.500 uIU/mL Final         Passed - Valid encounter within last 12 months    Recent Outpatient  Visits           6 months ago Need for shingles vaccine   [facility] [name], PA-C   1 year ago Type 2 diabetes mellitus with hyperglycemia, with long-term current use of insulin [facility])   [facility] [name], PA-C   1 year ago Encounter for health maintenance examination   [facility] [name], PA-C   2 years ago Routine health maintenance   [place] [facility] Chrismon, Jodell Cipro, PA-C   3 years ago Insomnia, unspecified type   [facility] Flinchum, Eula Fried, FNP

## 2023-02-24 IMAGING — MG MM DIGITAL SCREENING BILAT W/ TOMO AND CAD
8 of 14 series · 8 of 40 positions shown · non-contrast
Comparison: Previous exam(s).

ACR Breast Density Category a: The breast tissue is almost entirely
fatty.

CLINICAL DATA: Screening.

EXAM:
DIGITAL SCREENING BILATERAL MAMMOGRAM WITH TOMOSYNTHESIS AND CAD
TECHNIQUE: Bilateral screening digital craniocaudal and mediolateral oblique
mammograms were obtained. Bilateral screening digital breast
tomosynthesis was performed. The images were evaluated with
computer-aided detection.

[L CC synth-2D (1 of 2)]
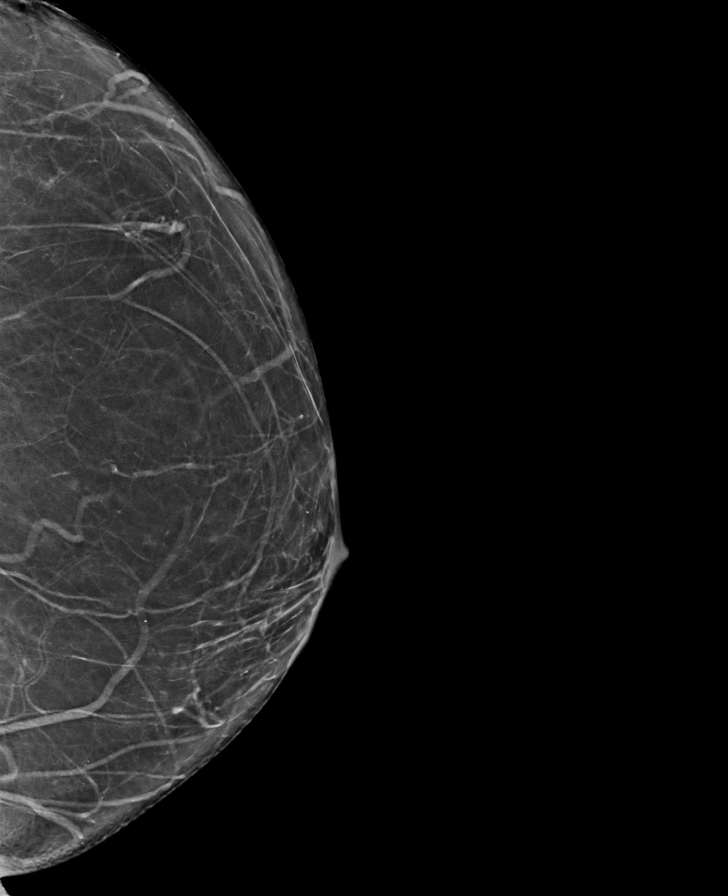

[R XCCM synth-2D]
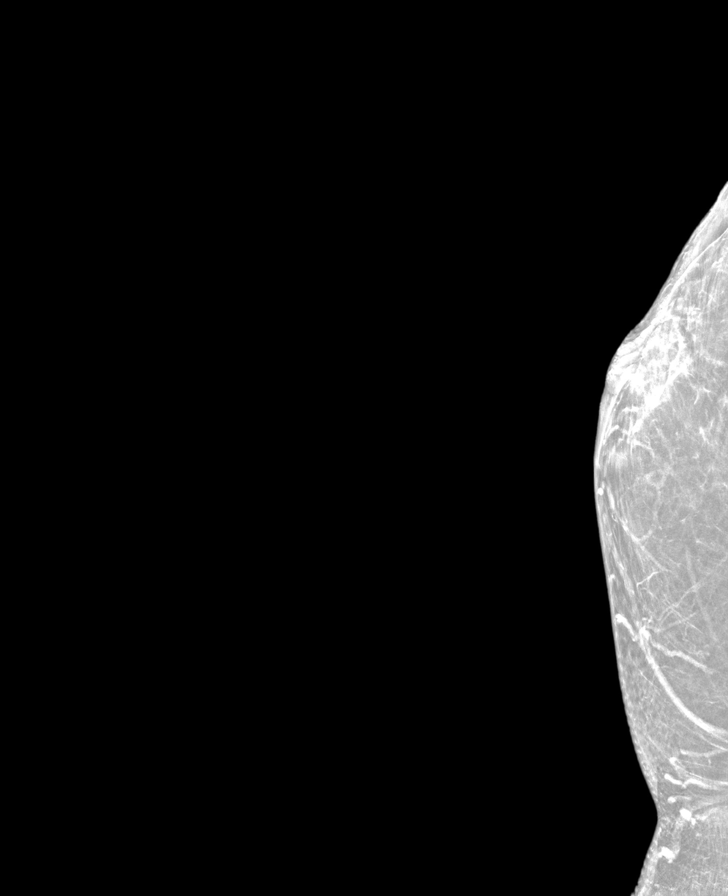

[R MLO synth-2D (1 of 2)]
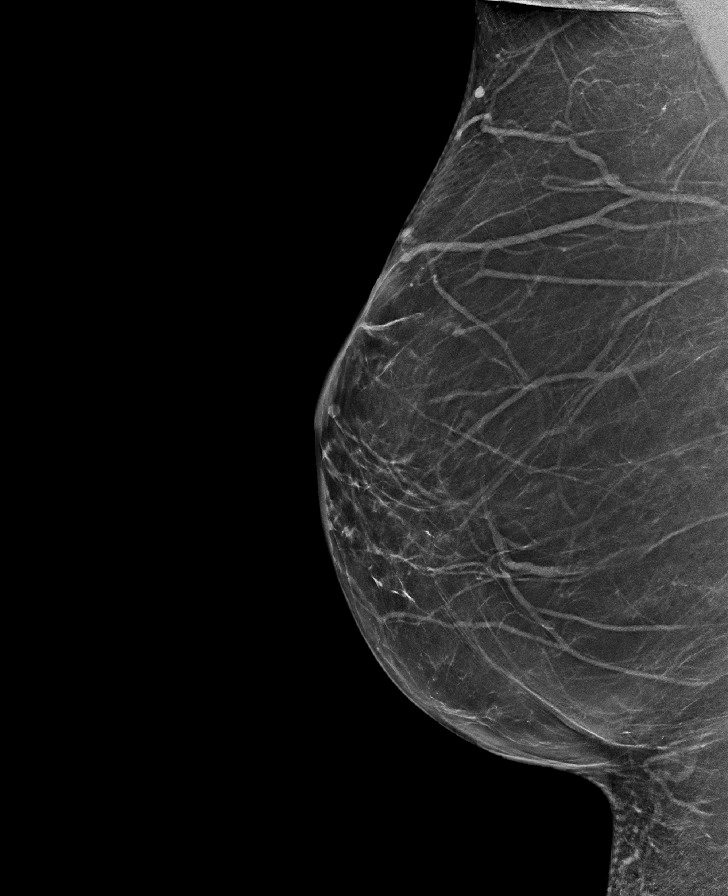

[L MLO synth-2D]
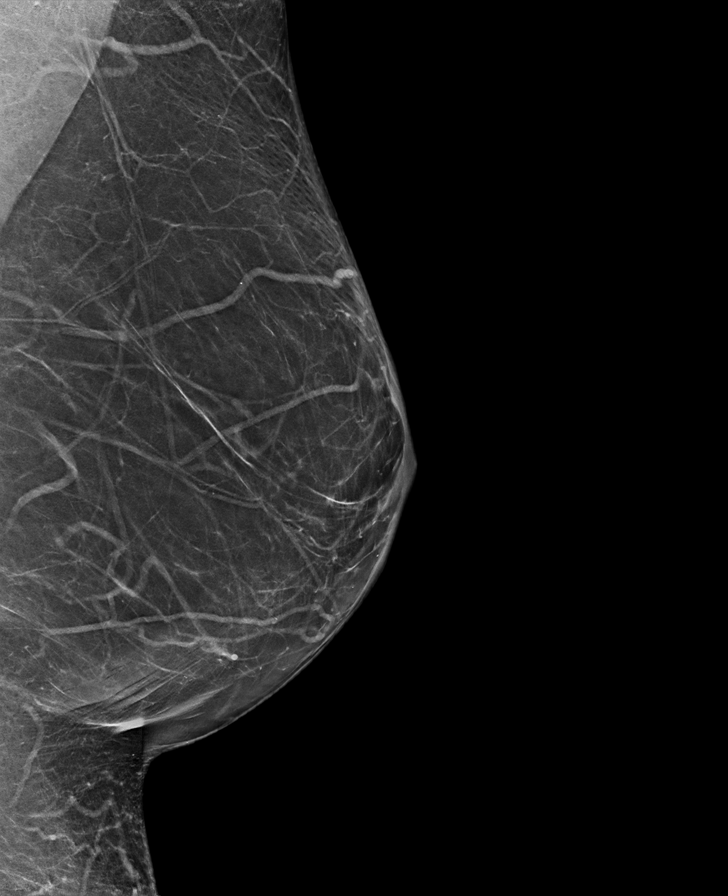

[L CC synth-2D (2 of 2)]
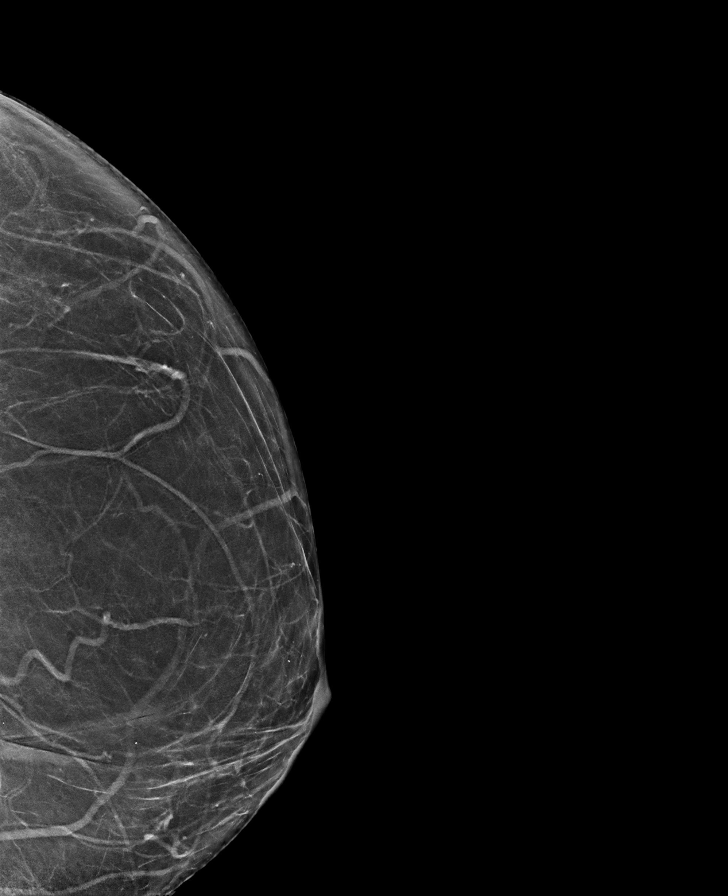

[R CC synth-2D]
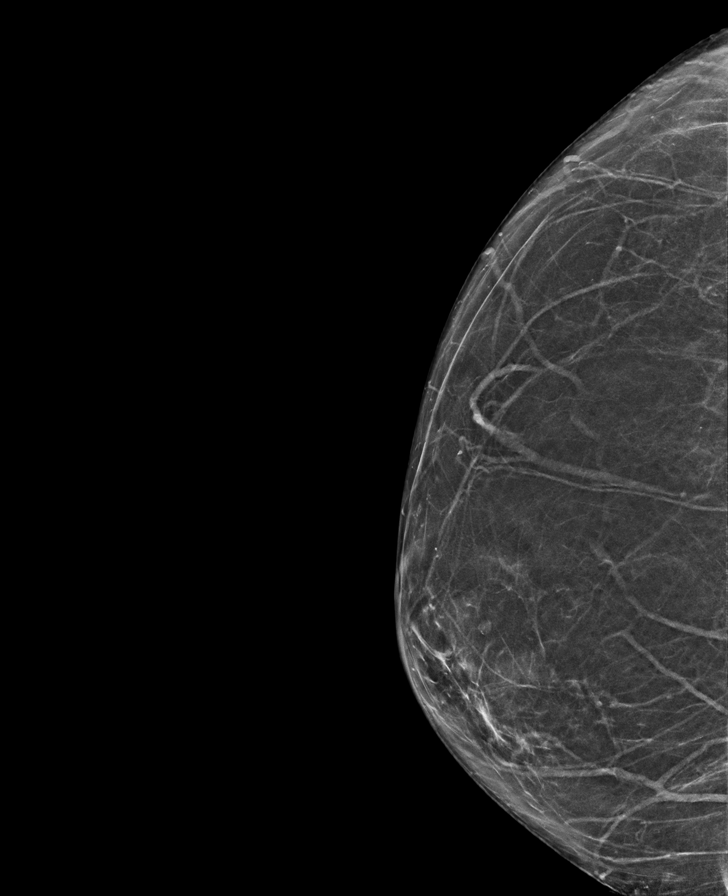

[R MLO synth-2D (2 of 2)]
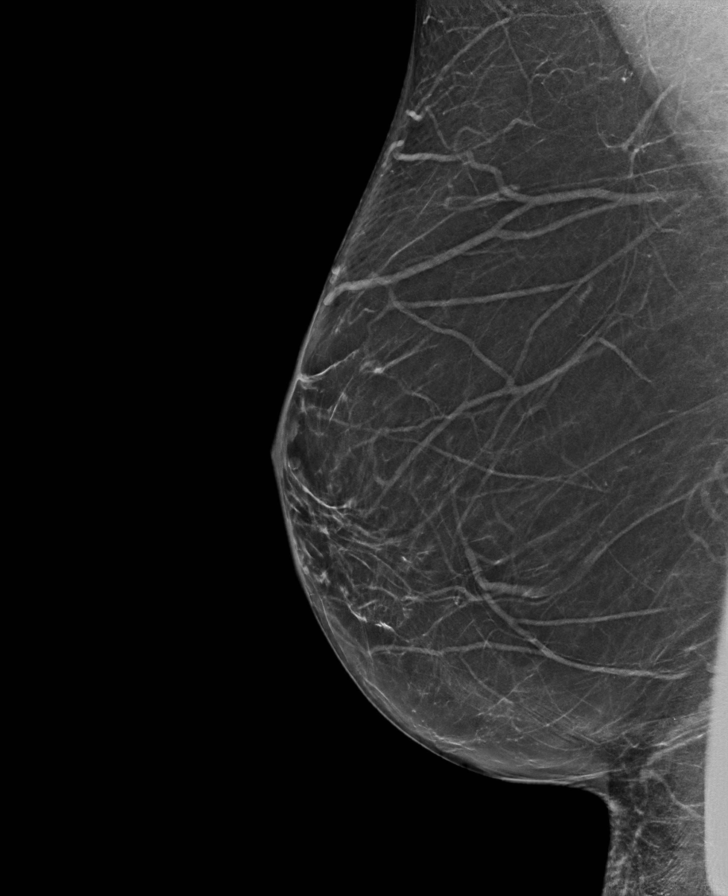

[R XCCM BREAST TOMOSYNTHESIS IMAGE tomo · tomo slice 31/61.0]
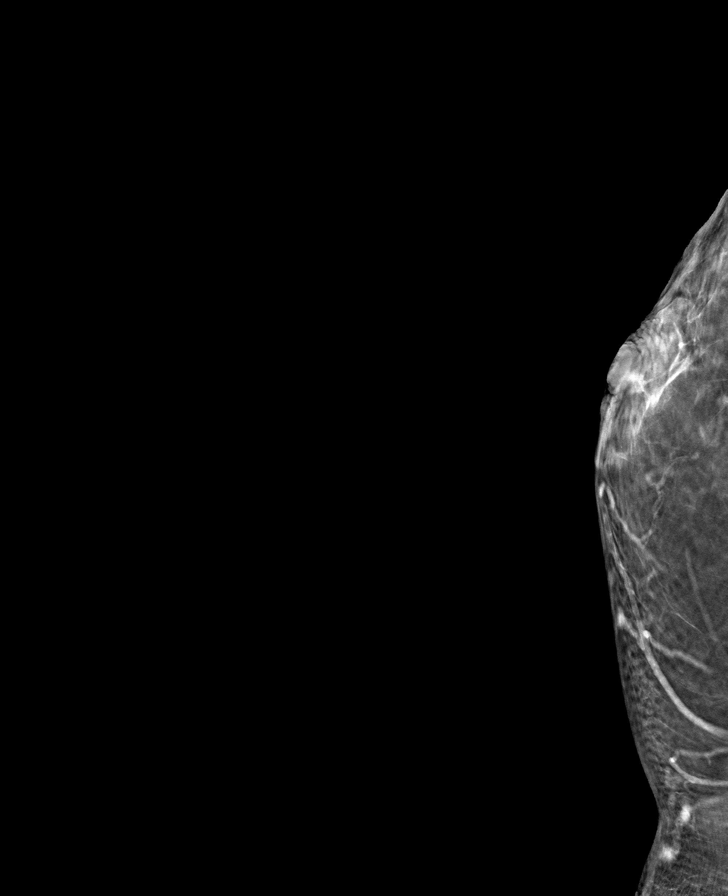

[8 of 40 positions shown; findings below may reference images not displayed]

FINDINGS: There are no findings suspicious for malignancy. The images were
evaluated with computer-aided detection.
IMPRESSION: No mammographic evidence of malignancy. A result letter of this
screening mammogram will be mailed directly to the patient.

RECOMMENDATION:
Screening mammogram in one year. (Code:[id number]-[id number])

BI-RADS CATEGORY  1: Negative.

## 2023-03-31 ENCOUNTER — Encounter: Payer: Self-pay | Admitting: Internal Medicine

## 2023-03-31 ENCOUNTER — Ambulatory Visit: Payer: Managed Care, Other (non HMO) | Admitting: Internal Medicine

## 2023-03-31 VITALS — BP 104/72 | HR 87 | Ht 72.0 in | Wt 238.0 lb

## 2023-03-31 DIAGNOSIS — Z794 Long term (current) use of insulin: Secondary | ICD-10-CM

## 2023-03-31 DIAGNOSIS — E782 Mixed hyperlipidemia: Secondary | ICD-10-CM | POA: Diagnosis not present

## 2023-03-31 DIAGNOSIS — E119 Type 2 diabetes mellitus without complications: Secondary | ICD-10-CM | POA: Diagnosis not present

## 2023-03-31 LAB — POCT GLYCOSYLATED HEMOGLOBIN (HGB A1C): Hemoglobin A1C: 6.4 % — AB (ref 4.0–5.6)

## 2023-03-31 MED ORDER — INSULIN PEN NEEDLE 31G X 5 MM MISC: 1.0000 | Freq: Four times a day (QID) | 3 refills | Status: DC

## 2023-03-31 MED ORDER — TIRZEPATIDE 5 MG/0.5ML ~~LOC~~ SOAJ: 5.0000 mg | SUBCUTANEOUS | 3 refills | Status: DC

## 2023-03-31 MED ORDER — DAPAGLIFLOZIN PROPANEDIOL 10 MG PO TABS: 10.0000 mg | ORAL_TABLET | Freq: Every day | ORAL | 3 refills | Status: DC

## 2023-03-31 MED ORDER — METFORMIN HCL 1000 MG PO TABS: 1000.0000 mg | ORAL_TABLET | Freq: Two times a day (BID) | ORAL | 3 refills | Status: DC

## 2023-03-31 MED ORDER — ROSUVASTATIN CALCIUM 40 MG PO TABS: 40.0000 mg | ORAL_TABLET | Freq: Every day | ORAL | 3 refills | Status: DC

## 2023-03-31 MED ORDER — HUMALOG KWIKPEN 200 UNIT/ML ~~LOC~~ SOPN: PEN_INJECTOR | SUBCUTANEOUS | 3 refills | Status: DC

## 2023-03-31 NOTE — Progress Notes (Signed)
Name: Valerie Schmitt  Age/ Sex: 58 y.o., female   MRN/ DOB: [id number], [birth year]     PCP: Debera Lat, PA-C   Reason for Endocrinology Evaluation: Type 2 Diabetes Mellitus  Initial Endocrine Consultative Visit: 04/26/2019    PATIENT IDENTIFIER: Valerie Schmitt is a 58 y.o. female with a past medical history of T2DM and Dyslipidemia. The patient has followed with Endocrinology clinic since 04/26/2019 for consultative assistance with management of her diabetes.  DIABETIC HISTORY:  Ms. Favorite was diagnosed with T2DM in 2016. She has been on Glimepiride in the past without reported intolerance.She moved from [place] 06/2018 and records were not available.  Her hemoglobin A1c was 12.9% on initial visit.  On her initial visit to our clinic, her A1c was 12.9%. She was already on MDI regimen , metformin and Ozempic which were continued.   SUBJECTIVE:   During the last visit (09/22/2022): A1c 5.9%     Today (03/31/2023): [name] is here for a follow up on diabetes management.  She checks her blood sugars occasionally preprandial . The patient has not had hypoglycemic episodes since the last clinic visit, she is symptomatic with these episodes    Denies nausea, vomiting  Denies constipation or diarrhea     HOME DIABETES REGIMEN:  Metformin 1000 mg BID Farxiga 10 mg daily  Ozempic 2 mg weekly  Basaglar 26 units daily Humalog  4  units with Brunch and 6 units with Supper  Correction scale : Humalog (BG-130/20)  Rosuvastatin 40 mg daily     Statin: yes ACE-I/ARB: no    CONTINUOUS GLUCOSE MONITORING RECORD INTERPRETATION    Dates of Recording:1/2-1/15/2025  Sensor description:dexcom  Results statistics:   CGM use % of time 64  Average and SD 140/38  Time in range 85 %  % Time Above 180 14  % Time above 250 1  % Time Below target 0      Glycemic patterns summary: optimal BG's through the night and most of the day   Hyperglycemic episodes   post supper   Hypoglycemic episodes occurred n/a  Overnight periods: optimal       DIABETIC COMPLICATIONS: Microvascular complications:   Denies: CKD, neuropathy, retinopathy  Last Eye Exam: Completed 12/2022  Macrovascular complications:  Denies: CAD, CVA, PVD   HISTORY:  Past Medical History:  Past Medical History:  Diagnosis Date   Allergy    Anxiety    Arthritis    Asthma    Depression    Diabetes mellitus without complication (HCC)    Ketoacidosis 03/2016   Sleep apnea    Thyroid disease    Past Surgical History:  Past Surgical History:  Procedure Laterality Date   BREAST SURGERY     COLONOSCOPY WITH PROPOFOL N/A 09/19/2021   Procedure: COLONOSCOPY WITH PROPOFOL;  Surgeon: Wyline Mood, MD;  [facility];  Service: Gastroenterology;  Laterality: N/A;  DM   HERNIA REPAIR     REDUCTION MAMMAPLASTY Bilateral 2003   TUBAL LIGATION     Social History:  reports that she has never smoked. She has never used smokeless tobacco. She reports that she does not currently use alcohol. She reports that she does not use drugs. Family History:  Family History  Problem Relation Age of Onset   Diabetes Father    Scoliosis Daughter    Autoimmune disease Daughter    Sleep apnea Daughter    Anxiety disorder Daughter    Depression Daughter  Personality disorder Daughter    Osteoporosis Maternal Grandmother    Alzheimer's disease Paternal Grandmother    Liver disease Paternal Grandfather    Breast cancer Neg Hx      HOME MEDICATIONS: Allergies as of 03/31/2023   No Known Allergies      Medication List        Accurate as of March 31, 2023  7:42 AM. If you have any questions, ask your nurse or doctor.          aspirin EC 81 MG tablet Take 81 mg by mouth daily. Swallow whole.   dapagliflozin propanediol 10 MG Tabs tablet Commonly known as: Farxiga Take 1 tablet (10 mg total) by mouth daily before breakfast.   Dexcom G6 Sensor Misc USE AS  DIRECTED   Dexcom G6 Transmitter Misc USE AS DIRECTED   DULoxetine 60 MG capsule Commonly known as: Cymbalta Take 2 capsules (120 mg total) by mouth daily.   HumaLOG KwikPen 200 UNIT/ML KwikPen Generic drug: insulin lispro Max daily 30 units with correction scale   Insulin Pen Needle 31G X 5 MM Misc 1 Device by Does not apply route in the morning, at noon, in the evening, and at bedtime.   levothyroxine 88 MCG tablet Commonly known as: SYNTHROID TAKE 1 TABLET BY MOUTH EVERY DAY BEFORE BREAKFAST   metFORMIN 1000 MG tablet Commonly known as: GLUCOPHAGE Take 1 tablet (1,000 mg total) by mouth 2 (two) times daily with a meal.   OneTouch UltraLink w/Device Kit by Does not apply route 3 (three) times daily.   Ozempic (2 MG/DOSE) 8 MG/3ML Sopn Generic drug: Semaglutide (2 MG/DOSE) Inject 2 mg into the skin once a week.   Rezvoglar KwikPen 100 UNIT/ML Sopn Generic drug: Insulin Glargine-aglr 26 Units by Other route daily in the afternoon.   rizatriptan 10 MG disintegrating tablet Commonly known as: MAXALT-MLT TAKE 1 TABLET BY MOUTH AS NEEDED FOR MIGRAINE. MAY REPEAT IN 2 HOURS IF NEEDED   rosuvastatin 40 MG tablet Commonly known as: CRESTOR Take 1 tablet (40 mg total) by mouth daily.   traZODone 100 MG tablet Commonly known as: DESYREL TAKE 2 TABLETS (200 MG TOTAL) BY MOUTH AT BEDTIME AS NEEDED FOR SLEEP         OBJECTIVE:   Vital Signs: BP 104/72 (BP Location: Left Arm, Patient Position: Sitting, Cuff Size: Normal)   Pulse 87   Ht 6' (1.829 m)   Wt 238 lb (108 kg)   SpO2 99%   BMI 32.28 kg/m   Wt Readings from Last 3 Encounters:  03/31/23 238 lb (108 kg)  10/29/22 242 lb 9.6 oz (110 kg)  09/22/22 238 lb (108 kg)     Exam: General: Pt appears well and is in NAD  Lungs: Clear with good BS bilat   Heart: RRR   Abdomen: soft, nontender  Extremities: No pretibial edema.   Neuro: MS is good with appropriate affect, pt is alert and Ox3      DM foot exam:  09/22/2022   The skin of the feet is intact  The pedal pulses are 2+ on right and 2+ on left. The sensation is intact to a screening 5.07, 10 gram monofilament bilaterally    DATA REVIEWED:  Lab Results  Component Value Date   HGBA1C 6.4 (A) 03/31/2023   HGBA1C 5.9 (A) 09/22/2022   HGBA1C 6.4 (A) 03/17/2022    Latest Reference Range & Units 03/31/23 08:07  Sodium 135 - 146 mmol/L 141  Potassium 3.5 -  5.3 mmol/L 4.5  Chloride 98 - 110 mmol/L 102  CO2 20 - 32 mmol/L 31  Glucose 65 - 99 mg/dL 161 (H)  BUN 7 - 25 mg/dL 17  Creatinine 0.96 - 0.45 mg/dL 4.09  Calcium 8.6 - 81.1 mg/dL 9.6  BUN/Creatinine Ratio 6 - 22 (calc) SEE NOTE:  Total CHOL/HDL Ratio <5.0 (calc) 2.3  Cholesterol <200 mg/dL 914  HDL Cholesterol > OR = 50 mg/dL 47 (L)  LDL Cholesterol (Calc) mg/dL (calc) 43  Non-HDL Cholesterol (Calc) <130 mg/dL (calc) 63  Triglycerides <150 mg/dL 782  (H): Data is abnormally high (L): Data is abnormally low  ASSESSMENT / PLAN / RECOMMENDATIONS:   1) Type 2 Diabetes Mellitus, Optimally  controlled, Without complications - Most recent A1c of 6.4 %. Goal A1c < 7.0 %.    - A1c remains optimal -We have opted to switch Ozempic to [facility]  -I did caution the patient regarding GI side effects as well as possible hypoglycemia early on because we are starting at a small dose. -Patient may contact office to increase Mounjaro if no side effects within the next 6-8 weeks  MEDICATIONS: - Continue Metformin 1000 mg TWice a day  - Continue Farxiga 10 mg daily  - Continue  Basaglar  26 units ONCE a day  - Change Humalog 4 units with brunch and 6 units with supper  - Continue Correction scale : Humalog (BG-130/20)   EDUCATION / INSTRUCTIONS: BG monitoring instructions: Patient is instructed to check her blood sugars 3 times a day, before each meal . Call [facility] if: BG persistently < 70  I reviewed the Rule of 15 for the treatment of hypoglycemia in detail with  the patient. Literature supplied.     2) Diabetic complications:  Eye: Does not have known diabetic retinopathy.  Neuro/ Feet: Does not have known diabetic peripheral neuropathy .  Renal: Patient does not have known baseline CKD. She   is not on an ACEI/ARB at present.   3) Dyslipidemia :   -I have switched atorvastatin 80 mg to rosuvastatin 40 mg daily and her lipid panel has improved -LDL is at 43 Mg/DL, will decrease rosuvastatin as below  Medication   Stop rosuvastatin 40 mg daily  Start rosuvastatin 20 mg daily    F/U in 4 months    Signed electronically by: Lyndle Herrlich, MD  [facility] Endocrinology  [facility] [address] [place] [place], [place] [address] Phone: [phone] FAX: [phone]   CC: Cherlynn Polo [address] [place] [address] Phone: [phone]  Fax: [phone]  Return to Endocrinology clinic as below: No future appointments.

## 2023-03-31 NOTE — Patient Instructions (Signed)
-   Switch Ozempic  to Mounjaro 5 mg weekly  - Continue Metformin  1000 mg twice daily  - Continue Farxiga  10 mg daily  - Continue  Basaglar   26 units daily  - Continue Humalog  4 units with Brunch and 6 with Supper  - Humalog  correctional insulin : ADD extra units on insulin  to your meal-time Humalog  dose if your blood sugars are higher than 150. Use the scale below to help guide you:   Blood sugar before meal Number of units to inject  Less than 150 0 unit  151 -  170 1 units  171 -  190 2 units  191 -  210 3 units  211 -  230 4 units  231 -  250 5 units  251 -  270 6 units  271 -  290 7 units  291 -  310 8 units  311 - 330 9 units         HOW TO TREAT LOW BLOOD SUGARS (Blood sugar LESS THAN 70 MG/DL) Please follow the RULE OF 15 for the treatment of hypoglycemia treatment (when your (blood sugars are less than 70 mg/dL)   STEP 1: Take 15 grams of carbohydrates when your blood sugar is low, which includes:  3-4 GLUCOSE TABS  OR 3-4 OZ OF JUICE OR REGULAR SODA OR ONE TUBE OF GLUCOSE GEL    STEP 2: RECHECK blood sugar in 15 MINUTES STEP 3: If your blood sugar is still low at the 15 minute recheck --> then, go back to STEP 1 and treat AGAIN with another 15 grams of carbohydrates.

## 2023-04-01 LAB — BASIC METABOLIC PANEL
BUN: 17 mg/dL (ref 7–25)
CO2: 31 mmol/L (ref 20–32)
Calcium: 9.6 mg/dL (ref 8.6–10.4)
Chloride: 102 mmol/L (ref 98–110)
Creat: 0.85 mg/dL (ref 0.50–1.03)
Glucose, Bld: 117 mg/dL — ABNORMAL HIGH (ref 65–99)
Potassium: 4.5 mmol/L (ref 3.5–5.3)
Sodium: 141 mmol/L (ref 135–146)

## 2023-04-01 LAB — MICROALBUMIN / CREATININE URINE RATIO
Creatinine, Urine: 97 mg/dL (ref 20–275)
Microalb Creat Ratio: 89 mg/g{creat} — ABNORMAL HIGH (ref <30)
Microalb, Ur: 8.6 mg/dL

## 2023-04-01 LAB — LIPID PANEL
Cholesterol: 110 mg/dL (ref <200)
HDL: 47 mg/dL — ABNORMAL LOW (ref >=50)
LDL Cholesterol (Calc): 43 mg/dL
Non-HDL Cholesterol (Calc): 63 mg/dL (ref <130)
Total CHOL/HDL Ratio: 2.3 (calc) (ref <5.0)
Triglycerides: 110 mg/dL (ref <150)

## 2023-04-01 MED ORDER — ROSUVASTATIN CALCIUM 20 MG PO TABS: 20.0000 mg | ORAL_TABLET | Freq: Every day | ORAL | 3 refills | Status: AC

## 2023-04-02 ENCOUNTER — Encounter: Payer: Self-pay | Admitting: Internal Medicine

## 2023-06-01 ENCOUNTER — Other Ambulatory Visit: Payer: Self-pay | Admitting: Physician Assistant

## 2023-06-19 ENCOUNTER — Encounter: Payer: Self-pay | Admitting: Internal Medicine

## 2023-06-21 MED ORDER — TIRZEPATIDE 7.5 MG/0.5ML ~~LOC~~ SOAJ: 7.5000 mg | SUBCUTANEOUS | 3 refills | Status: DC

## 2023-07-06 ENCOUNTER — Other Ambulatory Visit: Payer: Self-pay | Admitting: Physician Assistant

## 2023-07-07 ENCOUNTER — Other Ambulatory Visit: Payer: Self-pay | Admitting: Physician Assistant

## 2023-07-07 MED ORDER — LEVOTHYROXINE SODIUM 88 MCG PO TABS: 88.0000 ug | ORAL_TABLET | Freq: Every day | ORAL | 1 refills | Status: DC

## 2023-07-07 NOTE — Telephone Encounter (Signed)
 Requested medication (s) are due for refill today -yes  Requested medication (s) are on the active medication list -yes  Future visit scheduled -no  Last refill: 02/24/23 #90  Notes to clinic: fails lab protocol- over 1 year-08/06/21  Requested Prescriptions  Pending Prescriptions Disp Refills   levothyroxine  (SYNTHROID ) 88 MCG tablet [Pharmacy Med Name: LEVOTHYROXINE  88 MCG TABLET] 90 tablet 0    Sig: TAKE 1 TABLET BY MOUTH EVERY DAY BEFORE BREAKFAST     Endocrinology:  Hypothyroid Agents Failed - 07/07/2023  2:04 PM      Failed - TSH in normal range and within 360 days    TSH  Date Value Ref Range Status  08/06/2021 1.960 0.450 - 4.500 uIU/mL Final         Failed - Valid encounter within last 12 months    Recent Outpatient Visits   None               Requested Prescriptions  Pending Prescriptions Disp Refills   levothyroxine  (SYNTHROID ) 88 MCG tablet [Pharmacy Med Name: LEVOTHYROXINE  88 MCG TABLET] 90 tablet 0    Sig: TAKE 1 TABLET BY MOUTH EVERY DAY BEFORE BREAKFAST     Endocrinology:  Hypothyroid Agents Failed - 07/07/2023  2:04 PM      Failed - TSH in normal range and within 360 days    TSH  Date Value Ref Range Status  08/06/2021 1.960 0.450 - 4.500 uIU/mL Final         Failed - Valid encounter within last 12 months    Recent Outpatient Visits   None

## 2023-07-07 NOTE — Telephone Encounter (Signed)
 Patient request for refill of synthroid  was declined due to failed protocol.-needing appointment. Patient is scheduled for physical with [name] on August 17, 2023. She will be out of Synthroid  beginning 07/08/23. Please advise if this can be refilled or recommendations.   Copied from CRM [id number]. Topic: Clinical - Prescription Issue >> Jul 07, 2023  2:42 PM Valerie Schmitt wrote: Reason for CRM: Pt's levothyroxine  (SYNTHROID ) 88 MCG tablet was requested and denied because the pt needed an appt. Pt is scheduled but not until 06/03. Pt will be out of this medication come tomorrow and not sure if she can be out of this medication for that long and what will happen. Pt asking if she can get an emergency fill until the appt? Please follow up [phone].

## 2023-07-14 ENCOUNTER — Encounter: Payer: Self-pay | Admitting: *Deleted

## 2023-07-14 ENCOUNTER — Ambulatory Visit
Admission: EM | Admit: 2023-07-14 | Discharge: 2023-07-14 | Disposition: A | Discharge status: Home / Self Care | Attending: Emergency Medicine | Admitting: Emergency Medicine

## 2023-07-14 DIAGNOSIS — R21 Rash and other nonspecific skin eruption: Secondary | ICD-10-CM | POA: Diagnosis not present

## 2023-07-14 MED ORDER — METHYLPREDNISOLONE ACETATE 80 MG/ML IJ SUSP
60.0000 mg | Freq: Once | INTRAMUSCULAR | Status: AC
  Administered 2023-07-14: 60 mg via INTRAMUSCULAR

## 2023-07-14 MED ORDER — PREDNISONE 10 MG (21) PO TBPK: ORAL_TABLET | Freq: Every day | ORAL | 0 refills | Status: DC

## 2023-07-14 NOTE — ED Triage Notes (Signed)
 Patient states 2 days of rash on lower back, panty line, lower abdomen, very itchy, unsure what triggered it.  No new food, soap, detergent etc, hasn't been outside

## 2023-07-14 NOTE — Discharge Instructions (Signed)
 Today you are being treated for a rash that appears inflammatory, at this time no signs of infection  You have been given an injection of steroids today in the office today to help reduce the inflammatory process that occurs with this rash which will help minimize your itching as well as begin to clear  Starting tomorrow take prednisone every morning with food as directed, to continue the above process  You may continue use of topical calamine or Benadryl cream to help manage itching, you may also continue oral Benadryl  Please avoid long exposures to heat such as a hot steamy shower or being outside as this may cause further irritation to your rash  You may follow-up with his urgent care as needed if symptoms persist or worsen

## 2023-07-14 NOTE — ED Provider Notes (Signed)
 Arlander Bellman    CSN: [id number] Arrival date & time: 07/14/23  1816      History   Chief Complaint Chief Complaint  Patient presents with   Rash    HPI Valerie Schmitt is a 58 y.o. female.   Patient presents for evaluation of a erythematous pruritic rash present to the trunk of the body, the bilateral groin and upper thighs present for 2 days.  Initially started on the back but then spread.  Endorses pruritus is severe causing her to clot her skin.  Has attempted use of cortisone and Benadryl which has provided minimal relief.  Denies changes in toiletries, diet, medications or recent travel.  No other close contact has similar symptoms.  Denies exposure to wooded or relieving areas.  Past Medical History:  Diagnosis Date   Allergy    Anxiety    Arthritis    Asthma    Depression    Diabetes mellitus without complication (HCC)    Ketoacidosis 03/2016   Sleep apnea    Thyroid disease     Patient Active Problem List   Diagnosis Date Noted   OSA (obstructive sleep apnea) 08/14/2022   Type 2 diabetes mellitus without complication, with long-term current use of insulin  (HCC) 03/17/2022   Tinnitus of both ears 02/10/2022   Migraine with aura and without status migrainosus, not intractable 08/06/2021   Primary insomnia 08/06/2021   Varicose veins with pain 11/20/2020   Elevated hemoglobin (HCC) 05/01/2019   Hyperlipidemia associated with type 2 diabetes mellitus (HCC) 04/26/2019   Depression, recurrent (HCC) 04/20/2019   Glucose found in urine on examination 04/20/2019   Hypothyroidism 04/20/2019   History of use of contraceptive intrauterine device (IUD) 04/20/2019    Past Surgical History:  Procedure Laterality Date   BREAST SURGERY     COLONOSCOPY WITH PROPOFOL  N/A 09/19/2021   Procedure: COLONOSCOPY WITH PROPOFOL ;  Surgeon: Luke Salaam, MD;  Location: [facility] ENDOSCOPY;  Service: Gastroenterology;  Laterality: N/A;  DM   HERNIA REPAIR     REDUCTION  MAMMAPLASTY Bilateral 2003   TUBAL LIGATION      OB History   No obstetric history on file.      Home Medications    Prior to Admission medications   Medication Sig Start Date End Date Taking? Authorizing Provider  predniSONE (STERAPRED UNI-PAK 21 TAB) 10 MG (21) TBPK tablet Take by mouth daily. Take 6 tabs by mouth daily  for 1 days, then 5 tabs for 1 days, then 4 tabs for 1 days, then 3 tabs for 1 days, 2 tabs for 1 days, then 1 tab by mouth daily for 1 days 07/14/23  Yes Alayne Allis R, NP  aspirin EC 81 MG tablet Take 81 mg by mouth daily. Swallow whole.    [provider]  Blood Glucose Monitoring Suppl (ONETOUCH ULTRALINK) w/Device KIT by Does not apply route 3 (three) times daily.    [provider]  dapagliflozin  propanediol (FARXIGA ) 10 MG TABS tablet Take 1 tablet (10 mg total) by mouth daily before breakfast. 03/31/23   Shamleffer, Ibtehal Jaralla, MD  DULoxetine  (CYMBALTA ) 60 MG capsule Take 2 capsules (120 mg total) by mouth daily. 08/14/22   Trenton Frock, PA-C  Insulin  Glargine-aglr (REZVOGLAR  KWIKPEN) 100 UNIT/ML SOPN 26 Units by Other route daily in the afternoon. 12/14/22   Shamleffer, Ibtehal Jaralla, MD  insulin  lispro (HUMALOG  KWIKPEN) 200 UNIT/ML KwikPen Max daily 30 units with correction scale 03/31/23   Shamleffer, Julian Obey, MD  Insulin   Pen Needle 31G X 5 MM MISC 1 Device by Does not apply route in the morning, at noon, in the evening, and at bedtime. 03/31/23   Shamleffer, Ibtehal Jaralla, MD  levothyroxine  (SYNTHROID ) 88 MCG tablet Take 1 tablet (88 mcg total) by mouth daily before breakfast. 07/07/23   Ostwalt, Janna, PA-C  metFORMIN  (GLUCOPHAGE ) 1000 MG tablet Take 1 tablet (1,000 mg total) by mouth 2 (two) times daily with a meal. 03/31/23   Shamleffer, Julian Obey, MD  rizatriptan  (MAXALT -MLT) 10 MG disintegrating tablet TAKE 1 TABLET BY MOUTH AS NEEDED FOR MIGRAINE. MAY REPEAT IN 2 HOURS IF NEEDED 11/03/21   Drubel, Heidi Llamas, PA-C   rosuvastatin  (CRESTOR ) 20 MG tablet Take 1 tablet (20 mg total) by mouth daily. 04/01/23   Shamleffer, Ibtehal Jaralla, MD  tirzepatide [facility]) 7.5 MG/0.5ML Pen Inject 7.5 mg into the skin once a week. 06/21/23   Shamleffer, Ibtehal Jaralla, MD  traZODone  (DESYREL ) 100 MG tablet TAKE 2 TABLETS (200 MG TOTAL) BY MOUTH AT BEDTIME AS NEEDED FOR SLEEP 08/03/22   Trenton Frock, PA-C    Family History Family History  Problem Relation Age of Onset   Diabetes Father    Scoliosis Daughter    Autoimmune disease Daughter    Sleep apnea Daughter    Anxiety disorder Daughter    Depression Daughter    Personality disorder Daughter    Osteoporosis Maternal Grandmother    Alzheimer's disease Paternal Grandmother    Liver disease Paternal Grandfather    Breast cancer Neg Hx     Social History Social History   Tobacco Use   Smoking status: Never   Smokeless tobacco: Never  Vaping Use   Vaping status: Never Used  Substance Use Topics   Alcohol use: Not Currently   Drug use: Never     Allergies   Patient has no known allergies.   Review of Systems Review of Systems  Skin:  Positive for rash.     Physical Exam Triage Vital Signs ED Triage Vitals  Encounter Vitals Group     BP 07/14/23 1830 105/67     Systolic BP Percentile --      Diastolic BP Percentile --      Pulse Rate 07/14/23 1830 89     Resp 07/14/23 1830 20     Temp 07/14/23 1830 98 F (36.7 C)     Temp Source 07/14/23 1830 Oral     SpO2 07/14/23 1830 97 %     Weight 07/14/23 1828 225 lb (102.1 kg)     Height 07/14/23 1828 6' (1.829 m)     Head Circumference --      Peak Flow --      Pain Score 07/14/23 1828 0     Pain Loc --      Pain Education --      Exclude from Growth Chart --    No data found.  Updated Vital Signs BP 105/67 (BP Location: Left Arm)   Pulse 89   Temp 98 F (36.7 C) (Oral)   Resp 20   Ht 6' (1.829 m)   Wt 225 lb (102.1 kg)   SpO2 97%   BMI 30.52 kg/m   Visual Acuity Right Eye  Distance:   Left Eye Distance:   Bilateral Distance:    Right Eye Near:   Left Eye Near:    Bilateral Near:     Physical Exam Constitutional:      Appearance: Normal appearance.  Eyes:  Extraocular Movements: Extraocular movements intact.  Pulmonary:     Effort: Pulmonary effort is normal.  Skin:    Comments: Erythematous maculopapular rash present to the back, the bilateral groin  and lower extremities  Neurological:     Mental Status: She is alert and oriented to person, place, and time.      UC Treatments / Results  Labs (all labs ordered are listed, but only abnormal results are displayed) Labs Reviewed - No data to display  EKG   Radiology No results found.  Procedures Procedures (including critical care time)  Medications Ordered in UC Medications  methylPREDNISolone acetate (DEPO-MEDROL) injection 60 mg (60 mg Intramuscular Given 07/14/23 1847)    Initial Impression / Assessment and Plan / UC Course  I have reviewed the triage vital signs and the nursing notes.  Pertinent labs & imaging results that were available during my care of the patient were reviewed by me and considered in my medical decision making (see chart for details).  Rash  Presentation appears inflammatory no signs of infection, discussed with patient, unknown etiology, stable, methylprednisolone IM given and prescribed prednisone taper, recommended supportive care through over-the-counter antihistamines, advise follow-up for any persisting or worsening symptoms, given walker referral to allergist as patient has history of hives that occurred approximately 3 years ago in the 90s without cause Final Clinical Impressions(s) / UC Diagnoses   Final diagnoses:  Rash     Discharge Instructions      Today you are being treated for a rash that appears inflammatory, at this time no signs of infection  You have been given an injection of steroids today in the office today to help reduce the  inflammatory process that occurs with this rash which will help minimize your itching as well as begin to clear  Starting tomorrow take prednisone every morning with food as directed, to continue the above process  You may continue use of topical calamine or Benadryl cream to help manage itching, you may also continue oral Benadryl  Please avoid long exposures to heat such as a hot steamy shower or being outside as this may cause further irritation to your rash  You may follow-up with his urgent care as needed if symptoms persist or worsen    ED Prescriptions     Medication Sig Dispense Auth. Provider   predniSONE (STERAPRED UNI-PAK 21 TAB) 10 MG (21) TBPK tablet Take by mouth daily. Take 6 tabs by mouth daily  for 1 days, then 5 tabs for 1 days, then 4 tabs for 1 days, then 3 tabs for 1 days, 2 tabs for 1 days, then 1 tab by mouth daily for 1 days 21 tablet Cindy Fullman, Maybelle Spatz, NP      PDMP not reviewed this encounter.   Reena Canning, [place] 07/14/23 [phone]

## 2023-08-03 ENCOUNTER — Other Ambulatory Visit: Payer: Self-pay | Admitting: Physician Assistant

## 2023-08-03 DIAGNOSIS — F5101 Primary insomnia: Secondary | ICD-10-CM

## 2023-08-04 NOTE — Telephone Encounter (Signed)
 Will reassess the need for trazodone  during your follow-up appointment

## 2023-08-06 ENCOUNTER — Encounter: Payer: Self-pay | Admitting: Internal Medicine

## 2023-08-06 ENCOUNTER — Ambulatory Visit (INDEPENDENT_AMBULATORY_CARE_PROVIDER_SITE_OTHER): Payer: Managed Care, Other (non HMO) | Admitting: Internal Medicine

## 2023-08-06 ENCOUNTER — Other Ambulatory Visit: Payer: Self-pay | Admitting: Internal Medicine

## 2023-08-06 VITALS — BP 110/68 | HR 90 | Ht 72.0 in | Wt 232.0 lb

## 2023-08-06 DIAGNOSIS — R809 Proteinuria, unspecified: Secondary | ICD-10-CM | POA: Diagnosis not present

## 2023-08-06 DIAGNOSIS — E1129 Type 2 diabetes mellitus with other diabetic kidney complication: Secondary | ICD-10-CM

## 2023-08-06 DIAGNOSIS — Z794 Long term (current) use of insulin: Secondary | ICD-10-CM | POA: Diagnosis not present

## 2023-08-06 DIAGNOSIS — E119 Type 2 diabetes mellitus without complications: Secondary | ICD-10-CM

## 2023-08-06 LAB — POCT GLYCOSYLATED HEMOGLOBIN (HGB A1C): Hemoglobin A1C: 6.4 % — AB (ref 4.0–5.6)

## 2023-08-06 MED ORDER — TIRZEPATIDE 10 MG/0.5ML ~~LOC~~ SOAJ
10.0000 mg | SUBCUTANEOUS | 3 refills | Status: DC
Start: 2023-08-06 — End: 2023-08-16

## 2023-08-06 MED ORDER — DAPAGLIFLOZIN PROPANEDIOL 10 MG PO TABS: 10.0000 mg | ORAL_TABLET | Freq: Every day | ORAL | 3 refills | Status: DC

## 2023-08-06 NOTE — Patient Instructions (Addendum)
-   Increase Mounjaro 10 mg weekly  - Continue Metformin  1000 mg twice daily  - Continue Farxiga  10 mg daily  - Decrease insulin  glargine   24 units daily  - Continue Humalog  4 units with Brunch and 6 with Supper  - Humalog  correctional insulin : ADD extra units on insulin  to your meal-time Humalog  dose if your blood sugars are higher than 150. Use the scale below to help guide you:   Blood sugar before meal Number of units to inject  Less than 155 0 unit  156 - 180 1 units  181 - 205 2 units  206 - 230 3 units  231- 255 4 units  256 - 280 5 units  281 - 305 6 units  306 - 330 7 units        HOW TO TREAT LOW BLOOD SUGARS (Blood sugar LESS THAN 70 MG/DL) Please follow the RULE OF 15 for the treatment of hypoglycemia treatment (when your (blood sugars are less than 70 mg/dL)   STEP 1: Take 15 grams of carbohydrates when your blood sugar is low, which includes:  3-4 GLUCOSE TABS  OR 3-4 OZ OF JUICE OR REGULAR SODA OR ONE TUBE OF GLUCOSE GEL    STEP 2: RECHECK blood sugar in 15 MINUTES STEP 3: If your blood sugar is still low at the 15 minute recheck --> then, go back to STEP 1 and treat AGAIN with another 15 grams of carbohydrates.

## 2023-08-06 NOTE — Progress Notes (Unsigned)
 Name: Valerie Schmitt  Age/ Sex: 58 y.o., female   MRN/ DOB: [id number], 04-Oct-1965     PCP: Ostwalt, Janna, PA-C   Reason for Endocrinology Evaluation: Type 2 Diabetes Mellitus  Initial Endocrine Consultative Visit: 04/26/2019    PATIENT IDENTIFIER: Valerie Schmitt is a 58 y.o. female with a past medical history of T2DM and Dyslipidemia. The patient has followed with Endocrinology clinic since 04/26/2019 for consultative assistance with management of her diabetes.  DIABETIC HISTORY:  Valerie Schmitt was diagnosed with T2DM in 2016. She has been on Glimepiride in the past without reported intolerance.She moved from [place]  06/2018 and records were not available.  Her hemoglobin A1c was 12.9% on initial visit.  On her initial visit to our clinic, her A1c was 12.9%. She was already on MDI regimen , metformin  and Ozempic  which were continued.    Switched Ozempic  to mounjaro 03/2023  SUBJECTIVE:   During the last visit (03/31/2023): A1c 6.4%     Today (08/06/2023): [name] is here for a follow up on diabetes management.  She checks her blood sugars occasionally preprandial . The patient has had hypoglycemic episodes since the last clinic visit, she is symptomatic with these episodes    Denies nausea, vomiting  Denies constipation or diarrhea     HOME DIABETES REGIMEN:  Metformin  1000 mg BID Farxiga  10 mg daily  Mounjaro 7.5 mg weekly  Basaglar  26 units daily Humalog   4 units with Brunch and 6 units with Supper  Correction scale : Humalog  (BG-130/20)  Rosuvastatin  20 mg daily     Statin: yes ACE-I/ARB: no    CONTINUOUS GLUCOSE MONITORING RECORD INTERPRETATION    Dates of Recording:5/10-5/23/2025  Sensor description:dexcom  Results statistics:   CGM use % of time 84  Average and SD 145/45  Time in range 80 %  % Time Above 180 16  % Time above 250 3  % Time Below target 1      Glycemic patterns summary: optimal BG's through the night and  fluctuate during the day Hyperglycemic episodes postprandial  Hypoglycemic episodes occurred n/a  Overnight periods: optimal       DIABETIC COMPLICATIONS: Microvascular complications:   Denies: CKD, neuropathy, retinopathy  Last Eye Exam: Completed 12/2022  Macrovascular complications:  Denies: CAD, CVA, PVD   HISTORY:  Past Medical History:  Past Medical History:  Diagnosis Date   Allergy    Anxiety    Arthritis    Asthma    Depression    Diabetes mellitus without complication (HCC)    Ketoacidosis 03/2016   Sleep apnea    Thyroid disease    Past Surgical History:  Past Surgical History:  Procedure Laterality Date   BREAST SURGERY     COLONOSCOPY WITH PROPOFOL  N/A 09/19/2021   Procedure: COLONOSCOPY WITH PROPOFOL ;  Surgeon: Luke Salaam, MD;  Location: [facility] ENDOSCOPY;  Service: Gastroenterology;  Laterality: N/A;  DM   HERNIA REPAIR     REDUCTION MAMMAPLASTY Bilateral 2003   TUBAL LIGATION     Social History:  reports that she has never smoked. She has never used smokeless tobacco. She reports that she does not currently use alcohol. She reports that she does not use drugs. Family History:  Family History  Problem Relation Age of Onset   Diabetes Father    Scoliosis Daughter    Autoimmune disease Daughter    Sleep apnea Daughter    Anxiety disorder Daughter    Depression Daughter  Personality disorder Daughter    Osteoporosis Maternal Grandmother    Alzheimer's disease Paternal Grandmother    Liver disease Paternal Grandfather    Breast cancer Neg Hx      HOME MEDICATIONS: Allergies as of 08/06/2023   No Known Allergies      Medication List        Accurate as of Aug 06, 2023  8:07 AM. If you have any questions, ask your nurse or doctor.          aspirin EC 81 MG tablet Take 81 mg by mouth daily. Swallow whole.   dapagliflozin  propanediol 10 MG Tabs tablet Commonly known as: Farxiga  Take 1 tablet (10 mg total) by mouth daily before  breakfast.   Dexcom G6 Sensor Misc Apply topically as directed.   Dexcom G6 Transmitter Misc   DULoxetine  60 MG capsule Commonly known as: Cymbalta  Take 2 capsules (120 mg total) by mouth daily.   HumaLOG  KwikPen 200 UNIT/ML KwikPen Generic drug: insulin  lispro Max daily 30 units with correction scale   Insulin  Pen Needle 31G X 5 MM Misc 1 Device by Does not apply route in the morning, at noon, in the evening, and at bedtime.   levothyroxine  88 MCG tablet Commonly known as: SYNTHROID  Take 1 tablet (88 mcg total) by mouth daily before breakfast.   metFORMIN  1000 MG tablet Commonly known as: GLUCOPHAGE  Take 1 tablet (1,000 mg total) by mouth 2 (two) times daily with a meal.   OneTouch UltraLink w/Device Kit by Does not apply route 3 (three) times daily.   predniSONE  10 MG tablet Commonly known as: DELTASONE  Take by mouth. What changed: Another medication with the same name was removed. Continue taking this medication, and follow the directions you see here. Changed by: Camilla Cedar Rufus Cypert   Rezvoglar  KwikPen 100 UNIT/ML Sopn Generic drug: Insulin  Glargine-aglr 26 Units by Other route daily in the afternoon.   rizatriptan  10 MG disintegrating tablet Commonly known as: MAXALT -MLT TAKE 1 TABLET BY MOUTH AS NEEDED FOR MIGRAINE. MAY REPEAT IN 2 HOURS IF NEEDED   rosuvastatin  20 MG tablet Commonly known as: Crestor  Take 1 tablet (20 mg total) by mouth daily.   tirzepatide 7.5 MG/0.5ML Pen Commonly known as: MOUNJARO Inject 7.5 mg into the skin once a week.   traZODone  100 MG tablet Commonly known as: DESYREL  TAKE 2 TABLETS (200 MG TOTAL) BY MOUTH AT BEDTIME AS NEEDED FOR SLEEP         OBJECTIVE:   Vital Signs: BP 110/68 (BP Location: Left Arm, Patient Position: Sitting)   Pulse 90   Ht 6' (1.829 m)   Wt 232 lb (105.2 kg)   SpO2 97%   BMI 31.46 kg/m   Wt Readings from Last 3 Encounters:  08/06/23 232 lb (105.2 kg)  07/14/23 225 lb (102.1 kg)  03/31/23  238 lb (108 kg)     Exam: General: Pt appears well and is in NAD  Lungs: Clear with good BS bilat   Heart: RRR   Abdomen: soft, nontender  Extremities: No pretibial edema.   Neuro: MS is good with appropriate affect, pt is alert and Ox3      DM foot exam: 08/06/2023   The skin of the feet is intact  The pedal pulses are 2+ on right and 2+ on left. The sensation is intact to a screening 5.07, 10 gram monofilament bilaterally    DATA REVIEWED:  Lab Results  Component Value Date   HGBA1C 6.4 (A) 08/06/2023  HGBA1C 6.4 (A) 03/31/2023   HGBA1C 5.9 (A) 09/22/2022    Latest Reference Range & Units 03/31/23 08:07  Sodium 135 - 146 mmol/L 141  Potassium 3.5 - 5.3 mmol/L 4.5  Chloride 98 - 110 mmol/L 102  CO2 20 - 32 mmol/L 31  Glucose 65 - 99 mg/dL 295 (H)  BUN 7 - 25 mg/dL 17  Creatinine 2.84 - 1.32 mg/dL 4.40  Calcium  8.6 - 10.4 mg/dL 9.6  BUN/Creatinine Ratio 6 - 22 (calc) SEE NOTE:  Total CHOL/HDL Ratio <5.0 (calc) 2.3  Cholesterol <200 mg/dL 102  HDL Cholesterol > OR = 50 mg/dL 47 (L)  LDL Cholesterol (Calc) mg/dL (calc) 43  Non-HDL Cholesterol (Calc) <130 mg/dL (calc) 63  Triglycerides <150 mg/dL 725  (H): Data is abnormally high (L): Data is abnormally low  ASSESSMENT / PLAN / RECOMMENDATIONS:   1) Type 2 Diabetes Mellitus, Optimally  controlled, With Microalbuminuria complications - Most recent A1c of 6.4 %. Goal A1c < 7.0 %.    - A1c remains optimal - Patient has been noted with hypoglycemia yesterday, this is due to the fact that she took prandial insulin  after the meal, I did advise the patient that if she misses prandial insulin  with a meal, she should not take it later as this will predispose her to hypoglycemia and should wait until the next meal and use correction scale for Premeal hyperglycemia -I will also adjust her sensitivity factor from 20 to 25 due to hypoglycemia -Tolerating Mounjaro, will increase   MEDICATIONS: - Continue Metformin  1000 mg  TWice a day  - Continue Farxiga  10 mg daily  -Increase Mounjaro 10 mg weekly - Decrease Basaglar   24 units ONCE a day  - Continue Humalog  4 units with brunch and 6 units with supper  - Continue Correction scale : Humalog  (BG-130/20)   EDUCATION / INSTRUCTIONS: BG monitoring instructions: Patient is instructed to check her blood sugars 3 times a day, before each meal . Call [facility] if: BG persistently < 70  I reviewed the Rule of 15 for the treatment of hypoglycemia in detail with the patient. Literature supplied.     2) Diabetic complications:  Eye: Does not have known diabetic retinopathy.  Neuro/ Feet: Does not have known diabetic peripheral neuropathy .  Renal: Patient does not have known baseline CKD. She   is not on an ACEI/ARB at present.   3) Dyslipidemia :   -I have switched atorvastatin  80 mg to rosuvastatin  40 mg daily and her lipid panel has improved -LDL is at 43 Mg/DL, will decrease rosuvastatin  as below  Medication    rosuvastatin  20 mg daily  4) Microalbuminuria:  - MA/CR ratio ***  F/U in 6 months    Signed electronically by: Natale Bail, MD  [facility] Endocrinology  [facility] [address] [place] [place], [place] [address] Phone: [phone] FAX: [phone]   CC: Madilyn Schillings [address] [place] [address] Phone: [phone]  Fax: [phone]  Return to Endocrinology clinic as below: Future Appointments  Date Time Provider Department Center  08/19/2023 11:00 AM Ostwalt, Monalisa Angles, PA-C [id number]

## 2023-08-07 LAB — MICROALBUMIN / CREATININE URINE RATIO
Creatinine, Urine: 83 mg/dL (ref 20–275)
Microalb Creat Ratio: 166 mg/g{creat} — ABNORMAL HIGH (ref <30)
Microalb, Ur: 13.8 mg/dL

## 2023-08-10 ENCOUNTER — Ambulatory Visit: Payer: Self-pay | Admitting: Internal Medicine

## 2023-08-10 DIAGNOSIS — R809 Proteinuria, unspecified: Secondary | ICD-10-CM | POA: Insufficient documentation

## 2023-08-10 DIAGNOSIS — E1129 Type 2 diabetes mellitus with other diabetic kidney complication: Secondary | ICD-10-CM | POA: Insufficient documentation

## 2023-08-10 MED ORDER — LOSARTAN POTASSIUM 25 MG PO TABS
25.0000 mg | ORAL_TABLET | Freq: Every day | ORAL | 3 refills | Status: AC
Start: 2023-08-10 — End: ?

## 2023-08-11 ENCOUNTER — Other Ambulatory Visit (HOSPITAL_COMMUNITY): Payer: Self-pay

## 2023-08-11 ENCOUNTER — Telehealth: Payer: Self-pay | Admitting: Pharmacy Technician

## 2023-08-11 NOTE — Telephone Encounter (Signed)
 Pharmacy Patient Advocate Encounter   Received notification from CoverMyMeds that prior authorization for Mounjaro 10MG /0.5ML auto-injectors is required/requested.   Insurance verification completed.   The patient is insured through [employer] .   Per test claim: PA required; PA submitted to above mentioned insurance via CoverMyMeds Key/confirmation #/EOC [id number] Status is pending

## 2023-08-16 ENCOUNTER — Other Ambulatory Visit: Payer: Self-pay | Admitting: Internal Medicine

## 2023-08-16 ENCOUNTER — Other Ambulatory Visit (HOSPITAL_COMMUNITY): Payer: Self-pay

## 2023-08-16 NOTE — Telephone Encounter (Signed)
 Pharmacy Patient Advocate Encounter  Received notification from [employer] that Prior Authorization for Mounjaro  10MG /0.5ML auto-injectors has been APPROVED from 08/11/2023 to 08/13/2024. Ran test claim, Copay is $0.00. This test claim was processed through [facility]- copay amounts may vary at other pharmacies due to pharmacy/plan contracts, or as the patient moves through the different stages of their insurance plan.   PA #/Case ID/Reference #: [id number]

## 2023-08-17 ENCOUNTER — Encounter: Admitting: Physician Assistant

## 2023-08-19 ENCOUNTER — Encounter: Payer: Self-pay | Admitting: Physician Assistant

## 2023-08-19 ENCOUNTER — Ambulatory Visit: Admitting: Physician Assistant

## 2023-08-19 VITALS — BP 107/69 | HR 85 | Resp 16 | Ht 72.0 in | Wt 222.0 lb

## 2023-08-19 DIAGNOSIS — E785 Hyperlipidemia, unspecified: Secondary | ICD-10-CM

## 2023-08-19 DIAGNOSIS — B353 Tinea pedis: Secondary | ICD-10-CM | POA: Diagnosis not present

## 2023-08-19 DIAGNOSIS — Z0001 Encounter for general adult medical examination with abnormal findings: Secondary | ICD-10-CM

## 2023-08-19 DIAGNOSIS — Z794 Long term (current) use of insulin: Secondary | ICD-10-CM

## 2023-08-19 DIAGNOSIS — Z0000 Encounter for general adult medical examination without abnormal findings: Secondary | ICD-10-CM | POA: Insufficient documentation

## 2023-08-19 DIAGNOSIS — B351 Tinea unguium: Secondary | ICD-10-CM | POA: Diagnosis not present

## 2023-08-19 DIAGNOSIS — E039 Hypothyroidism, unspecified: Secondary | ICD-10-CM

## 2023-08-19 DIAGNOSIS — E1169 Type 2 diabetes mellitus with other specified complication: Secondary | ICD-10-CM

## 2023-08-19 NOTE — Progress Notes (Signed)
 Complete physical exam  Patient: Valerie Schmitt   DOB: 1965-08-03   58 y.o. Female  MRN: [id number] Visit Date: 08/19/2023  Today's healthcare provider: Blane Bunting, PA-C   Chief Complaint  Patient presents with   CPE   Annual Exam    CPE/.. Toe fungus, memory loss   Subjective    Valerie Schmitt is a 58 y.o. female who presents today for a complete physical exam.     Discussed the use of AI scribe software for clinical note transcription with the patient, who gave verbal consent to proceed.  History of Present Illness Valerie Schmitt is a 58 year old female with diabetes who presents with a fungal infection in her big toe.  She has a fungal infection in her big toe, with a hole in the toenail and separation of the nail. The area is dry, and she maintains moisture. She has not used any antifungal treatments. She recalls a previous incident where her toenail was removed after trauma.  She has diabetes and recently visited her endocrinologist, noting weight loss and good dietary habits. She exercises regularly by walking her dogs and reports adequate sleep. She has no chest pain, shortness of breath, or bowel movement issues.  She experiences memory loss, with episodes of forgetting steps in daily tasks. A scan a year ago revealed white lesions and a larger one, indicative of a small stroke.  She is on thyroid medication but has not had recent thyroid imaging. She sometimes experiences neck discomfort and occasional difficulty swallowing, without significant breathing issues.    Last depression screening scores    08/14/2022    8:59 AM 08/06/2021    9:30 AM 08/05/2020    1:37 PM  PHQ 2/9 Scores  PHQ - 2 Score 0 1 0  PHQ- 9 Score 2 4    Last fall risk screening    08/14/2022    8:59 AM  Fall Risk   Falls in the past year? 1  Risk for fall due to : History of fall(s)  Follow up Falls evaluation completed   Last Audit-C alcohol use screening     08/14/2022    9:00 AM  Alcohol Use Disorder Test (AUDIT)  2. How many drinks containing alcohol do you have on a typical day when you are drinking? 0  3. How often do you have six or more drinks on one occasion? 0   A score of 3 or more in women, and 4 or more in men indicates increased risk for alcohol abuse, EXCEPT if all of the points are from question 1   Past Medical History:  Diagnosis Date   Allergy    Anxiety    Arthritis    Asthma    Depression    Diabetes mellitus without complication (HCC)    Ketoacidosis 03/2016   Sleep apnea    Thyroid disease    Past Surgical History:  Procedure Laterality Date   BREAST SURGERY     COLONOSCOPY WITH PROPOFOL  N/A 09/19/2021   Procedure: COLONOSCOPY WITH PROPOFOL ;  Surgeon: Luke Salaam, MD;  Location: [facility] ENDOSCOPY;  Service: Gastroenterology;  Laterality: N/A;  DM   HERNIA REPAIR     REDUCTION MAMMAPLASTY Bilateral 2003   TUBAL LIGATION     Social History   Socioeconomic History   Marital status: Divorced    Spouse name: Not on file   Number of children: Not on file   Years of education: Not on file  Highest education level: Not on file  Occupational History   Not on file  Tobacco Use   Smoking status: Never   Smokeless tobacco: Never  Vaping Use   Vaping status: Never Used  Substance and Sexual Activity   Alcohol use: Not Currently   Drug use: Never   Sexual activity: Not Currently    Birth control/protection: None  Other Topics Concern   Not on file  Social History Narrative   Not on file   Social Drivers of Health   Financial Resource Strain: Not on file  Food Insecurity: Not on file  Transportation Needs: Not on file  Physical Activity: Not on file  Stress: Not on file  Social Connections: Not on file  Intimate Partner Violence: Not on file   Family Status  Relation Name Status   Father  Deceased   Daughter  (Not Specified)   MGM  (Not Specified)   PGM  (Not Specified)   PGF  (Not Specified)   Neg  Hx  (Not Specified)  No partnership data on file   Family History  Problem Relation Age of Onset   Diabetes Father    Scoliosis Daughter    Autoimmune disease Daughter    Sleep apnea Daughter    Anxiety disorder Daughter    Depression Daughter    Personality disorder Daughter    Osteoporosis Maternal Grandmother    Alzheimer's disease Paternal Grandmother    Liver disease Paternal Grandfather    Breast cancer Neg Hx    No Known Allergies  Patient Care Team: Levada Bowersox, PA-C as PCP - General (Physician Assistant) Julia Oats, OD (Optometry)   Medications: Outpatient Medications Prior to Visit  Medication Sig   aspirin EC 81 MG tablet Take 81 mg by mouth daily. Swallow whole.   Blood Glucose Monitoring Suppl (ONETOUCH ULTRALINK) w/Device KIT by Does not apply route 3 (three) times daily.   Continuous Glucose Sensor (DEXCOM G6 SENSOR) MISC Apply topically as directed.   Continuous Glucose Transmitter (DEXCOM G6 TRANSMITTER) MISC    DULoxetine  (CYMBALTA ) 60 MG capsule Take 2 capsules (120 mg total) by mouth daily.   empagliflozin (JARDIANCE) 25 MG TABS tablet Take 1 tablet (25 mg total) by mouth daily.   Insulin  Glargine-aglr (REZVOGLAR  KWIKPEN) 100 UNIT/ML SOPN 26 Units by Other route daily in the afternoon.   insulin  lispro (HUMALOG  KWIKPEN) 200 UNIT/ML KwikPen Max daily 30 units with correction scale   Insulin  Pen Needle 31G X 5 MM MISC 1 Device by Does not apply route in the morning, at noon, in the evening, and at bedtime.   levothyroxine  (SYNTHROID ) 88 MCG tablet Take 1 tablet (88 mcg total) by mouth daily before breakfast.   losartan  (COZAAR ) 25 MG tablet Take 1 tablet (25 mg total) by mouth daily.   metFORMIN  (GLUCOPHAGE ) 1000 MG tablet Take 1 tablet (1,000 mg total) by mouth 2 (two) times daily with a meal.   predniSONE  (DELTASONE ) 10 MG tablet Take by mouth.   rizatriptan  (MAXALT -MLT) 10 MG disintegrating tablet TAKE 1 TABLET BY MOUTH AS NEEDED FOR MIGRAINE. MAY  REPEAT IN 2 HOURS IF NEEDED   rosuvastatin  (CRESTOR ) 20 MG tablet Take 1 tablet (20 mg total) by mouth daily.   tirzepatide  (MOUNJARO ) 10 MG/0.5ML Pen INJECT 10 MG INTO THE SKIN ONE TIME PER WEEK   traZODone  (DESYREL ) 100 MG tablet TAKE 2 TABLETS (200 MG TOTAL) BY MOUTH AT BEDTIME AS NEEDED FOR SLEEP   No facility-administered medications prior to visit.  Review of Systems  All other systems reviewed and are negative.  Except see HPI     Objective    BP 107/69 (BP Location: Right Arm, Patient Position: Sitting)   Pulse 85   Resp 16   Ht 6' (1.829 m)   Wt 222 lb (100.7 kg)   SpO2 100%   BMI 30.11 kg/m      Physical Exam Vitals reviewed.  Constitutional:      General: She is not in acute distress.    Appearance: Normal appearance. She is well-developed. She is not diaphoretic.  HENT:     Head: Normocephalic and atraumatic.  Eyes:     General: No scleral icterus.    Conjunctiva/sclera: Conjunctivae normal.  Neck:     Thyroid: No thyromegaly.  Cardiovascular:     Rate and Rhythm: Normal rate and regular rhythm.     Pulses: Normal pulses.     Heart sounds: Normal heart sounds. No murmur heard. Pulmonary:     Effort: Pulmonary effort is normal. No respiratory distress.     Breath sounds: Normal breath sounds. No wheezing, rhonchi or rales.  Musculoskeletal:     Cervical back: Neck supple.     Right lower leg: No edema.     Left lower leg: No edema.  Lymphadenopathy:     Cervical: No cervical adenopathy.  Skin:    General: Skin is warm and dry.     Findings: No rash.  Neurological:     Mental Status: She is alert and oriented to person, place, and time. Mental status is at baseline.  Psychiatric:        Mood and Affect: Mood normal.        Behavior: Behavior normal.      No results found for any visits on 08/19/23.  Assessment & Plan    Routine Health Maintenance and Physical Exam  Exercise Activities and Dietary recommendations  Goals   None      Immunization History  Administered Date(s) Administered   PFIZER Comirnaty(Gray Top)Covid-19 Tri-Sucrose Vaccine 07/23/2020   Zoster Recombinant(Shingrix ) 08/14/2022    Health Maintenance  Topic Date Due   HIV Screening  Never done   DTaP/Tdap/Td vaccine (1 - Tdap) Never done   Pneumococcal Vaccination (1 of 2 - PCV) Never done   Pap with HPV screening  07/18/2019   COVID-19 Vaccine (2 - Pfizer risk series) 08/13/2020   Mammogram  09/18/2022   Zoster (Shingles) Vaccine (2 of 2) 10/09/2022   Eye exam for diabetics  10/13/2023   Flu Shot  10/15/2023   Hemoglobin A1C  02/06/2024   Yearly kidney function blood test for diabetes  03/30/2024   Yearly kidney health urinalysis for diabetes  08/05/2024   Complete foot exam   08/05/2024   Colon Cancer Screening  09/19/2024   Hepatitis C Screening  Completed   HPV Vaccine  Aged Out   Meningitis B Vaccine  Aged Out    Discussed health benefits of physical activity, and encouraged her to engage in regular exercise appropriate for her age and condition.  Assessment & Plan Transition of care from [facility]  Onychomycosis Fungal infection of the left big toenail. Podiatry intervention preferred over prolonged antifungal medication. - Refer to podiatry for evaluation and possible removal of the affected toenail. - Consider topical antifungal treatment.  Memory Loss Episodes of memory loss with previous imaging showing white lesions and a larger lesion suggestive of a mini-stroke. Neurology evaluation recommended. - Follow up with neurology for  further evaluation of memory loss.  Diabetes Mellitus Chronic Managed by endocrinologist. Reports weight loss, good diet, regular exercise. Recent labs in March and January. Regular follow-up essential. - Continue current diabetes management with endocrinologist. - Schedule follow-up in four months to monitor diabetes management.  Thyroid Disorder Chronic On thyroid  medication/levothyroxine  88mg . Occasional neck discomfort, possible thyroid enlargement. Endocrinology follow-up recommended. - Discuss thyroid management with endocrinologist. - Consider thyroid ultrasound if recommended by endocrinologist. - Monitor TSH levels periodically.  General Health Maintenance Due for vaccinations and mammogram. Prefers OBGYN for mammogram, not interested in vaccines currently. - Follow up with OBGYN for mammogram. - Consider shingles, tetanus, and pneumonia vaccines if she decides to receive them.  Onychomycosis - Ambulatory referral to Podiatry  Tinea pedis, left Possible Advised Aluminium acetate soak, Domeboro , 1 pack to 1 quart warm water  Antifungal cream BID after soaks The patient was advised to call back or seek an in-person evaluation if the symptoms worsen or if the condition fails to improve as anticipated. - Ambulatory referral to Podiatry   Hyperlipidemia associated with type 2 diabetes mellitus (HCC) Chronic LP from 03/2023 WNL, ldl 43 Continue rosuvastatin  20mg  daily, low cholesterol diet and reguar exercise Will repeat LP at the follow-up , per pt preference Will follow-up   Annual physical exam UTD on dental/eye Things to do to keep yourself healthy  - Exercise at least 30-45 minutes a day, 3-4 days a week.  - Eat a low-fat diet with lots of fruits and vegetables, up to 7-9 servings per day.  - Seatbelts can save your life. Wear them always.  - Smoke detectors on every level of your home, check batteries every year.  - Eye Doctor - have an eye exam every 1-2 years  - Safe sex - if you may be exposed to STDs, use a condom.  - Alcohol -  If you drink, do it moderately, less than 2 drinks per day.  - Health Care Power of Attorney. Choose someone to speak for you if you are not able.  - Depression is common in our stressful world.If you're feeling down or losing interest in things you normally enjoy, please come in for a visit.  -  Violence - If anyone is threatening or hurting you, please call immediately.   No follow-ups on file.    The patient was advised to call back or seek an in-person evaluation if the symptoms worsen or if the condition fails to improve as anticipated.  I discussed the assessment and treatment plan with the patient. The patient was provided an opportunity to ask questions and all were answered. The patient agreed with the plan and demonstrated an understanding of the instructions.  I, Moroni Nester, PA-C have reviewed all documentation for this visit. The documentation on 08/19/2023  for the exam, diagnosis, procedures, and orders are all accurate and complete.  Blane Bunting, [facility], MMS [facility] [phone] (phone) [phone] (fax)  [facility]

## 2023-08-22 ENCOUNTER — Other Ambulatory Visit: Payer: Self-pay | Admitting: Internal Medicine

## 2023-08-26 ENCOUNTER — Telehealth: Payer: Self-pay

## 2023-08-26 NOTE — Telephone Encounter (Signed)
 Pharmacy Patient Advocate Encounter   Received notification from CoverMyMeds that prior authorization for Farxiga  10mg  is required/requested.   Insurance verification completed.   The patient is insured through [employer] .   Per test claim: PA required; PA submitted to above mentioned insurance via CoverMyMeds Key/confirmation #/EOC [id number] Status is pending

## 2023-08-31 ENCOUNTER — Other Ambulatory Visit: Payer: Self-pay | Admitting: Physician Assistant

## 2023-09-07 NOTE — Telephone Encounter (Signed)
 Pharmacy Patient Advocate Encounter  Received notification from [employer] that Prior Authorization for Dapagliflozin  Propanediol 10MG  tablets has been DENIED.  Full denial letter will be uploaded to the media tab. See denial reason below.  Based on the information provided, we are unable to approve coverage for this medication because: There is no documentation that the patient was unable to obtain Farxiga  (the brand name product) due to market availability Dapagliflozin  is considered medically necessary when there is documented inability to obtain Farxiga  (the brand name product) due to market availability. Continuation of Sodium Glucose Co-Transporter-2 (SGLT-2) inhibitors and SGLT-2/Metformin  Combination products is considered medically necessary when the above medial necessity criteria are met and there is documentation of beneficial response.  PA #/Case ID/Reference #: [id number]

## 2023-09-14 ENCOUNTER — Ambulatory Visit: Admitting: Podiatry

## 2023-09-14 DIAGNOSIS — B353 Tinea pedis: Secondary | ICD-10-CM

## 2023-09-14 MED ORDER — TERBINAFINE HCL 250 MG PO TABS: 250.0000 mg | ORAL_TABLET | Freq: Every day | ORAL | 0 refills | Status: DC

## 2023-09-14 MED ORDER — CLOTRIMAZOLE-BETAMETHASONE 1-0.05 % EX CREA: 1.0000 | TOPICAL_CREAM | Freq: Every day | CUTANEOUS | 0 refills | Status: DC

## 2023-09-14 NOTE — Progress Notes (Unsigned)
 Subjective:  Patient ID: Valerie Schmitt, female    DOB: [birth year],  MRN: [id number]  Chief Complaint  Patient presents with   Tinea Pedis    58 y.o. female presents with the above complaint.  Patient presents with left moderate athlete's foot noted.  She states been present for quite some time it itches like crazy.  She wanted get it evaluated she has tried over-the-counter options and she has not tried any prescription options or oral medication.  Does not cause her any pain lot of discomfort.   Review of Systems: Negative except as noted in the HPI. Denies N/V/F/Ch.  Past Medical History:  Diagnosis Date   Allergy    Anxiety    Arthritis    Asthma    Depression    Diabetes mellitus without complication (HCC)    Ketoacidosis 03/2016   Sleep apnea    Thyroid disease     Current Outpatient Medications:    clotrimazole-betamethasone (LOTRISONE) cream, Apply 1 Application topically daily., Disp: 30 g, Rfl: 0   terbinafine (LAMISIL) 250 MG tablet, Take 1 tablet (250 mg total) by mouth daily., Disp: 30 tablet, Rfl: 0   aspirin EC 81 MG tablet, Take 81 mg by mouth daily. Swallow whole., Disp: , Rfl:    Blood Glucose Monitoring Suppl (ONETOUCH ULTRALINK) w/Device KIT, by Does not apply route 3 (three) times daily., Disp: , Rfl:    Continuous Glucose Sensor (DEXCOM G6 SENSOR) MISC, Apply topically as directed., Disp: , Rfl:    Continuous Glucose Transmitter (DEXCOM G6 TRANSMITTER) MISC, , Disp: , Rfl:    dapagliflozin  propanediol (FARXIGA ) 10 MG TABS tablet, TAKE 1 TABLET BY MOUTH DAILY BEFORE BREAKFAST., Disp: 90 tablet, Rfl: 1   DULoxetine  (CYMBALTA ) 60 MG capsule, Take 2 capsules (120 mg total) by mouth daily., Disp: 180 capsule, Rfl: 3   empagliflozin (JARDIANCE) 25 MG TABS tablet, Take 1 tablet (25 mg total) by mouth daily., Disp: 90 tablet, Rfl: 3   Insulin  Glargine-aglr (REZVOGLAR  KWIKPEN) 100 UNIT/ML SOPN, 26 Units by Other route daily in the afternoon., Disp: 30 mL,  Rfl: 3   insulin  lispro (HUMALOG  KWIKPEN) 200 UNIT/ML KwikPen, Max daily 30 units with correction scale, Disp: 30 mL, Rfl: 3   Insulin  Pen Needle 31G X 5 MM MISC, 1 Device by Does not apply route in the morning, at noon, in the evening, and at bedtime., Disp: 400 each, Rfl: 3   levothyroxine  (SYNTHROID ) 88 MCG tablet, TAKE 1 TABLET BY MOUTH DAILY BEFORE BREAKFAST., Disp: 30 tablet, Rfl: 1   losartan  (COZAAR ) 25 MG tablet, Take 1 tablet (25 mg total) by mouth daily., Disp: 90 tablet, Rfl: 3   metFORMIN  (GLUCOPHAGE ) 1000 MG tablet, Take 1 tablet (1,000 mg total) by mouth 2 (two) times daily with a meal., Disp: 180 tablet, Rfl: 3   predniSONE  (DELTASONE ) 10 MG tablet, Take by mouth., Disp: , Rfl:    rizatriptan  (MAXALT -MLT) 10 MG disintegrating tablet, TAKE 1 TABLET BY MOUTH AS NEEDED FOR MIGRAINE. MAY REPEAT IN 2 HOURS IF NEEDED, Disp: 10 tablet, Rfl: 3   rosuvastatin  (CRESTOR ) 20 MG tablet, Take 1 tablet (20 mg total) by mouth daily., Disp: 90 tablet, Rfl: 3   tirzepatide  (MOUNJARO ) 10 MG/0.5ML Pen, INJECT 10 MG INTO THE SKIN ONE TIME PER WEEK, Disp: 6 mL, Rfl: 3   traZODone  (DESYREL ) 100 MG tablet, TAKE 2 TABLETS (200 MG TOTAL) BY MOUTH AT BEDTIME AS NEEDED FOR SLEEP, Disp: 180 tablet, Rfl: 0  Social History   Tobacco  Use  Smoking Status Never  Smokeless Tobacco Never    No Known Allergies Objective:  There were no vitals filed for this visit. There is no height or weight on file to calculate BMI. Constitutional Well developed. Well nourished.  Vascular Dorsalis pedis pulses palpable bilaterally. Posterior tibial pulses palpable bilaterally. Capillary refill normal to all digits.  No cyanosis or clubbing noted. Pedal hair growth normal.  Neurologic Normal speech. Oriented to person, place, and time. Epicritic sensation to light touch grossly present bilaterally.  Dermatologic Left athlete's foot noted with subjective component of the skin epidermolysis noted.  Mild pain on palpation.   Subjective complaint of itching noted.  No open wounds or lesion  Orthopedic: Normal joint ROM without pain or crepitus bilaterally. No visible deformities. No bony tenderness.   Radiographs: None Assessment:   1. Athlete's foot, left    Plan:  Patient was evaluated and treated and all questions answered.  Left moderate athlete's foot - All questions and concerns were discussed with the patient in extensive detail given that she does not have any liver issues she would benefit from Lamisil for 30 days.  Encouraged her to finish her course - She will also benefit from Lotrisone cream to be applied twice a day.  She states understanding will do so immediately.  No follow-ups on file.

## 2023-10-02 ENCOUNTER — Other Ambulatory Visit: Payer: Self-pay | Admitting: Physician Assistant

## 2023-10-02 DIAGNOSIS — F339 Major depressive disorder, recurrent, unspecified: Secondary | ICD-10-CM

## 2023-10-11 ENCOUNTER — Other Ambulatory Visit: Payer: Self-pay | Admitting: Podiatry

## 2023-10-26 ENCOUNTER — Ambulatory Visit: Admitting: Podiatry

## 2023-10-30 ENCOUNTER — Other Ambulatory Visit: Payer: Self-pay | Admitting: Physician Assistant

## 2023-10-30 DIAGNOSIS — F5101 Primary insomnia: Secondary | ICD-10-CM

## 2023-11-05 ENCOUNTER — Telehealth: Payer: Self-pay | Admitting: Physician Assistant

## 2023-11-05 DIAGNOSIS — F5101 Primary insomnia: Secondary | ICD-10-CM

## 2023-11-05 NOTE — Telephone Encounter (Signed)
 Patient was just here for a physical in June and doesn't understand why she can't a refill on Trazodone .   She has another appt with you in October 7th.     Please let patient know if she can get enough to last till her appt on 12/21/23.

## 2023-11-08 MED ORDER — TRAZODONE HCL 100 MG PO TABS: 200.0000 mg | ORAL_TABLET | Freq: Every evening | ORAL | 0 refills | Status: DC | PRN

## 2023-11-08 NOTE — Addendum Note (Signed)
 Addended by: Nasim Garofano on: 11/08/2023 05:38 PM   Modules accepted: Orders

## 2023-11-17 ENCOUNTER — Encounter: Payer: Self-pay | Admitting: Physician Assistant

## 2023-12-21 ENCOUNTER — Telehealth: Payer: Self-pay

## 2023-12-21 ENCOUNTER — Encounter: Payer: Self-pay | Admitting: Physician Assistant

## 2023-12-21 ENCOUNTER — Ambulatory Visit: Admitting: Physician Assistant

## 2023-12-21 VITALS — BP 98/67 | HR 95 | Resp 14 | Ht 72.0 in | Wt 216.0 lb

## 2023-12-21 DIAGNOSIS — E785 Hyperlipidemia, unspecified: Secondary | ICD-10-CM

## 2023-12-21 DIAGNOSIS — F339 Major depressive disorder, recurrent, unspecified: Secondary | ICD-10-CM | POA: Diagnosis not present

## 2023-12-21 DIAGNOSIS — E1165 Type 2 diabetes mellitus with hyperglycemia: Secondary | ICD-10-CM

## 2023-12-21 DIAGNOSIS — F5101 Primary insomnia: Secondary | ICD-10-CM | POA: Diagnosis not present

## 2023-12-21 DIAGNOSIS — Z23 Encounter for immunization: Secondary | ICD-10-CM

## 2023-12-21 DIAGNOSIS — E1169 Type 2 diabetes mellitus with other specified complication: Secondary | ICD-10-CM | POA: Diagnosis not present

## 2023-12-21 DIAGNOSIS — E039 Hypothyroidism, unspecified: Secondary | ICD-10-CM | POA: Diagnosis not present

## 2023-12-21 DIAGNOSIS — Z794 Long term (current) use of insulin: Secondary | ICD-10-CM

## 2023-12-21 NOTE — Progress Notes (Addendum)
 Established patient visit  Patient: Valerie Schmitt   DOB: March 25, 1965   58 y.o. Female  MRN: [id number] Visit Date: 12/21/2023  Today's healthcare provider: Jolynn Spencer, PA-C   Chief Complaint  Patient presents with   Medical Management of Chronic Issues    No eye exam scheduled for this year   Subjective     HPI     Medical Management of Chronic Issues    Additional comments: No eye exam scheduled for this year      Last edited by Wilfred Hargis RAMAN, CMA on 12/21/2023  8:09 AM.       Discussed the use of AI scribe software for clinical note transcription with the patient, who gave verbal consent to proceed.  History of Present Illness Valerie Schmitt is a 58 year old female with diabetes and insomnia who presents for follow-up on her diabetes management and medication review.  She manages her diabetes with metformin  twice daily, long-acting insulin  at 24 units in the morning, and short-acting insulin  before meals, adjusting the dose based on blood sugar levels. She uses a Dexcom device for continuous glucose monitoring and takes Mounjaro  weekly. Her endocrinologist has been reducing her long-term insulin  dosage. She is scheduled for a diabetes appointment next month. Her last lipid panel was checked in January.  She takes levothyroxine  88 mcg for thyroid issues, with her last TSH check two years ago. She is willing to have it rechecked today.  She experiences insomnia and takes trazodone  200 mg, experiencing withdrawal symptoms when it runs out. She also takes duloxetine  for depression. She cannot sleep without trazodone  and uses a CPAP machine for sleep apnea.  No current weakness, tingling, or numbness. No issues with depression or anxiety, though she experiences some work-related stress. Her blood pressure is typically low, managed with losartan . She takes a baby aspirin daily, which she believes is for mini-stroke prevention.  She eats three times a day and  drinks plenty of water.       12/21/2023    8:10 AM 08/14/2022    8:59 AM 08/06/2021    9:30 AM  Depression screen PHQ 2/9  Decreased Interest 1 0 1  Down, Depressed, Hopeless 0 0 0  PHQ - 2 Score 1 0 1  Altered sleeping 2 2 2   Tired, decreased energy 2 0 1  Change in appetite 0 0 0  Feeling bad or failure about yourself  0 0 0  Trouble concentrating 0 0 0  Moving slowly or fidgety/restless 0 0 0  Suicidal thoughts 0 0 0  PHQ-9 Score 5 2 4   Difficult doing work/chores Somewhat difficult Not difficult at all Not difficult at all      12/21/2023    8:11 AM  GAD 7 : Generalized Anxiety Score  Nervous, Anxious, on Edge 0  Control/stop worrying 0  Worry too much - different things 0  Trouble relaxing 0  Restless 0  Easily annoyed or irritable 0  Afraid - awful might happen 0  Total GAD 7 Score 0    Medications: Outpatient Medications Prior to Visit  Medication Sig   aspirin EC 81 MG tablet Take 81 mg by mouth daily. Swallow whole.   Blood Glucose Monitoring Suppl (ONETOUCH ULTRALINK) w/Device KIT by Does not apply route 3 (three) times daily.   clotrimazole -betamethasone  (LOTRISONE ) cream Apply 1 Application topically daily.   Continuous Glucose Sensor (DEXCOM G6 SENSOR) MISC Apply topically as directed.   Continuous Glucose Transmitter (DEXCOM G6 TRANSMITTER) MISC  dapagliflozin  propanediol (FARXIGA ) 10 MG TABS tablet TAKE 1 TABLET BY MOUTH DAILY BEFORE BREAKFAST.   DULoxetine  (CYMBALTA ) 60 MG capsule TAKE 2 CAPSULES BY MOUTH DAILY   empagliflozin (JARDIANCE) 25 MG TABS tablet Take 1 tablet (25 mg total) by mouth daily.   Insulin  Glargine-aglr (REZVOGLAR  KWIKPEN) 100 UNIT/ML SOPN 26 Units by Other route daily in the afternoon.   insulin  lispro (HUMALOG  KWIKPEN) 200 UNIT/ML KwikPen Max daily 30 units with correction scale   Insulin  Pen Needle 31G X 5 MM MISC 1 Device by Does not apply route in the morning, at noon, in the evening, and at bedtime.   levothyroxine   (SYNTHROID ) 88 MCG tablet TAKE 1 TABLET BY MOUTH DAILY BEFORE BREAKFAST.   losartan  (COZAAR ) 25 MG tablet Take 1 tablet (25 mg total) by mouth daily.   metFORMIN  (GLUCOPHAGE ) 1000 MG tablet Take 1 tablet (1,000 mg total) by mouth 2 (two) times daily with a meal.   predniSONE  (DELTASONE ) 10 MG tablet Take by mouth.   rizatriptan  (MAXALT -MLT) 10 MG disintegrating tablet TAKE 1 TABLET BY MOUTH AS NEEDED FOR MIGRAINE. MAY REPEAT IN 2 HOURS IF NEEDED   rosuvastatin  (CRESTOR ) 20 MG tablet Take 1 tablet (20 mg total) by mouth daily.   terbinafine  (LAMISIL ) 250 MG tablet Take 1 tablet (250 mg total) by mouth daily.   tirzepatide  (MOUNJARO ) 10 MG/0.5ML Pen INJECT 10 MG INTO THE SKIN ONE TIME PER WEEK   traZODone  (DESYREL ) 100 MG tablet Take 2 tablets (200 mg total) by mouth at bedtime as needed for sleep.   No facility-administered medications prior to visit.    Review of Systems All negative Except see HPI       Objective    BP 98/67   Pulse 95   Resp 14   Ht 6' (1.829 m)   Wt 216 lb (98 kg)   SpO2 99%   BMI 29.29 kg/m     Physical Exam Vitals reviewed.  Constitutional:      General: She is not in acute distress.    Appearance: Normal appearance. She is well-developed. She is not diaphoretic.  HENT:     Head: Normocephalic and atraumatic.  Eyes:     General: No scleral icterus.    Conjunctiva/sclera: Conjunctivae normal.  Neck:     Thyroid: No thyromegaly.  Cardiovascular:     Rate and Rhythm: Normal rate and regular rhythm.     Pulses: Normal pulses.     Heart sounds: Normal heart sounds. No murmur heard. Pulmonary:     Effort: Pulmonary effort is normal. No respiratory distress.     Breath sounds: Normal breath sounds. No wheezing, rhonchi or rales.  Musculoskeletal:     Cervical back: Neck supple.     Right lower leg: No edema.     Left lower leg: No edema.  Lymphadenopathy:     Cervical: No cervical adenopathy.  Skin:    General: Skin is warm and dry.      Findings: No rash.  Neurological:     Mental Status: She is alert and oriented to person, place, and time. Mental status is at baseline.  Psychiatric:        Mood and Affect: Mood normal.        Behavior: Behavior normal.      No results found for any visits on 12/21/23.      Assessment & Plan Type 2 Diabetes Mellitus Chronic  Managed with metformin , insulin , and Mounjaro . Blood sugar monitored via Dexcom. Insulin  doses  adjusted based on readings. Endocrinology follow-up scheduled for further management. - Continue metformin , long-acting insulin , short-acting insulin , and Mounjaro  as prescribed. - Attend endocrinology appointment next month for diabetes management. Continue low carb diet and daily exercise Will fu\  Hypothyroidism Chronic and previously stable Managed with levothyroxine . TSH not checked for two years; testing needed to assess thyroid function and adjust dosage. - Order TSH and free T4 tests. - Continue levothyroxine  88 mcg until test results are available. Will follow-up  hyperlipidemia Chronic Repeat lipid panel needed to assess current status. - Order lipid panel. Continue low cholesterol diet and daily exercise Will follow-up  Insomnia and Depression Chronic and stable Managed with trazodone  and duloxetine . Current trazodone  dose may risk serotonin syndrome. Difficulty sleeping without trazodone . Counseling and psychiatry involvement planned for medication adjustment. - Refer to [facility] for counseling and psychiatric evaluation. - Continue current medications until further evaluation. - Discuss potential medication /trazodone  and Cymbalta  adjustments with a clinical pharmacist.  Polypharmacy Concerns due to multiple medications for various conditions. Plan to review and optimize regimen with a clinical pharmacist. - Consult with clinical pharmacist for medication review and optimization.  Immunization due (Primary)  - Flu  vaccine trivalent PF, 6mos and older(Flulaval,Afluria,Fluarix,Fluzone) - Pneumococcal conjugate vaccine 20-valent  Hypothyroidism, unspecified type  - TSH - T4, free  Hyperlipidemia associated with type 2 diabetes mellitus (HCC)  - Lipid panel  Depression, recurrent  - Amb ref to Integrated Behavioral Health - AMB Referral VBCI Care Management Collaboration of Care: Medication Management AEB  , Primary Care Provider AEB  , Psychiatrist AEB  , and Referral or follow-up with counselor/therapist AEB    Patient/Guardian was advised Release of Information must be obtained prior to any record release in order to collaborate their care with an outside provider. Patient/Guardian was advised if they have not already done so to contact the registration department to sign all necessary forms in order for us  to release information regarding their care.   Consent: Patient/Guardian gives verbal consent for treatment and assignment of benefits for services provided during this visit. Patient/Guardian expressed understanding and agreed to proceed.   Primary insomnia  - Amb ref to Integrated Behavioral Health - AMB Referral VBCI Care Management  Orders Placed This Encounter  Procedures   Flu vaccine trivalent PF, 6mos and older(Flulaval,Afluria,Fluarix,Fluzone)    No follow-ups on file.   The patient was advised to call back or seek an in-person evaluation if the symptoms worsen or if the condition fails to improve as anticipated.  I discussed the assessment and treatment plan with the patient. The patient was provided an opportunity to ask questions and all were answered. The patient agreed with the plan and demonstrated an understanding of the instructions.  I, Marcell Chavarin, PA-C have reviewed all documentation for this visit. The documentation on 12/21/2023  for the exam, diagnosis, procedures, and orders are all accurate and complete.  Jolynn Spencer, [facility], MMS [facility] [phone] (phone) [phone] (fax)  [facility]

## 2023-12-21 NOTE — Progress Notes (Unsigned)
 Care Guide Pharmacy Note  12/21/2023 Name: Coutney Wildermuth MRN: [id number] DOB: [birth year]  Referred By: Ostwalt, Janna, PA-C Reason for referral: Complex Care Management and Call Attempt #1 (Unsuccessful initial outreach to schedule with PHARM D- Allyson)   Latysha Thackston is a 58 y.o. year old female who is a primary care patient of Ostwalt, Janna, PA-C.  Matika Bartell Brasil was referred to the pharmacist for assistance related to: Depression  An unsuccessful telephone outreach was attempted today to contact the patient who was referred to the pharmacy team for assistance with Polypharmacy. Additional attempts will be made to contact the patient.  Leotis Rase [facility], [facility] Guide  Direct Dial : [phone]  Fax [phone]

## 2023-12-22 ENCOUNTER — Ambulatory Visit: Payer: Self-pay | Admitting: Physician Assistant

## 2023-12-22 LAB — TSH: TSH: 1.08 u[IU]/mL (ref 0.450–4.500)

## 2023-12-22 LAB — LIPID PANEL
Chol/HDL Ratio: 2.2 ratio (ref 0.0–4.4)
Cholesterol, Total: 112 mg/dL (ref 100–199)
HDL: 52 mg/dL (ref >39)
LDL Chol Calc (NIH): 38 mg/dL (ref 0–99)
Triglycerides: 124 mg/dL (ref 0–149)
VLDL Cholesterol Cal: 22 mg/dL (ref 5–40)

## 2023-12-22 LAB — T4, FREE: Free T4: 1.36 ng/dL (ref 0.82–1.77)

## 2023-12-22 NOTE — Progress Notes (Signed)
 Care Guide Pharmacy Note  12/22/2023 Name: [name] MRN: [id number] DOB: 1965-04-04  Referred By: Ostwalt, Janna, PA-C Reason for referral: Complex Care Management, Call Attempt #1 (Unsuccessful initial outreach to schedule with PHARM D- Allyson), and Call Attempt #2 (Scheduled initial outreach with PHARM D- Allyson )   Valerie Schmitt is a 58 y.o. year old female who is a primary care patient of Ostwalt, Janna, PA-C.  [name] was referred to the pharmacist for assistance related to: Depression  Successful contact was made with the patient to discuss pharmacy services including being ready for the pharmacist to call at least 5 minutes before the scheduled appointment time and to have medication bottles and any blood pressure readings ready for review. The patient agreed to meet with the pharmacist via In Office on (date/time). 01/12/24 @ 2 PM.  Leotis Rase [facility], [facility] Guide  Direct Dial : [phone]  Fax [phone]

## 2023-12-28 ENCOUNTER — Other Ambulatory Visit: Payer: Self-pay | Admitting: Physician Assistant

## 2023-12-29 ENCOUNTER — Other Ambulatory Visit: Payer: Self-pay | Admitting: Internal Medicine

## 2023-12-29 ENCOUNTER — Other Ambulatory Visit: Payer: Self-pay | Admitting: Physician Assistant

## 2023-12-29 DIAGNOSIS — F5101 Primary insomnia: Secondary | ICD-10-CM

## 2023-12-31 ENCOUNTER — Other Ambulatory Visit: Payer: Self-pay | Admitting: Physician Assistant

## 2023-12-31 DIAGNOSIS — F5101 Primary insomnia: Secondary | ICD-10-CM

## 2024-01-09 ENCOUNTER — Other Ambulatory Visit: Payer: Self-pay | Admitting: Internal Medicine

## 2024-01-10 ENCOUNTER — Ambulatory Visit: Admitting: Licensed Clinical Social Worker

## 2024-01-10 DIAGNOSIS — F339 Major depressive disorder, recurrent, unspecified: Secondary | ICD-10-CM

## 2024-01-10 DIAGNOSIS — F4320 Adjustment disorder, unspecified: Secondary | ICD-10-CM

## 2024-01-10 DIAGNOSIS — F5101 Primary insomnia: Secondary | ICD-10-CM

## 2024-01-10 NOTE — BH Specialist Note (Signed)
 Collaborative Care Initial Assessment    Pt name: Valerie Schmitt MRN# [id number]   Date: 01/10/24   Session Start time 0905 Session End time: 1000 Total time in minutes: 55  Encounter Diagnoses  Name Primary?   Adjustment disorder, unspecified type Yes   Primary insomnia     Type of Contact:  in person  Patient consent obtained:  Yes  Patient and/or legal guardian verbally consented to [facility] services about presenting concerns and psychiatric consultation as appropriate.  The services will be billed as appropriate for the patient   Types of Service: Comprehensive Clinical Assessment (CCA) and Collaborative care  Summary  Valerie Schmitt is a 58 y.o. female with history of insomnia seen in consultation at the request of [name] PA C for establishment of [facility] Collaborative Care management.   Pt is currently taking the following psychiatric medications: cymbalta  120 mg every day and trazodone  200 mg every day  .  Current symptoms include: inability to fall asleep, inability to stay asleep, long naps during the day. Pt denies SI, HI, or AVH at time of session. Pt denies substance use.   Reason for referral in patient/family's own words:  Getting the right medicines to help me sleep  Patient's goal for today's visit: Establish IBH Collaborative Care  History of Present illness:    History of present illness:  Valerie Schmitt reports that they have a history of primary insomnia and mild depression/anxiety for several years and have had the following treatments: cymbalta  120 mg (60 x 2) and trazodone  .  Pt reports concerns about medical history including chronic migraines, diabetes, sleep apnea/CPAP, and hypothyroidism. Pt reports that current external stressors include none at time of session.  Pt feels that symptoms of stress, depression, and anxiety are impacting everyday functioning including sleep quality/quantity, social  interaction, ability to engage in recreational activities, impacting appetite, and ability to engage with tasks both inside and outside of the home. Pt reports that she has a daughter w/ dx of bipolar disorder that is currently stable taking lithium. Valerie Schmitt reports that her friends and family are primary support system at time of assessment.   Pt feels IBH psychiatric medication consultation and IBH collaborative care management check-ins would be something to assist in their overall symptom management. PCP concerned about high dosages of cymbalta /trazodone .   Clinical Assessments (PHQ-9 and GAD-7)  PHQ-9 Assessments:     01/10/2024   10:08 AM 12/21/2023    8:10 AM 08/14/2022    8:59 AM  Depression screen PHQ 2/9  Decreased Interest 0 1 0  Down, Depressed, Hopeless 0 0 0  PHQ - 2 Score 0 1 0  Altered sleeping 2 2 2   Tired, decreased energy 2 2 0  Change in appetite 0 0 0  Feeling bad or failure about yourself  1 0 0  Trouble concentrating 0 0 0  Moving slowly or fidgety/restless 0 0 0  Suicidal thoughts 0 0 0  PHQ-9 Score 5 5 2   Difficult doing work/chores  Somewhat difficult Not difficult at all     GAD-7 Assessments:     01/10/2024   10:08 AM 12/21/2023    8:11 AM  GAD 7 : Generalized Anxiety Score  Nervous, Anxious, on Edge 1 0  Control/stop worrying 1 0  Worry too much - different things 1 0  Trouble relaxing 1 0  Restless 0 0  Easily annoyed or irritable 1 0  Afraid - awful might  happen 0 0  Total GAD 7 Score 5 0  Anxiety Difficulty Not difficult at all       Social History:  Household:  pt resides alone w/ pets Marital status:  divorce Number of Children:  daughter Audria) and son ([place] ) Employment:  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 60 hours per week.  Education:  [employer], [employer]  Psychiatric Review of systems: Insomnia: if I  don't take medication, I can't go to sleep--melatonin doesn't work. Mind racing. Frequent waking,  Pt reports hypersomnia on weekends during the day--14+ hours of sleep. Pt reports napping for long periods of time throughout day if she has opportunity.   Changes in appetite: low appetite--on montjaro w/ diabetes.  Decreased need for sleep: No feel tired and fatigued throughout the day. Naps during the day (2+ hour naps).  Family history of bipolar disorder: No.  Daughter  w/ bipolar disorder chronic fatigue, mental health and hypersomnia. Pt daughter takes vraylar and lithium.  Father with ADHD.    Hallucinations: No   Paranoia: No    Psychotropic medications: cymbalta  60 x 2 every day, trazodone  100 mg x 2 qs   Current medications: Current Outpatient Medications on File Prior to Visit  Medication Sig Dispense Refill   aspirin EC 81 MG tablet Take 81 mg by mouth daily. Swallow whole.     Blood Glucose Monitoring Suppl (ONETOUCH ULTRALINK) w/Device KIT by Does not apply route 3 (three) times daily.     clotrimazole -betamethasone  (LOTRISONE ) cream Apply 1 Application topically daily. 30 g 0   Continuous Glucose Sensor (DEXCOM G6 SENSOR) MISC Apply topically as directed.     Continuous Glucose Transmitter (DEXCOM G6 TRANSMITTER) MISC      dapagliflozin  propanediol (FARXIGA ) 10 MG TABS tablet TAKE 1 TABLET BY MOUTH DAILY BEFORE BREAKFAST. 90 tablet 1   DULoxetine  (CYMBALTA ) 60 MG capsule TAKE 2 CAPSULES BY MOUTH DAILY 180 capsule 0   empagliflozin (JARDIANCE) 25 MG TABS tablet Take 1 tablet (25 mg total) by mouth daily. 90 tablet 3   Insulin  Glargine-aglr (REZVOGLAR  KWIKPEN) 100 UNIT/ML SOPN 24 Units by Other route daily in the afternoon. 90 mL 0   insulin  glargine-yfgn (SEMGLEE ) 100 UNIT/ML Pen Inject 24 Units into the skin daily. 30 mL 0   insulin  lispro (HUMALOG  KWIKPEN) 200 UNIT/ML KwikPen Max daily 30 units with correction scale 30 mL 3   Insulin  Pen Needle 31G X 5 MM MISC 1 Device by Does  not apply route in the morning, at noon, in the evening, and at bedtime. 400 each 3   losartan  (COZAAR ) 25 MG tablet Take 1 tablet (25 mg total) by mouth daily. 90 tablet 3   metFORMIN  (GLUCOPHAGE ) 1000 MG tablet Take 1 tablet (1,000 mg total) by mouth 2 (two) times daily with a meal. 180 tablet 3   predniSONE  (DELTASONE ) 10 MG tablet Take by mouth.     rizatriptan  (MAXALT -MLT) 10 MG disintegrating tablet TAKE 1 TABLET BY MOUTH AS NEEDED FOR MIGRAINE. MAY REPEAT IN 2 HOURS IF NEEDED 10 tablet 3   rosuvastatin  (CRESTOR ) 20 MG tablet Take 1 tablet (20 mg total) by mouth daily. 90 tablet 3   SYNTHROID  88 MCG tablet TAKE 1 TABLET BY MOUTH EVERY DAY BEFORE BREAKFAST 90 tablet 1   terbinafine  (LAMISIL ) 250 MG tablet Take 1 tablet (250 mg total) by mouth daily. 30 tablet 0   tirzepatide  (MOUNJARO ) 10  MG/0.5ML Pen INJECT 10 MG INTO THE SKIN ONE TIME PER WEEK 6 mL 3   traZODone  (DESYREL ) 100 MG tablet TAKE 2 TABLETS (200 MG TOTAL) BY MOUTH AT BEDTIME AS NEEDED FOR SLEEP 90 tablet 0   No current facility-administered medications on file prior to visit.     Patient taking medications as prescribed:  Yes Side effects reported: Yes--pt reports that she has insomnia and gets shaky if she runs out of her trazodone .  Psychiatric History  Have you ever been treated for a mental health problem? Yes If Yes, when were you treated and whom did you see (psychiatrist/counselor) ?     When ongoing for years       Name of provider PCP     Psychiatric History  Depression: Yes Anxiety: Yes Mania: No Psychosis: No PTSD symptoms: No  Past Psychiatric History/Hospitalization(s): Hospitalization for psychiatric illness: No Prior Suicide Attempts: No Prior Self-injurious behavior: No  Have you ever had thoughts of harming yourself or others or attempted suicide? No plan to harm self or others  Traumatic Experiences: History or current traumatic events (natural disaster, house fire, etc.)? no History or  current physical trauma?  no History or current emotional trauma?  no History or current sexual trauma?  no History or current domestic or intimate partner violence?  no   Alcohol and/or Substance Use History   Tobacco Alcohol Other substances  Current use Pt denies (AUDIT-C screening) Pt denies Pt denies  Past use Cigarettes in past Rare--social  THC in high school  Past treatment Pt denies Pt denies Pt denies   Psychologist, Occupational Health from 01/10/2024 in [facility]  AUDIT-C Score 0     Withdrawal Potential: none  [place] Suicide Severity Rating Scale:  Loss Adjuster, Chartered Integrated Behavioral Health from 01/10/2024 in [facility] UC from 07/14/2023 in [facility]  Admission (Discharged) from 09/19/2021 in [facility]  C-SSRS RISK CATEGORY No Risk No Risk No Risk     Guns in the home (secured):  no   The patient demonstrates the following risk factors for suicide: Chronic risk factors for suicide include: N/A. Acute risk factors for suicide include: N/A. Protective factors for this patient include: responsibility to others (children, family), coping skills, and hope for the future. Considering these factors, the overall suicide risk at this point appears to be low. Patient is appropriate for outpatient follow up.  Danger to Others Risk Assessment Danger to others risk factors:  NONE Patient endorses recent thoughts of harming others:  Pt denies Dynamic Appraisal of Situational Aggression (DASA): NONE  BH Counselor discussed emergency crisis plan with client and provided local emergency services resources.  Mental status exam:   General Appearance Siegfried:  Neat Eye Contact:  Good Motor Behavior:  Normal Speech:  Normal Level of Consciousness:  Alert Mood:  NA--WNL Affect:  Appropriate Anxiety Level:  Minimal Thought Process:  Coherent Thought  Content:  WNL Perception:  Normal Judgment:  Good Insight:  Present  Diagnosis: Encounter Diagnoses  Name Primary?   Adjustment disorder, unspecified type Yes   Primary insomnia       Goals: Increase adequate support systems for patient/family   Interventions: Medication Monitoring and Sleep Hygiene   Follow-up Plan: [facility] Collaborative Care management including short term counseling and psychiatric medication consultation   Daysen Gundrum R Britiany Silbernagel, LCSW  Assessment completed by Tawni Brisker, MSW, LCSW  on 01/10/24

## 2024-01-10 NOTE — Patient Instructions (Addendum)
 Using Behavioral Activation to manage stress/depression symptoms    Identify/understand your own mood triggers.   Structure your day--get up around the same time, eat meals/snacks around the same time, go to bed around the same time.   Purposefully schedule self care time and time to complete tasks. This can include quiet time  Stimulate your brain--go for a walk, text/call a friend or family member, if you are indoors--go outside (and vice versa), go for a drive, go to a store with bright colors and bright lights. Try to do things in a different way--drive to your favorite places using an alternative route, or instead of starting on the right side of the grocery store when shopping, start on the left side. You might feel a bit uncomfortable doing things outside of the comfort zone, but this is helping the brain create new neural pathways and is very healthy for brain/emotional health.   Physical movement based on your ability. If you can go for a walk, do stretches, even waving your hands to music can trigger feel-good endorphins in the brain and help release physical tension we all hold in our bodies.  Even 5 minutes can make a difference.   Be intentional about doing things that bring you joy (or used to bring you joy), and look for the things in every day that make you happy.  Seek those glimmers of joy each day.  Set a timer for 5 minutes for a harder task (ex. Laundry, washing dishes).  Allow yourself to work distraction-free for 5 minutes, then stop when the timer goes off. If you need a break, take a break. If you want to continue working then set another timer for whatever time you choose.   Limit or eliminate substance use including alcohol, marijuana, or recreational use of prescription medication.  Let in the light!! Open the window blinds, curtains and let natural light in. Even sitting near a window or sitting outside can boost your mood, especially in the wintertime when there  is less daylight.    Things to envision for ourselves to to improve inspiration, motivation, and initiative :  improving physical wellness, focus on family relationships, focusing on our own mental/emotional well being, being a part of a bigger community, finding a hobby, being a part of something that fosters personal growth, engaging socially with others (even digitally!!!)   Sleep Hygiene Tips  Bedroom Environment - Keep your bedroom cool, quiet, and dark - Use blackout curtains or a sleep mask - Limit noise with earplugs or a white noise machine - Invest in a comfortable mattress and pillows - Remove electronic devices or dim brightness  Sleep Schedule - Go to bed and wake up at the same time every day, even on weekends - Avoid long naps during the day (limit to 2030 minutes if needed) - Establish a relaxing pre-sleep routine (e.g., reading, gentle stretching)  Diet & Substances - Avoid caffeine, nicotine, and alcohol close to bedtime - Don't go to bed hungry or overly full - Limit fluid intake in the evening to reduce nighttime bathroom trips  Technology & Stimulation - Avoid screens (phones, tablets, TVs) at least 30-60 minutes before bed - Use blue light filters if screen use is unavoidable - Don't work or watch intense/triggering content in bed  Mental & Physical Health - Practice relaxation techniques like deep breathing, meditation, or progressive muscle relaxation - Exercise regularly, but not too close to bedtime - Manage stress and anxiety through journaling, therapy, or mindfulness  When You Cant Sleep - Get out of bed if you can't sleep after 20 minutes - Do something calming in dim light, then return to bed when sleepy - Avoid clock-watching, which can increase anxiety    Emergency Resources:  National Suicide & Crisis Lifeline: Call or text 988  Crisis Text Line: Text HOME to 989-167-4988  [facility]  [address], [place],  [place] [address] [phone] or [phone] WALK-IN URGENT CARE 24/7 FOR ANYONE [address] [place], [place]  [phone] Fax: [phone] [email or web address] *Interpreters available *Accepts all insurance and uninsured for Urgent Care needs *Accepts Medicaid and uninsured for outpatient treatment (below)

## 2024-01-11 ENCOUNTER — Encounter: Payer: Self-pay | Admitting: Internal Medicine

## 2024-01-11 ENCOUNTER — Other Ambulatory Visit: Payer: Self-pay

## 2024-01-11 MED ORDER — HUMALOG KWIKPEN 200 UNIT/ML ~~LOC~~ SOPN: PEN_INJECTOR | SUBCUTANEOUS | 3 refills | Status: DC

## 2024-01-11 MED ORDER — INSULIN GLARGINE-YFGN 100 UNIT/ML ~~LOC~~ SOPN: 24.0000 [IU] | PEN_INJECTOR | Freq: Every day | SUBCUTANEOUS | 1 refills | Status: DC

## 2024-01-12 ENCOUNTER — Ambulatory Visit

## 2024-01-12 ENCOUNTER — Telehealth (INDEPENDENT_AMBULATORY_CARE_PROVIDER_SITE_OTHER): Payer: Self-pay | Admitting: Licensed Clinical Social Worker

## 2024-01-12 DIAGNOSIS — F4320 Adjustment disorder, unspecified: Secondary | ICD-10-CM | POA: Diagnosis not present

## 2024-01-12 NOTE — BH Specialist Note (Signed)
 Attestation signed by Warren Becker, PMHNP, DNP 01/12/2024 8:11 AM   Collaborative Care Psychiatric Consultant Case Review   Assessment/Provisional Diagnosis 58 year old female with history of DM2, insomnia, OSA, hyperlipidemia, tinnitus, migraines, and hypothyroidism. The patient is referred for anxiety and depression.   Provisional Diagnosis: # MDD, recurrent, mild   Recommendation 1. Continue Cymbalta  120 mg daily and Trazodone  200 mg at bedtime. 2. Vitamin D levels 3. BH specialist to follow up.   Thank you for your consult. Please contact our collaborative care team for any questions or concerns.   I spent 20 minutes chart reviewing, discussing with [facility] Speicalist and documenting in the chart.   The above treatment considerations and suggestions are based on consultation with the [facility] specialist and/or PCP and a review of information available in the shared registry and the patient's Electronic Health Record (EHR). I have not personally examined the patient. All recommendations should be implemented with consideration of the patient's relevant prior history and current clinical status. Please feel free to call me with any questions about the care of this patient.     Virtual Behavioral Health Treatment Plan Team Note  MRN: [id number] NAME: Valerie Schmitt  DATE: 01/12/24  Start time: Start Time: 0815 End time: Stop Time: 0830 Total time: Total Time in Minutes (Visit): 15  Total number of Virtual BH Treatment Team Plan encounters: 1/4  Treatment Team Attendees: Mahamud Metts, LCSW and Sharlot Becker, DNP   Diagnoses:    ICD-10-CM   1. Adjustment disorder, unspecified type  F43.20       Goals, Interventions and Follow-up Plan Goals: Increase adequate support systems for patient/family Interventions: Medication Monitoring Sleep Hygiene  Medication Management Recommendations: Continue Cymbalta  120 mg daily and Trazodone  200 mg at bedtime.  Follow-up Plan: [facility]  Collaborative Care management including short term counseling and psychiatric medication consultation   History of the present illness Presenting Problem/Current Symptoms: Valerie Schmitt is a 58 y.o. female with history of insomnia seen in consultation at the request of Jolynn Spencer PA C for establishment of [facility] Collaborative Care management.   Pt is currently taking the following psychiatric medications: cymbalta  120 mg every day and trazodone  200 mg every day  .  Current symptoms include: inability to fall asleep, inability to stay asleep, long naps during the day. Pt denies SI, HI, or AVH at time of session. Pt denies substance use.   Psychiatric History   Have you ever been treated for a mental health problem? Yes If Yes, when were you treated and whom did you see (psychiatrist/counselor) ?     When ongoing for years       Name of provider PCP     Psychiatric History  Depression: Yes Anxiety: Yes Mania: No Psychosis: No PTSD symptoms: No   Past Psychiatric History/Hospitalization(s): Hospitalization for psychiatric illness: No Prior Suicide Attempts: No Prior Self-injurious behavior: No   Have you ever had thoughts of harming yourself or others or attempted suicide? No plan to harm self or others  Psychosocial stressors Flowsheet Row Integrated Behavioral Health from 01/10/2024 in [facility]  Current Stressors Other (Comment)  [migraine disorder, hypersomnia]  Familial Stressors None  Sleep Decreased, Difficulty falling asleep, Difficulty staying asleep, Frequent awakening, Sleeping during day  Appetite No problems  Coping ability Normal  Patient taking medications as prescribed Yes, Other (Comment)  [Pt reports that her provider did not want to refill the trazodone  due to the amount that she is taking  and because her PCP was not the primary prescriber]    Self-harm Behaviors Risk Assessment Flowsheet Row Integrated Behavioral Health from  01/10/2024 in [facility]  Self-harm risk factors --  [none]  Have you recently had any thoughts about harming yourself? No    Screenings PHQ-9 Assessments:     01/10/2024   10:08 AM 12/21/2023    8:10 AM 08/14/2022    8:59 AM  Depression screen PHQ 2/9  Decreased Interest 0 1 0  Down, Depressed, Hopeless 0 0 0  PHQ - 2 Score 0 1 0  Altered sleeping 2 2 2   Tired, decreased energy 2 2 0  Change in appetite 0 0 0  Feeling bad or failure about yourself  1 0 0  Trouble concentrating 0 0 0  Moving slowly or fidgety/restless 0 0 0  Suicidal thoughts 0 0 0  PHQ-9 Score 5 5 2   Difficult doing work/chores  Somewhat difficult Not difficult at all   GAD-7 Assessments:     01/10/2024   10:08 AM 12/21/2023    8:11 AM  GAD 7 : Generalized Anxiety Score  Nervous, Anxious, on Edge 1 0  Control/stop worrying 1 0  Worry too much - different things 1 0  Trouble relaxing 1 0  Restless 0 0  Easily annoyed or irritable 1 0  Afraid - awful might happen 0 0  Total GAD 7 Score 5 0  Anxiety Difficulty Not difficult at all     Past Medical History Past Medical History:  Diagnosis Date   Allergy    Anxiety    Arthritis    Asthma    Depression    Diabetes mellitus without complication (HCC)    Ketoacidosis 03/2016   Sleep apnea    Thyroid disease     Vital signs: There were no vitals filed for this visit.  Allergies:  Allergies as of 01/12/2024   (No Known Allergies)    Medication History Current medications:  Outpatient Encounter Medications as of 01/12/2024  Medication Sig   aspirin EC 81 MG tablet Take 81 mg by mouth daily. Swallow whole.   Blood Glucose Monitoring Suppl (ONETOUCH ULTRALINK) w/Device KIT by Does not apply route 3 (three) times daily.   clotrimazole -betamethasone  (LOTRISONE ) cream Apply 1 Application topically daily.   Continuous Glucose Sensor (DEXCOM G6 SENSOR) MISC Apply topically as directed.   Continuous Glucose Transmitter  (DEXCOM G6 TRANSMITTER) MISC    dapagliflozin  propanediol (FARXIGA ) 10 MG TABS tablet TAKE 1 TABLET BY MOUTH DAILY BEFORE BREAKFAST.   DULoxetine  (CYMBALTA ) 60 MG capsule TAKE 2 CAPSULES BY MOUTH DAILY   empagliflozin (JARDIANCE) 25 MG TABS tablet Take 1 tablet (25 mg total) by mouth daily.   Insulin  Glargine-aglr (REZVOGLAR  KWIKPEN) 100 UNIT/ML SOPN 24 Units by Other route daily in the afternoon.   insulin  glargine-yfgn (SEMGLEE ) 100 UNIT/ML Pen Inject 24 Units into the skin daily.   insulin  lispro (HUMALOG  KWIKPEN) 200 UNIT/ML KwikPen Max daily 30 units with correction scale   Insulin  Pen Needle 31G X 5 MM MISC 1 Device by Does not apply route in the morning, at noon, in the evening, and at bedtime.   losartan  (COZAAR ) 25 MG tablet Take 1 tablet (25 mg total) by mouth daily.   metFORMIN  (GLUCOPHAGE ) 1000 MG tablet Take 1 tablet (1,000 mg total) by mouth 2 (two) times daily with a meal.   predniSONE  (DELTASONE ) 10 MG tablet Take by mouth.   rizatriptan  (MAXALT -MLT) 10 MG disintegrating tablet TAKE  1 TABLET BY MOUTH AS NEEDED FOR MIGRAINE. MAY REPEAT IN 2 HOURS IF NEEDED   rosuvastatin  (CRESTOR ) 20 MG tablet Take 1 tablet (20 mg total) by mouth daily.   SYNTHROID  88 MCG tablet TAKE 1 TABLET BY MOUTH EVERY DAY BEFORE BREAKFAST   terbinafine  (LAMISIL ) 250 MG tablet Take 1 tablet (250 mg total) by mouth daily.   tirzepatide  (MOUNJARO ) 10 MG/0.5ML Pen INJECT 10 MG INTO THE SKIN ONE TIME PER WEEK   traZODone  (DESYREL ) 100 MG tablet TAKE 2 TABLETS (200 MG TOTAL) BY MOUTH AT BEDTIME AS NEEDED FOR SLEEP   No facility-administered encounter medications on file as of 01/12/2024.     Scribe for Treatment Team: Lois Slagel R Terese Heier, LCSW

## 2024-01-13 ENCOUNTER — Ambulatory Visit

## 2024-01-13 DIAGNOSIS — Z794 Long term (current) use of insulin: Secondary | ICD-10-CM | POA: Diagnosis not present

## 2024-01-13 DIAGNOSIS — E1165 Type 2 diabetes mellitus with hyperglycemia: Secondary | ICD-10-CM

## 2024-01-13 DIAGNOSIS — F5101 Primary insomnia: Secondary | ICD-10-CM | POA: Diagnosis not present

## 2024-01-13 MED ORDER — TRAZODONE HCL 150 MG PO TABS: 150.0000 mg | ORAL_TABLET | Freq: Every day | ORAL | 0 refills | Status: DC

## 2024-01-13 MED ORDER — HUMALOG KWIKPEN 200 UNIT/ML ~~LOC~~ SOPN: 3.0000 [IU] | PEN_INJECTOR | Freq: Three times a day (TID) | SUBCUTANEOUS | 1 refills | Status: DC

## 2024-01-13 MED ORDER — EMPAGLIFLOZIN 25 MG PO TABS: 25.0000 mg | ORAL_TABLET | Freq: Every day | ORAL | 1 refills | Status: DC

## 2024-01-13 MED ORDER — REZVOGLAR KWIKPEN 100 UNIT/ML ~~LOC~~ SOPN
20.0000 [IU] | PEN_INJECTOR | Freq: Every day | SUBCUTANEOUS | 1 refills | Status: AC
Start: 2024-01-13 — End: ?

## 2024-01-13 NOTE — Addendum Note (Signed)
 Addended by: Frandy Basnett on: 01/13/2024 05:41 PM   Modules accepted: Orders

## 2024-01-13 NOTE — Progress Notes (Addendum)
 01/13/2024 Name: [name] MRN: [id number] DOB: 03/11/1966   Valerie Schmitt is a 58 y.o. year old female who was referred for medication management by their primary care provider, Ostwalt, Janna, PA-C. They presented for a face to face visit today.   They were referred to the pharmacist by their PCP for assistance in managing polypharmacy.   Subjective:  Care Team: Primary Care Provider: Ostwalt, Janna, PA-C   Medication Access/Adherence  Current Pharmacy:  [facility] [phone] [place], [place] - [address] [place] [address] Phone: [phone] Fax: [phone]  [facility] (New Address) - [place], [place] - [address] AT Previously: Viviana Mulligan, [place] [address] [place] [address] Phone: [phone] Fax: [phone]   Patient was originally referred to discuss DDI with trazodone  and duloxetine . During the visit, she reported frequent hypoglycemic events. Additionally, she reported that she has been out of Farxiga  ~one month as insurance is no longer covering this.   Insomnia: Current medications: trazodone  200 mg nightly  Patient has been managed on trazodone  since 04/2019 without issues. She is also on duloxetine  120 mg daily. Patient reports running out of trazodone  therapy ~5 days and experienced withdrawal symptoms. She has since refilled the medication, but has been taking trazodone  100 mg daily. She reports she has been unable to stay asleep at the lower dose.   Diabetes:  Current medications: Mounjaro  10 mg weekly, Humalog  5 units TID with meals, Farxiga  10 mg daily, Rezvoglar  24 units daily Medications tried in the past: Ozempic   Patient reports hypoglycemic s/sx including dizziness, shakiness, sweating.  Macrovascular and Microvascular Risk Reduction:  Statin? yes (high intensity); ACEi/ARB? yes Last urinary albumin/creatinine ratio:   Lab Results  Component Value Date   MICRALBCREAT 166 (H) 08/06/2023   MICRALBCREAT 89 (H) 03/31/2023   MICRALBCREAT 201 (H) 03/17/2022   Last eye exam:  Lab Results  Component Value Date   HMDIABEYEEXA No Retinopathy 10/13/2022   Last foot exam: 08/06/2023 Tobacco Use:  Tobacco Use: Low Risk  (12/21/2023)   Patient History    Smoking Tobacco Use: Never    Smokeless Tobacco Use: Never    Passive Exposure: Not on file    Objective: Dexcom Clarity Report:   Lab Results  Component Value Date   HGBA1C 6.4 (A) 08/06/2023    Lab Results  Component Value Date   CREATININE 0.85 03/31/2023   BUN 17 03/31/2023   NA 141 03/31/2023   K 4.5 03/31/2023   CL 102 03/31/2023   CO2 31 03/31/2023    Lab Results  Component Value Date   CHOL 112 12/21/2023   HDL 52 12/21/2023   LDLCALC 38 12/21/2023   LDLDIRECT 133 (H) 04/20/2019   TRIG 124 12/21/2023   CHOLHDL 2.2 12/21/2023    Medications Reviewed Today   Medications were not reviewed in this encounter       Assessment/Plan:   Diabetes: - Currently controlled with most recent A1c of 6.4%; goal A1c <7%.Concern for hypoglycemia on CGM report both overnight and following mealtime insulin  administration. Overnight hypoglycemia 6/14 days this past 2 weeks. Will decrease both bolus and basal insulin  doses. Patient is currently managed by endocrinology, but could possibly consider dose titration of GLP/GIP agent and further decrease and/or discontinuation of some insulin  products in the future. It appears that preferred SGLT2i is Jardiance - she has been out of SGLT2i therapy for about a  month at this time. Additional concern for possible hypoglycemia as she restarts SGLT2i agent. -Decreased dose of basal insulin  Revzoglar from 24 to 20  units daily.  -Decreased dose of rapid insulin  Humalog  (insulin  lispro) from 5 units TID to 3 units TID with meals.  -Continued GLP-1 Mounjaro  (tirzepatide )  10 mg daily .  -Switched SGLT2-I  Farxiga  (dapagliflozin )to Jardiance (empagliflozin) 25 mg daily.  -Continued metformin  1000 mg BID.  -Patient educated on purpose, proper use, and potential adverse effects of Jardiance.  -Extensively discussed pathophysiology of diabetes, recommended lifestyle interventions, dietary effects on blood sugar control.  -Counseled on s/sx of and management of hypoglycemia  Insomnia: -Currently uncontrolled (unable to stay asleep) on trazodone  100 mg nightly, Reports no difficulty sleeping at a dose of 200 mg nightly.  -Concern for increased risk of serotonin syndrome with various serotonergic therapies, however, patient has been stable on both since 2021.  -Could consider dose reduction to 150 mg nightly. If patient remains stable on that dose, could trial further dose reduction to 100 mg nightly.  -Counseled on s/sx of serotonin syndrome  Follow Up Plan:   PCP on 04/14/23 Endocrinology on 02/08/24  Peyton CHARLENA Ferries, PharmD, CPP Clinical Pharmacist [facility] [phone]

## 2024-01-13 NOTE — Addendum Note (Signed)
 Addended by: MARSH, Feleica Fulmore E on: 01/13/2024 01:06 PM   Modules accepted: Orders

## 2024-01-19 ENCOUNTER — Telehealth (INDEPENDENT_AMBULATORY_CARE_PROVIDER_SITE_OTHER): Payer: Self-pay | Admitting: Licensed Clinical Social Worker

## 2024-01-19 DIAGNOSIS — F4320 Adjustment disorder, unspecified: Secondary | ICD-10-CM

## 2024-01-19 NOTE — BH Specialist Note (Cosign Needed Addendum)
 Virtual Behavioral Health Treatment Plan Team Note  MRN: [id number] NAME: Valerie Schmitt  DATE: 01/19/24  Start time: Start Time: 0830 End time: Stop Time: 0845 Total time: Total Time in Minutes (Visit): 15  Total number of Virtual BH Treatment Team Plan encounters: 2/4  Treatment Team Attendees: Romesha Scherer, LCSW and Sharlot Becker, DNP   Diagnoses:    ICD-10-CM   1. Adjustment disorder, unspecified type  F43.20       Goals, Interventions and Follow-up Plan Goals: Increase adequate support systems for patient/family Interventions: Medication Monitoring Sleep Hygiene  Medication Management Recommendations: Psychiatric referral indicated for any psychiatric medication changes (mood, insomnia). PCP and pharmacist to continue monitoring medications that are managed by PCP for this patient.   Follow-up Plan: [facility] Collaborative Care management including short term counseling and psychiatric medication consultation Psychiatric referral as needed in future.  IBH clinician to continue monitoring progress.   History of the present illness Presenting Problem/Current Symptoms: Pts primary care provider would prefer a change to cymbalta  or trazodone  dosages--or change medication.  Pt had consultation w/  pharmacist on 10/30 that provided medication recommendations for this patient.   Psychiatric History   Have you ever been treated for a mental health problem? Yes If Yes, when were you treated and whom did you see (psychiatrist/counselor) ?     When ongoing for years       Name of provider PCP     Psychiatric History  Depression: Yes Anxiety: Yes Mania: No Psychosis: No PTSD symptoms: No   Past Psychiatric History/Hospitalization(s): Hospitalization for psychiatric illness: No Prior Suicide Attempts: No Prior Self-injurious behavior: No   Have you ever had thoughts of harming yourself or others or attempted suicide? No plan to harm self or others  Psychosocial  stressors Flowsheet Row Integrated Behavioral Health from 01/10/2024 in [facility]  Current Stressors Other (Comment)  [migraine disorder, hypersomnia]  Familial Stressors None  Sleep Decreased, Difficulty falling asleep, Difficulty staying asleep, Frequent awakening, Sleeping during day  Appetite No problems  Coping ability Normal  Patient taking medications as prescribed Yes, Other (Comment)  [Pt reports that her provider did not want to refill the trazodone  due to the amount that she is taking and because her PCP was not the primary prescriber]    Self-harm Behaviors Risk Assessment Flowsheet Row Integrated Behavioral Health from 01/10/2024 in [facility]  Self-harm risk factors --  [none]  Have you recently had any thoughts about harming yourself? No    Screenings PHQ-9 Assessments:     01/10/2024   10:08 AM 12/21/2023    8:10 AM 08/14/2022    8:59 AM  Depression screen PHQ 2/9  Decreased Interest 0 1 0  Down, Depressed, Hopeless 0 0 0  PHQ - 2 Score 0 1 0  Altered sleeping 2 2 2   Tired, decreased energy 2 2 0  Change in appetite 0 0 0  Feeling bad or failure about yourself  1 0 0  Trouble concentrating 0 0 0  Moving slowly or fidgety/restless 0 0 0  Suicidal thoughts 0 0 0  PHQ-9 Score 5 5 2   Difficult doing work/chores  Somewhat difficult Not difficult at all   GAD-7 Assessments:     01/10/2024   10:08 AM 12/21/2023    8:11 AM  GAD 7 : Generalized Anxiety Score  Nervous, Anxious, on Edge 1 0  Control/stop worrying 1 0  Worry too much - different things 1 0  Trouble relaxing 1 0  Restless 0 0  Easily annoyed or irritable 1 0  Afraid - awful might happen 0 0  Total GAD 7 Score 5 0  Anxiety Difficulty Not difficult at all     Past Medical History Past Medical History:  Diagnosis Date   Allergy    Anxiety    Arthritis    Asthma    Depression    Diabetes mellitus without complication (HCC)     Ketoacidosis 03/2016   Sleep apnea    Thyroid disease     Vital signs: There were no vitals filed for this visit.  Allergies:  Allergies as of 01/19/2024   (No Known Allergies)    Medication History Current medications:  Outpatient Encounter Medications as of 01/19/2024  Medication Sig   aspirin EC 81 MG tablet Take 81 mg by mouth daily. Swallow whole.   Blood Glucose Monitoring Suppl (ONETOUCH ULTRALINK) w/Device KIT by Does not apply route 3 (three) times daily.   Continuous Glucose Sensor (DEXCOM G6 SENSOR) MISC Apply topically as directed.   Continuous Glucose Transmitter (DEXCOM G6 TRANSMITTER) MISC    DULoxetine  (CYMBALTA ) 60 MG capsule TAKE 2 CAPSULES BY MOUTH DAILY   empagliflozin (JARDIANCE) 25 MG TABS tablet Take 1 tablet (25 mg total) by mouth daily.   Insulin  Glargine-aglr (REZVOGLAR  KWIKPEN) 100 UNIT/ML SOPN Inject 20 Units into the skin daily in the afternoon.   insulin  lispro (HUMALOG  KWIKPEN) 200 UNIT/ML KwikPen Inject 3 Units into the skin 3 (three) times daily before meals. Max daily 30 units with correction scale   Insulin  Pen Needle 31G X 5 MM MISC 1 Device by Does not apply route in the morning, at noon, in the evening, and at bedtime.   losartan  (COZAAR ) 25 MG tablet Take 1 tablet (25 mg total) by mouth daily.   metFORMIN  (GLUCOPHAGE ) 1000 MG tablet Take 1 tablet (1,000 mg total) by mouth 2 (two) times daily with a meal.   rizatriptan  (MAXALT -MLT) 10 MG disintegrating tablet TAKE 1 TABLET BY MOUTH AS NEEDED FOR MIGRAINE. MAY REPEAT IN 2 HOURS IF NEEDED   rosuvastatin  (CRESTOR ) 20 MG tablet Take 1 tablet (20 mg total) by mouth daily.   SYNTHROID  88 MCG tablet TAKE 1 TABLET BY MOUTH EVERY DAY BEFORE BREAKFAST   tirzepatide  (MOUNJARO ) 10 MG/0.5ML Pen INJECT 10 MG INTO THE SKIN ONE TIME PER WEEK   traZODone  (DESYREL ) 150 MG tablet Take 1 tablet (150 mg total) by mouth at bedtime.   No facility-administered encounter medications on file as of 01/19/2024.     Scribe  for Treatment Team: Lamiah Marmol R Emillio Ngo, LCSW

## 2024-01-20 ENCOUNTER — Other Ambulatory Visit: Payer: Self-pay | Admitting: Physician Assistant

## 2024-01-20 ENCOUNTER — Other Ambulatory Visit (HOSPITAL_COMMUNITY): Payer: Self-pay

## 2024-01-20 DIAGNOSIS — F339 Major depressive disorder, recurrent, unspecified: Secondary | ICD-10-CM

## 2024-01-20 NOTE — Telephone Encounter (Signed)
 Last read by Corean Helling Cullins at 11:43AM on 01/20/2024.

## 2024-01-21 ENCOUNTER — Ambulatory Visit: Admitting: Licensed Clinical Social Worker

## 2024-01-21 DIAGNOSIS — F4320 Adjustment disorder, unspecified: Secondary | ICD-10-CM | POA: Diagnosis not present

## 2024-01-21 NOTE — Patient Instructions (Signed)
 Sleep Hygiene Tips  Bedroom Environment - Keep your bedroom cool, quiet, and dark - Use blackout curtains or a sleep mask - Limit noise with earplugs or a white noise machine - Invest in a comfortable mattress and pillows - Remove electronic devices or dim brightness  Sleep Schedule - Go to bed and wake up at the same time every day, even on weekends - Avoid long naps during the day (limit to 2030 minutes if needed) - Establish a relaxing pre-sleep routine (e.g., reading, gentle stretching)  Diet & Substances - Avoid caffeine, nicotine, and alcohol close to bedtime - Don't go to bed hungry or overly full - Limit fluid intake in the evening to reduce nighttime bathroom trips  Technology & Stimulation - Avoid screens (phones, tablets, TVs) at least 30-60 minutes before bed - Use blue light filters if screen use is unavoidable - Don't work or watch intense/triggering content in bed  Mental & Physical Health - Practice relaxation techniques like deep breathing, meditation, or progressive muscle relaxation - Exercise regularly, but not too close to bedtime - Manage stress and anxiety through journaling, therapy, or mindfulness  When You Cant Sleep - Get out of bed if you can't sleep after 20 minutes - Do something calming in dim light, then return to bed when sleepy - Avoid clock-watching, which can increase anxiety   Emergency Resources:  National Suicide & Crisis Lifeline: Call or text 988  Crisis Text Line: Text HOME to 902-025-8006  [facility]  [address], [place], [place] [address] [phone] or [phone] WALK-IN URGENT CARE 24/7 FOR ANYONE [address] [place], [place]  [phone] Fax: [phone] [email or web address] *Interpreters available *Accepts all insurance and uninsured for Urgent Care needs *Accepts Medicaid and uninsured for outpatient treatment (below)

## 2024-01-21 NOTE — BH Specialist Note (Signed)
 Virtual Visit via Video Note  I connected with Valerie Schmitt on 01/21/24 at 11:30 AM EST by a video enabled telemedicine application and verified that I am speaking with the correct person using two identifiers.  Location: Patient: Patient primary residence, [place] Provider: Clinician Virtual office, [place]   I discussed the limitations of evaluation and management by telemedicine and the availability of in person appointments. The patient expressed understanding and agreed to proceed.   I discussed the assessment and treatment plan with the patient. The patient was provided an opportunity to ask questions and all were answered. The patient agreed with the plan and demonstrated an understanding of the instructions.   Integrated Behavioral Health Follow Up video Visit  MRN: [id number] Name: Valerie Schmitt  Number of Integrated Behavioral Health Clinician visits: 4- Fourth Visit  Session Start time: 1130   Session End time: 1200  Total time in minutes: 30  Encounter Diagnosis  Name Primary?   Adjustment disorder, unspecified type Yes    Types of Service: Video visit and Collaborative care   Subjective: Presenting Problem/Current Symptoms: Valerie Schmitt is a 58 y.o. female with history of insomnia seen in consultation at the request of Jolynn Spencer PA C for establishment of [facility] Collaborative Care management.   Pt is currently taking the following psychiatric medications: cymbalta  120 mg every day and trazodone  200 mg every day  .  Current symptoms include: inability to fall asleep, inability to stay asleep, long naps during the day. Pt denies SI, HI, or AVH at time of session. Pt denies substance use.     Objective: Mood: Improving and Affect: Appropriate Risk of harm to self or others: No plan to harm self or others  Life Context: Family and Social: Patient reports that she is spending quality time with her friends, recently attended a [employer] concert, and  enjoys going to [place] and festivals.  School/Work: Patient reports that she has less stress at work currently because they are not as busy this time of year.  Patient is using this to her advantage by trying to improve overall worklife balance.  Patient reports that she has to go on a work trip to [facility] in December.  Self-Care: Patient reports that she has lost over 100 pounds, and is working hard at maintaining her overall health.  Patient reports that she is being intentional about taking breaks throughout the day while working, and is looking forward to spending quality time with family over the holidays.  Medication review: Patient reports that she is currently working with the pharmacist at [facility] family practice.  They have decided to decrease her overall trazodone  to 150 mg, and currently patient reports that this is working well to regulate her insomnia  Mood/Anxiety/Sleep quality:sleep good currently.   Self reflection of progress: patient reports that she feels that she is progressing well.  Patient reports that she feels her overall mood is stable, her stress levels have improved, and her sleep quality is good.  Patient and/or Family's Strengths/Protective Factors: Social and Emotional competence, Concrete supports in place (healthy food, safe environments, etc.), Sense of purpose, and Physical Health (exercise, healthy diet, medication compliance, etc.)  Goals Addressed: Patient will:  Reduce symptoms of: stress and insomnia   Increase knowledge and/or ability of: healthy habits and self-management skills   Demonstrate ability to: Increase healthy adjustment to current life circumstances  Progress towards Goals: Ongoing--patient is progressing well  Interventions: Interventions utilized:  Medication Monitoring and Sleep Hygiene  Standardized Assessments completed: Not Needed   Patient and/or Family Response: Patient reports that she feels that she is progressing  well  Patient Centered Plan: Patient is on the following Treatment Plan(s):  PCP and pharmacist manage medications, with psychiatrist referral if needed [facility] specialist to follow-up  Clinical Assessment/Diagnosis  Encounter Diagnosis  Name Primary?   Adjustment disorder, unspecified type Yes    Assessment: Patient currently experiencing limited symptoms of depression, limited symptoms of anxiety.  Patient reports that she is experiencing less external stressors, and is sleeping well.   Patient may benefit from continuing IBH support to assess ongoing progress maintenance.  Plan: Follow up with behavioral health clinician on : 02/25/24 Behavioral recommendations:  Continue medications as prescribed Continue behavioral activation recommendations Referral(s): Integrated [employer] (In Clinic)  [place] R Crystallynn Noorani, LCSW

## 2024-02-01 ENCOUNTER — Other Ambulatory Visit: Payer: Self-pay | Admitting: Internal Medicine

## 2024-02-08 ENCOUNTER — Encounter: Payer: Self-pay | Admitting: Internal Medicine

## 2024-02-08 ENCOUNTER — Ambulatory Visit (INDEPENDENT_AMBULATORY_CARE_PROVIDER_SITE_OTHER): Admitting: Internal Medicine

## 2024-02-08 ENCOUNTER — Ambulatory Visit: Admitting: Internal Medicine

## 2024-02-08 VITALS — BP 104/62 | Ht 72.0 in | Wt 213.0 lb

## 2024-02-08 DIAGNOSIS — Z794 Long term (current) use of insulin: Secondary | ICD-10-CM

## 2024-02-08 DIAGNOSIS — E782 Mixed hyperlipidemia: Secondary | ICD-10-CM

## 2024-02-08 DIAGNOSIS — E1129 Type 2 diabetes mellitus with other diabetic kidney complication: Secondary | ICD-10-CM

## 2024-02-08 DIAGNOSIS — E1165 Type 2 diabetes mellitus with hyperglycemia: Secondary | ICD-10-CM

## 2024-02-08 DIAGNOSIS — R809 Proteinuria, unspecified: Secondary | ICD-10-CM

## 2024-02-08 LAB — POCT GLYCOSYLATED HEMOGLOBIN (HGB A1C): Hemoglobin A1C: 5.6 % (ref 4.0–5.6)

## 2024-02-08 MED ORDER — INSULIN PEN NEEDLE 31G X 5 MM MISC: 1.0000 | Freq: Four times a day (QID) | 3 refills | Status: AC

## 2024-02-08 MED ORDER — BASAGLAR KWIKPEN 100 UNIT/ML ~~LOC~~ SOPN: 10.0000 [IU] | PEN_INJECTOR | Freq: Every day | SUBCUTANEOUS | 3 refills | Status: AC

## 2024-02-08 MED ORDER — METFORMIN HCL 1000 MG PO TABS: 1000.0000 mg | ORAL_TABLET | Freq: Two times a day (BID) | ORAL | 3 refills | Status: AC

## 2024-02-08 MED ORDER — HUMALOG KWIKPEN 200 UNIT/ML ~~LOC~~ SOPN: PEN_INJECTOR | SUBCUTANEOUS | 2 refills | Status: AC

## 2024-02-08 MED ORDER — TIRZEPATIDE 12.5 MG/0.5ML ~~LOC~~ SOAJ: 12.5000 mg | SUBCUTANEOUS | 3 refills | Status: AC

## 2024-02-08 MED ORDER — EMPAGLIFLOZIN 25 MG PO TABS: 25.0000 mg | ORAL_TABLET | Freq: Every day | ORAL | 1 refills | Status: AC

## 2024-02-08 NOTE — Patient Instructions (Addendum)
-   Increase Mounjaro  12.5 mg weekly  - Continue Metformin  1000 mg twice daily  - Continue Jardiance  25 mg daily  - Decrease insulin  glargine   10 units daily  - Stop Humalog  3 units with    - Humalog  correctional insulin : ADD extra units on insulin  to your meal-time Humalog  dose if your blood sugars are higher than 150. Use the scale below to help guide you:   Blood sugar before meal Number of units to inject  Less than 155 0 unit  156 - 180 1 units  181 - 205 2 units  206 - 230 3 units  231- 255 4 units  256 - 280 5 units  281 - 305 6 units  306 - 330 7 units        HOW TO TREAT LOW BLOOD SUGARS (Blood sugar LESS THAN 70 MG/DL) Please follow the RULE OF 15 for the treatment of hypoglycemia treatment (when your (blood sugars are less than 70 mg/dL)   STEP 1: Take 15 grams of carbohydrates when your blood sugar is low, which includes:  3-4 GLUCOSE TABS  OR 3-4 OZ OF JUICE OR REGULAR SODA OR ONE TUBE OF GLUCOSE GEL    STEP 2: RECHECK blood sugar in 15 MINUTES STEP 3: If your blood sugar is still low at the 15 minute recheck --> then, go back to STEP 1 and treat AGAIN with another 15 grams of carbohydrates.

## 2024-02-08 NOTE — Progress Notes (Signed)
 Name: Valerie Schmitt  Age/ Sex: 58 y.o., female   MRN/ DOB: [id number], [birth year]     PCP: Ostwalt, Janna, PA-C   Reason for Endocrinology Evaluation: Type 2 Diabetes Mellitus  Initial Endocrine Consultative Visit: 04/26/2019    PATIENT IDENTIFIER: Valerie Schmitt is a 58 y.o. female with a past medical history of T2DM and Dyslipidemia. The patient has followed with Endocrinology clinic since 04/26/2019 for consultative assistance with management of her diabetes.  DIABETIC HISTORY:  Valerie Schmitt was diagnosed with T2DM in 2016. She has been on Glimepiride in the past without reported intolerance.She moved from [place]  06/2018 and records were not available.  Her hemoglobin A1c was 12.9% on initial visit.  On her initial visit to our clinic, her A1c was 12.9%. She was already on MDI regimen , metformin  and Ozempic  which were continued.    Switched Ozempic  to mounjaro  03/2023  SUBJECTIVE:   During the last visit (08/06/2023): A1c 6.4%     Today (02/08/2024): [name] is here for a follow up on diabetes management.  She checks her blood sugars multiple daily through CGM . The patient has had hypoglycemic episodes since the last clinic visit, she is not always symptomatic.   She met with the pharmacist at her PCP's office which decreased her basal and prandial insulin    No nausea  No constipation or diarrhea    HOME DIABETES REGIMEN:  Metformin  1000 mg BID Jardiance  25 mg daily  Mounjaro  10 mg weekly  Basaglar  20 units daily Humalog   3 units with meals  Correction scale : Humalog  (BG-130/20)  Rosuvastatin  20 mg daily  Losartan  25 mg daily    Statin: yes ACE-I/ARB: no    CONTINUOUS GLUCOSE MONITORING RECORD INTERPRETATION    Dates of Recording: 11/12-11/25/2025  Sensor description:dexcom  Results statistics:   CGM use % of time 93  Average and SD 116/30  Time in range 95 %  % Time Above 180 5  % Time above 250 0  % Time Below target <1       Glycemic patterns summary: BGs are optimal throughout the day and night Hyperglycemic episodes where postprandial  Hypoglycemic episodes occurred at variable times  Overnight periods: Mostly optimal       DIABETIC COMPLICATIONS: Microvascular complications:   Denies: CKD, neuropathy, retinopathy  Last Eye Exam: Completed 12/2022  Macrovascular complications:  Denies: CAD, CVA, PVD   HISTORY:  Past Medical History:  Past Medical History:  Diagnosis Date   Allergy    Anxiety    Arthritis    Asthma    Depression    Diabetes mellitus without complication (HCC)    Ketoacidosis 03/2016   Sleep apnea    Thyroid disease    Past Surgical History:  Past Surgical History:  Procedure Laterality Date   BREAST SURGERY     COLONOSCOPY WITH PROPOFOL  N/A 09/19/2021   Procedure: COLONOSCOPY WITH PROPOFOL ;  Surgeon: Therisa Bi, MD;  Location: [facility] ENDOSCOPY;  Service: Gastroenterology;  Laterality: N/A;  DM   HERNIA REPAIR     REDUCTION MAMMAPLASTY Bilateral 2003   TUBAL LIGATION     Social History:  reports that she has never smoked. She has never used smokeless tobacco. She reports that she does not currently use alcohol. She reports that she does not use drugs. Family History:  Family History  Problem Relation Age of Onset   Diabetes Father    Scoliosis Daughter    Autoimmune disease Daughter  Sleep apnea Daughter    Anxiety disorder Daughter    Depression Daughter    Personality disorder Daughter    Osteoporosis Maternal Grandmother    Alzheimer's disease Paternal Grandmother    Liver disease Paternal Grandfather    Breast cancer Neg Hx      HOME MEDICATIONS: Allergies as of 02/08/2024   No Known Allergies      Medication List        Accurate as of February 08, 2024  8:19 AM. If you have any questions, ask your nurse or doctor.          aspirin EC 81 MG tablet Take 81 mg by mouth daily. Swallow whole.   Dexcom G6 Sensor Misc Apply  topically as directed.   Dexcom G6 Transmitter Misc USE AS DIRECTED   DULoxetine  60 MG capsule Commonly known as: CYMBALTA  TAKE 2 CAPSULES BY MOUTH EVERY DAY   empagliflozin  25 MG Tabs tablet Commonly known as: Jardiance  Take 1 tablet (25 mg total) by mouth daily.   HumaLOG  KwikPen 200 UNIT/ML KwikPen Generic drug: insulin  lispro Inject 3 Units into the skin 3 (three) times daily before meals. Max daily 30 units with correction scale   Insulin  Pen Needle 31G X 5 MM Misc 1 Device by Does not apply route in the morning, at noon, in the evening, and at bedtime.   losartan  25 MG tablet Commonly known as: COZAAR  Take 1 tablet (25 mg total) by mouth daily.   metFORMIN  1000 MG tablet Commonly known as: GLUCOPHAGE  Take 1 tablet (1,000 mg total) by mouth 2 (two) times daily with a meal.   Mounjaro  10 MG/0.5ML Pen Generic drug: tirzepatide  INJECT 10 MG INTO THE SKIN ONE TIME PER WEEK   OneTouch UltraLink w/Device Kit by Does not apply route 3 (three) times daily.   Rezvoglar  KwikPen 100 UNIT/ML Sopn Generic drug: Insulin  Glargine-aglr Inject 20 Units into the skin daily in the afternoon.   rizatriptan  10 MG disintegrating tablet Commonly known as: MAXALT -MLT TAKE 1 TABLET BY MOUTH AS NEEDED FOR MIGRAINE. MAY REPEAT IN 2 HOURS IF NEEDED   rosuvastatin  20 MG tablet Commonly known as: Crestor  Take 1 tablet (20 mg total) by mouth daily.   Synthroid  88 MCG tablet Generic drug: levothyroxine  TAKE 1 TABLET BY MOUTH EVERY DAY BEFORE BREAKFAST   traZODone  150 MG tablet Commonly known as: DESYREL  Take 1 tablet (150 mg total) by mouth at bedtime.         OBJECTIVE:   Vital Signs: BP 104/62   Ht 6' (1.829 m)   Wt 213 lb (96.6 kg)   BMI 28.89 kg/m   Wt Readings from Last 3 Encounters:  02/08/24 213 lb (96.6 kg)  12/21/23 216 lb (98 kg)  08/19/23 222 lb (100.7 kg)     Exam: General: Pt appears well and is in NAD  Lungs: Clear with good BS bilat   Heart: RRR    Abdomen: soft, nontender  Extremities: No pretibial edema.   Neuro: MS is good with appropriate affect, pt is alert and Ox3      DM foot exam: 08/06/2023   The skin of the feet is intact  The pedal pulses are 2+ on right and 2+ on left. The sensation is intact to a screening 5.07, 10 gram monofilament bilaterally    DATA REVIEWED:  Lab Results  Component Value Date   HGBA1C 5.6 02/08/2024   HGBA1C 6.4 (A) 08/06/2023   HGBA1C 6.4 (A) 03/31/2023     Latest  Reference Range & Units 08/06/23 08:33  Microalb, Ur mg/dL 86.1  MICROALB/CREAT RATIO <30 mg/g creat 166 (H)  Creatinine, Urine 20 - 275 mg/dL 83   Old records , labs and images have been reviewed.    ASSESSMENT / PLAN / RECOMMENDATIONS:   1) Type 2 Diabetes Mellitus, Optimally  controlled, With Microalbuminuria complications - Most recent A1c of 5.6 %. Goal A1c < 7.0 %.    - A1c remains optimal -The patient had her insulin  regimen recently decreased through her PCPs pharmacist, but the patient continues with hypoglycemia, and with an A1c of 5.6%, we have opted to discontinue standing dose of prandial insulin , she may continue to use Humalog  per correction scale before each meal if needed, I will also decrease her basal insulin  by 50% -Will increase Mounjaro  as below - Patient encouraged to start a multivitamin and weight strengthening exercise  MEDICATIONS: - Continue Metformin  1000 mg TWice a day  - Continue Jardiance  25 mg daily  -Increase Mounjaro  12 mg weekly - Decrease Basaglar   10 units ONCE a day  - Stop Humalog  3 units with meals - Continue Correction scale : Humalog  (BG-130/20)   EDUCATION / INSTRUCTIONS: BG monitoring instructions: Patient is instructed to check her blood sugars 3 times a day, before each meal . Call [place] [facility] clinic if: BG persistently < 70  I reviewed the Rule of 15 for the treatment of hypoglycemia in detail with the patient. Literature supplied.     2) Diabetic  complications:  Eye: Does not have known diabetic retinopathy.  Neuro/ Feet: Does not have known diabetic peripheral neuropathy .  Renal: Patient does not have known baseline CKD. She   is not on an ACEI/ARB at present.   3) Dyslipidemia :   -I have switched atorvastatin  80 mg to rosuvastatin  in the past  - Her most recent LDL has been at goal from labs dated 12/21/2023  Medication   Continue rosuvastatin  20 mg daily  4) Microalbuminuria:  - I had started her on losartan  in May, 2025 with an elevated and worsening MA/CR ratio - Patient assures me compliance   Medication Continue losartan  25 mg daily     F/U in 6 months    Signed electronically by: Stefano Redgie Butts, MD  [facility] Endocrinology  [facility] [address] [place] [place], [place] [address] Phone: [phone] FAX: [phone]   CC: Dineen Jolynn RIGGERS [address] [place] [address] Phone: [phone]  Fax: [phone]  Return to Endocrinology clinic as below: Future Appointments  Date Time Provider Department Center  02/25/2024 10:30 AM Hussami, Tawni [place], [place] [facility] [place]  04/13/2024  8:20 AM Dineen Jolynn, PA-C [facility] [place]

## 2024-02-25 ENCOUNTER — Ambulatory Visit: Admitting: Licensed Clinical Social Worker

## 2024-02-25 DIAGNOSIS — Z91199 Patient's noncompliance with other medical treatment and regimen due to unspecified reason: Secondary | ICD-10-CM

## 2024-02-25 NOTE — BH Specialist Note (Signed)
 [facility] Collaborative Care Clinician attempted to connect with patient for scheduled appointment in office. Patient did not show up for the appointment. After waiting 15 minutes, clinician attempted to reach pt by phone to offer virtual visit.   Pt did not answer phone and clinician left HIPAA compliant voicemail for pt to call office to reschedule appointment.    Clinician spent total of 15 minutes attempting to connect with patient for scheduled visit.

## 2024-03-01 ENCOUNTER — Telehealth: Payer: Self-pay | Admitting: Licensed Clinical Social Worker

## 2024-03-01 NOTE — Telephone Encounter (Signed)
 IBH clinician follow up phone call--LMVM

## 2024-04-04 ENCOUNTER — Other Ambulatory Visit: Payer: Self-pay | Admitting: Internal Medicine

## 2024-04-10 NOTE — Progress Notes (Unsigned)
 " Established patient visit  Patient: Valerie Schmitt   DOB: 03-Feb-1966   59 y.o. Female  MRN: [id number] Visit Date: 04/13/2024  Today's healthcare provider: Jolynn Spencer, PA-C   No chief complaint on file.  Subjective       Discussed the use of AI scribe software for clinical note transcription with the patient, who gave verbal consent to proceed.  History of Present Illness     Behavioral recommendations:  Continue medications as prescribed Continue behavioral activation recommendations Referral(s): Integrated Labs 10>=17     01/10/2024   10:08 AM 12/21/2023    8:11 AM  GAD 7 : Generalized Anxiety Score  Nervous, Anxious, on Edge 1  0   Control/stop worrying 1  0   Worry too much - different things 1  0   Trouble relaxing 1  0   Restless 0  0   Easily annoyed or irritable 1  0   Afraid - awful might happen 0  0   Total GAD 7 Score 5 0  Anxiety Difficulty Not difficult at all      Data saved with a previous flowsheet row definition    Medications: Show/hide medication list[1]  Review of Systems  All other systems reviewed and are negative.  All negative Except see HPI   {Insert previous labs (optional):23779} {See past labs  Heme  Chem  Endocrine  Serology  Results Review (optional):1}   Objective    There were no vitals taken for this visit. {Insert last BP/Wt (optional):23777}{See vitals history (optional):1}   Physical Exam Vitals reviewed.  Constitutional:      General: She is not in acute distress.    Appearance: Normal appearance. She is well-developed. She is not diaphoretic.  HENT:     Head: Normocephalic and atraumatic.  Eyes:     General: No scleral icterus.    Conjunctiva/sclera: Conjunctivae normal.  Neck:     Thyroid: No thyromegaly.  Cardiovascular:     Rate and Rhythm: Normal rate and regular rhythm.     Pulses: Normal pulses.     Heart sounds: Normal heart sounds. No murmur heard. Pulmonary:     Effort:  Pulmonary effort is normal. No respiratory distress.     Breath sounds: Normal breath sounds. No wheezing, rhonchi or rales.  Musculoskeletal:     Cervical back: Neck supple.     Right lower leg: No edema.     Left lower leg: No edema.  Lymphadenopathy:     Cervical: No cervical adenopathy.  Skin:    General: Skin is warm and dry.     Findings: No rash.  Neurological:     Mental Status: She is alert and oriented to person, place, and time. Mental status is at baseline.  Psychiatric:        Mood and Affect: Mood normal.        Behavior: Behavior normal.      No results found for any visits on 04/13/24.      Assessment and Plan Assessment & Plan     No orders of the defined types were placed in this encounter.   No follow-ups on file.   The patient was advised to call back or seek an in-person evaluation if the symptoms worsen or if the condition fails to improve as anticipated.  I discussed the assessment and treatment plan with the patient. The patient was provided an opportunity to ask questions and all were answered. The patient agreed with the plan and  demonstrated an understanding of the instructions.  I, Kaid Seeberger, PA-C have reviewed all documentation for this visit. The documentation on 04/13/2024  for the exam, diagnosis, procedures, and orders are all accurate and complete.  Jolynn Spencer, [facility], MMS [facility] [phone] (phone) [phone] (fax)  [facility]    [1]  Outpatient Medications Prior to Visit  Medication Sig   aspirin EC 81 MG tablet Take 81 mg by mouth daily. Swallow whole.   Blood Glucose Monitoring Suppl (ONETOUCH ULTRALINK) w/Device KIT by Does not apply route 3 (three) times daily.   DULoxetine  (CYMBALTA ) 60 MG capsule TAKE 2 CAPSULES BY MOUTH EVERY DAY   empagliflozin  (JARDIANCE ) 25 MG TABS tablet Take 1 tablet (25 mg total) by mouth daily.   Insulin  Glargine (BASAGLAR  KWIKPEN) 100 UNIT/ML Inject 10  Units into the skin daily.   Insulin  Glargine-aglr (REZVOGLAR  KWIKPEN) 100 UNIT/ML SOPN Inject 20 Units into the skin daily in the afternoon.   insulin  lispro (HUMALOG  KWIKPEN) 200 UNIT/ML KwikPen Max daily 15 units with correction scale   Insulin  Pen Needle 31G X 5 MM MISC 1 Device by Does not apply route in the morning, at noon, in the evening, and at bedtime.   losartan  (COZAAR ) 25 MG tablet Take 1 tablet (25 mg total) by mouth daily.   metFORMIN  (GLUCOPHAGE ) 1000 MG tablet Take 1 tablet (1,000 mg total) by mouth 2 (two) times daily with a meal.   rizatriptan  (MAXALT -MLT) 10 MG disintegrating tablet TAKE 1 TABLET BY MOUTH AS NEEDED FOR MIGRAINE. MAY REPEAT IN 2 HOURS IF NEEDED   rosuvastatin  (CRESTOR ) 20 MG tablet Take 1 tablet (20 mg total) by mouth daily.   SYNTHROID  88 MCG tablet TAKE 1 TABLET BY MOUTH EVERY DAY BEFORE BREAKFAST   tirzepatide  (MOUNJARO ) 12.5 MG/0.5ML Pen Inject 12.5 mg into the skin once a week.   traZODone  (DESYREL ) 150 MG tablet Take 1 tablet (150 mg total) by mouth at bedtime.   No facility-administered medications prior to visit.   "

## 2024-04-11 ENCOUNTER — Other Ambulatory Visit: Payer: Self-pay | Admitting: Physician Assistant

## 2024-04-11 DIAGNOSIS — F5101 Primary insomnia: Secondary | ICD-10-CM

## 2024-04-13 ENCOUNTER — Ambulatory Visit: Admitting: Physician Assistant

## 2024-04-13 DIAGNOSIS — E1169 Type 2 diabetes mellitus with other specified complication: Secondary | ICD-10-CM

## 2024-04-13 DIAGNOSIS — F5101 Primary insomnia: Secondary | ICD-10-CM

## 2024-04-13 DIAGNOSIS — E1165 Type 2 diabetes mellitus with hyperglycemia: Secondary | ICD-10-CM

## 2024-04-13 DIAGNOSIS — E039 Hypothyroidism, unspecified: Secondary | ICD-10-CM

## 2024-04-15 ENCOUNTER — Other Ambulatory Visit: Payer: Self-pay | Admitting: Physician Assistant

## 2024-04-15 DIAGNOSIS — F339 Major depressive disorder, recurrent, unspecified: Secondary | ICD-10-CM

## 2024-08-09 ENCOUNTER — Ambulatory Visit: Admitting: Internal Medicine
